# Patient Record
Sex: Male | Born: 1945
Health system: Southern US, Community
[De-identification: ages and names within clinical notes are randomized; demographics above are authoritative.]

## PROBLEM LIST (undated history)

## (undated) DIAGNOSIS — C4491 Basal cell carcinoma of skin, unspecified: Secondary | ICD-10-CM

## (undated) DIAGNOSIS — K5790 Diverticulosis of intestine, part unspecified, without perforation or abscess without bleeding: Secondary | ICD-10-CM

## (undated) DIAGNOSIS — R040 Epistaxis: Secondary | ICD-10-CM

## (undated) DIAGNOSIS — M1990 Unspecified osteoarthritis, unspecified site: Secondary | ICD-10-CM

## (undated) DIAGNOSIS — I10 Essential (primary) hypertension: Secondary | ICD-10-CM

## (undated) DIAGNOSIS — C61 Malignant neoplasm of prostate: Secondary | ICD-10-CM

## (undated) DIAGNOSIS — N200 Calculus of kidney: Secondary | ICD-10-CM

## (undated) DIAGNOSIS — K469 Unspecified abdominal hernia without obstruction or gangrene: Secondary | ICD-10-CM

## (undated) DIAGNOSIS — G459 Transient cerebral ischemic attack, unspecified: Secondary | ICD-10-CM

## (undated) DIAGNOSIS — E7800 Pure hypercholesterolemia, unspecified: Secondary | ICD-10-CM

## (undated) HISTORY — DX: Essential (primary) hypertension: I10

## (undated) HISTORY — DX: Calculus of kidney: N20.0

## (undated) HISTORY — DX: Diverticulosis of intestine, part unspecified, without perforation or abscess without bleeding: K57.90

## (undated) HISTORY — DX: Pure hypercholesterolemia, unspecified: E78.00

## (undated) HISTORY — DX: Malignant neoplasm of prostate: C61

---

## 1992-10-19 HISTORY — PX: CYSTOSCOPY W/ STONE MANIPULATION: SHX1427

## 1993-10-19 HISTORY — PX: KIDNEY STONE SURGERY: SHX686

## 2002-01-06 ENCOUNTER — Emergency Department (HOSPITAL_COMMUNITY): Admission: EM | Admit: 2002-01-06 | Discharge: 2002-01-06 | Payer: Self-pay

## 2003-10-20 DIAGNOSIS — G459 Transient cerebral ischemic attack, unspecified: Secondary | ICD-10-CM

## 2003-10-20 HISTORY — DX: Transient cerebral ischemic attack, unspecified: G45.9

## 2008-08-23 ENCOUNTER — Ambulatory Visit: Payer: Self-pay

## 2008-09-03 ENCOUNTER — Ambulatory Visit: Payer: Self-pay

## 2008-09-03 ENCOUNTER — Encounter: Payer: Self-pay | Admitting: Family Medicine

## 2008-09-03 ENCOUNTER — Ambulatory Visit: Payer: Self-pay | Admitting: Surgery

## 2008-10-19 DIAGNOSIS — C61 Malignant neoplasm of prostate: Secondary | ICD-10-CM

## 2008-10-19 HISTORY — DX: Malignant neoplasm of prostate: C61

## 2009-01-07 ENCOUNTER — Ambulatory Visit: Payer: Self-pay | Admitting: Gastroenterology

## 2009-02-04 ENCOUNTER — Ambulatory Visit: Payer: Self-pay | Admitting: Gastroenterology

## 2009-02-04 ENCOUNTER — Encounter: Payer: Self-pay | Admitting: Gastroenterology

## 2009-02-07 ENCOUNTER — Encounter: Payer: Self-pay | Admitting: Gastroenterology

## 2009-09-18 HISTORY — PX: BLADDER STONE REMOVAL: SHX568

## 2009-10-04 ENCOUNTER — Ambulatory Visit (HOSPITAL_COMMUNITY): Admission: RE | Admit: 2009-10-04 | Discharge: 2009-10-04 | Payer: Self-pay | Admitting: Urology

## 2009-10-19 HISTORY — PX: VIDEO ASSISTED THORACOSCOPY (VATS)/WEDGE RESECTION: SHX6174

## 2009-10-23 ENCOUNTER — Ambulatory Visit (HOSPITAL_COMMUNITY): Admission: RE | Admit: 2009-10-23 | Discharge: 2009-10-23 | Payer: Self-pay | Admitting: Urology

## 2009-10-24 ENCOUNTER — Ambulatory Visit: Payer: Self-pay | Admitting: Thoracic Surgery

## 2009-10-28 ENCOUNTER — Ambulatory Visit: Admission: RE | Admit: 2009-10-28 | Discharge: 2009-10-28 | Payer: Self-pay | Admitting: Thoracic Surgery

## 2009-11-01 ENCOUNTER — Encounter: Payer: Self-pay | Admitting: Thoracic Surgery

## 2009-11-01 ENCOUNTER — Inpatient Hospital Stay (HOSPITAL_COMMUNITY): Admission: RE | Admit: 2009-11-01 | Discharge: 2009-11-05 | Payer: Self-pay | Admitting: Thoracic Surgery

## 2009-11-01 ENCOUNTER — Ambulatory Visit: Payer: Self-pay | Admitting: Thoracic Surgery

## 2009-11-12 ENCOUNTER — Encounter: Admission: RE | Admit: 2009-11-12 | Discharge: 2009-11-12 | Payer: Self-pay | Admitting: Thoracic Surgery

## 2009-11-12 ENCOUNTER — Ambulatory Visit: Payer: Self-pay | Admitting: Thoracic Surgery

## 2009-12-03 ENCOUNTER — Encounter: Admission: RE | Admit: 2009-12-03 | Discharge: 2009-12-03 | Payer: Self-pay | Admitting: Thoracic Surgery

## 2009-12-03 ENCOUNTER — Ambulatory Visit: Payer: Self-pay | Admitting: Thoracic Surgery

## 2010-01-14 ENCOUNTER — Ambulatory Visit: Payer: Self-pay | Admitting: Thoracic Surgery

## 2010-01-14 ENCOUNTER — Encounter: Admission: RE | Admit: 2010-01-14 | Discharge: 2010-01-14 | Payer: Self-pay | Admitting: Thoracic Surgery

## 2010-01-17 HISTORY — PX: PROSTATECTOMY: SHX69

## 2010-01-22 ENCOUNTER — Inpatient Hospital Stay (HOSPITAL_COMMUNITY): Admission: RE | Admit: 2010-01-22 | Discharge: 2010-01-23 | Payer: Self-pay | Admitting: Urology

## 2010-01-22 ENCOUNTER — Encounter (INDEPENDENT_AMBULATORY_CARE_PROVIDER_SITE_OTHER): Payer: Self-pay | Admitting: Urology

## 2010-04-28 ENCOUNTER — Ambulatory Visit: Payer: Self-pay | Admitting: Thoracic Surgery

## 2010-04-28 ENCOUNTER — Encounter: Admission: RE | Admit: 2010-04-28 | Discharge: 2010-04-28 | Payer: Self-pay | Admitting: Thoracic Surgery

## 2010-11-09 ENCOUNTER — Encounter: Payer: Self-pay | Admitting: Thoracic Surgery

## 2011-01-04 LAB — BASIC METABOLIC PANEL
BUN: 15 mg/dL (ref 6–23)
GFR calc non Af Amer: >60 mL/min (ref >60)
Sodium: 132 mEq/L — ABNORMAL LOW (ref 135–145)

## 2011-01-04 LAB — TYPE AND SCREEN: ABO/RH(D): O POS

## 2011-01-04 LAB — BLOOD GAS, ARTERIAL
O2 Saturation: 92.4 %
pCO2 arterial: 35.8 mmHg (ref 35.0–45.0)

## 2011-01-04 LAB — PROTIME-INR
INR: 0.95 (ref 0.00–1.49)
Prothrombin Time: 12.6 seconds (ref 11.6–15.2)

## 2011-01-04 LAB — CBC
HCT: 39.8 % (ref 39.0–52.0)
MCHC: 34.3 g/dL (ref 30.0–36.0)
MCHC: 34.3 g/dL (ref 30.0–36.0)
MCV: 96.1 fL (ref 78.0–100.0)
RBC: 4.14 MIL/uL — ABNORMAL LOW (ref 4.22–5.81)
RDW: 13.6 % (ref 11.5–15.5)
RDW: 13.7 % (ref 11.5–15.5)
WBC: 7.2 10*3/uL (ref 4.0–10.5)

## 2011-01-04 LAB — COMPREHENSIVE METABOLIC PANEL
ALT: 22 U/L (ref 0–53)
AST: 21 U/L (ref 0–37)
Albumin: 4 g/dL (ref 3.5–5.2)
CO2: 27 mEq/L (ref 19–32)
Calcium: 10.2 mg/dL (ref 8.4–10.5)
Chloride: 102 mEq/L (ref 96–112)
GFR calc non Af Amer: >60 mL/min (ref >60)
Potassium: 4.5 mEq/L (ref 3.5–5.1)
Total Bilirubin: 0.3 mg/dL (ref 0.3–1.2)
Total Protein: 7.1 g/dL (ref 6.0–8.3)

## 2011-01-04 LAB — POCT I-STAT 3, ART BLOOD GAS (G3+)
Bicarbonate: 24.8 mEq/L — ABNORMAL HIGH (ref 20.0–24.0)
O2 Saturation: 93 %
pH, Arterial: 7.353 (ref 7.350–7.450)

## 2011-01-04 LAB — GLUCOSE, CAPILLARY: Glucose-Capillary: 100 mg/dL — ABNORMAL HIGH (ref 70–99)

## 2011-01-04 LAB — URINALYSIS, ROUTINE W REFLEX MICROSCOPIC
Hgb urine dipstick: NEGATIVE
Specific Gravity, Urine: 1.021 (ref 1.005–1.030)

## 2011-01-05 LAB — CBC
HCT: 33.6 % — ABNORMAL LOW (ref 39.0–52.0)
Platelets: 116 10*3/uL — ABNORMAL LOW (ref 150–400)
RBC: 3.54 MIL/uL — ABNORMAL LOW (ref 4.22–5.81)
RDW: 13.7 % (ref 11.5–15.5)
WBC: 10.1 10*3/uL (ref 4.0–10.5)

## 2011-01-05 LAB — COMPREHENSIVE METABOLIC PANEL
AST: 31 U/L (ref 0–37)
Albumin: 3 g/dL — ABNORMAL LOW (ref 3.5–5.2)
Alkaline Phosphatase: 56 U/L (ref 39–117)
Total Bilirubin: 0.5 mg/dL (ref 0.3–1.2)
Total Protein: 5.3 g/dL — ABNORMAL LOW (ref 6.0–8.3)

## 2011-01-05 LAB — BASIC METABOLIC PANEL
Calcium: 8.8 mg/dL (ref 8.4–10.5)
Creatinine, Ser: 1.09 mg/dL (ref 0.4–1.5)
GFR calc Af Amer: >60 mL/min (ref >60)
Sodium: 132 mEq/L — ABNORMAL LOW (ref 135–145)

## 2011-01-07 LAB — CBC
Hemoglobin: 13.9 g/dL (ref 13.0–17.0)
MCHC: 33.7 g/dL (ref 30.0–36.0)
MCV: 97.6 fL (ref 78.0–100.0)
RBC: 4.21 MIL/uL — ABNORMAL LOW (ref 4.22–5.81)
WBC: 9.1 10*3/uL (ref 4.0–10.5)

## 2011-01-07 LAB — BASIC METABOLIC PANEL
CO2: 28 mEq/L (ref 19–32)
Chloride: 108 mEq/L (ref 96–112)
GFR calc Af Amer: >60 mL/min (ref >60)
Potassium: 4 mEq/L (ref 3.5–5.1)
Sodium: 140 mEq/L (ref 135–145)

## 2011-01-07 LAB — HEMOGLOBIN AND HEMATOCRIT, BLOOD
HCT: 37.1 % — ABNORMAL LOW (ref 39.0–52.0)
HCT: 41 % (ref 39.0–52.0)
Hemoglobin: 13.8 g/dL (ref 13.0–17.0)

## 2011-01-07 LAB — TYPE AND SCREEN

## 2011-01-19 LAB — CBC
MCHC: 33.2 g/dL (ref 30.0–36.0)
Platelets: 231 10*3/uL (ref 150–400)
RBC: 4.88 MIL/uL (ref 4.22–5.81)
WBC: 8.9 10*3/uL (ref 4.0–10.5)

## 2011-01-19 LAB — PROTIME-INR
INR: 1.01 (ref 0.00–1.49)
Prothrombin Time: 13.2 seconds (ref 11.6–15.2)

## 2011-01-19 LAB — BASIC METABOLIC PANEL
BUN: 16 mg/dL (ref 6–23)
CO2: 27 mEq/L (ref 19–32)
Calcium: 9.8 mg/dL (ref 8.4–10.5)
Creatinine, Ser: 0.84 mg/dL (ref 0.4–1.5)
GFR calc Af Amer: >60 mL/min (ref >60)

## 2011-03-03 NOTE — Letter (Signed)
April 28, 2010     Re:  Timothy Duran, SIMMER                  DOB:  25-Oct-1945     The patient came for final followup today.  His blood pressure was  118/76, pulse 75, respirations 18, sats were 95%.  He is doing well  overall.  His chest x-Damacio showed normal postoperative changes.  We  removed his hamartoma.  I have released him back to his medical doctor.  We will see him again if he has any future problems.   Ines Bloomer, M.D.  Electronically Signed   DPB/MEDQ  D:  04/28/2010  T:  04/29/2010  Job:  [id number]

## 2011-03-03 NOTE — Assessment & Plan Note (Signed)
OFFICE VISIT   Timothy Duran, Timothy Duran  DOB:  May 17, 1946                                       09/03/2008  ZOXWR#:[id number]   REASON FOR VISIT:  TIA, leg pain.   HISTORY:  This is a 65 year old gentleman I am seeing for evaluation of  leg pain and a history of TIA.  The patient states that on October 25th  he had an episode where he came dizzy, both eyes went blurry and he lost  his vision.  He described his surroundings as spinning.  He developed  nausea and was diaphoretic.  This lasted for 1-2 minutes and then  resolved.  The patient states that he had a CT scan that showed that he  had a stroke, the timing of which could not be determined.   The patient also states that he has leg pain.  He describes his leg pain  as occurring at rest.  It is alleviated with walking.   The patient has a history of hypertension and hypercholesterolemia, both  which he is taking medications for.  He is also taking Plavix.   REVIEW OF SYSTEMS:  GENERAL:  Positive for weight gain.  CARDIAC:  Negative.  PULMONARY:  Negative.  GI:  Negative.  GU:  Positive for burning with urination and frequent urination.  VASCULAR:  History of mini-stroke.  NEURO:  Positive for dizziness.  ORTHO:  Positive for joint pain.  PSYCH:  Negative.  ENT:  Negative.  HEME:  Negative.   PAST MEDICAL HISTORY:  Hypertension, hypercholesterolemia, history of  kidney stone.   FAMILY HISTORY:  Negative for cardiovascular disease at a young age.   SOCIAL HISTORY:  He is married with 2 children, he is retired.  He  smokes approximately 1/2 pack a day.  He does not drink alcohol.   MEDICATIONS:  Plavix, Azor and aspirin.   ALLERGIES:  Penicillin.   PHYSICAL EXAMINATION:  Vital Signs:  Blood pressure is 134/81, heart  rate 84.  General:  He is well-appearing, no distress.  HEENT:  Normocephalic, atraumatic.  Pupils equal, round and reactive.  Neck:  Supple.  No JVD.  No carotid bruits.  Cardiovascular:   Regular rate and  rhythm.  No murmurs, rubs or gallops.  Pulmonary:  Lungs are clear  bilaterally.  Abdomen:  Soft, nontender.  No pulsatile mass.  Extremities:  Are warm and well-perfused.  He has palpable pedal pulses.  Neuro:  Cranial nerves II-XII are grossly intact.  Psych:  He is alert  and oriented x3.   DIAGNOSTIC STUDIES:  The patient had ankle brachial indices performed  today.  This reveals an ankle brachial index of 1.0 on the left and 1.0  on the right.  I have also reviewed his carotid Doppler study, which was  faxed to me from [place], which reveals 0-39% carotid disease  bilaterally.   ASSESSMENT/PLAN:  Status post transient ischemic attack and leg pain.   Plan:  I discussed with the patient that by Doppler studies he has no  evidence of arterial insufficiency in his legs and he has no evidence of  carotid disease.  The logy of his stroke is most likely hypertension.  We discussed treating this medically.  I also told him that his leg pain  was not related to peripheral artery disease as he has palpable pedal  pulses and  a normal ankle brachial indices.   Jorge Ny, MD  Electronically Signed   VWB/MEDQ  D:  09/03/2008  T:  09/04/2008  Job:  [id number]   cc:   Bennie Pierini, P.A.

## 2011-03-03 NOTE — Assessment & Plan Note (Signed)
OFFICE VISIT   Timothy Duran, Timothy Duran  DOB:  October 04, 1946                                        January 14, 2010  CHART #:  [id number]   The patient came for followup today.  In January, he had resection of a  right lung nodule that turned out to be a pulmonary hamartoma.  His  chest x-Amarri still shows reaction where his nodule was released, but it  appears to be improving.  He is going to have a robotic prostatectomy  tomorrow by Dr. Heloise Purpura.  His blood pressure was 132/71, pulse 68,  respirations 18, and sats were 95%.   Timothy Duran, M.D.  Electronically Signed   DPB/MEDQ  D:  01/14/2010  T:  01/15/2010  Job:  [id number]

## 2011-03-03 NOTE — Letter (Signed)
November 12, 2009   Courtney Paris, MD  [phone] [address]  [place], [place] [address]   Re:  FOCH, ROSENWALD                  DOB:  [birth year]   Dear Londell Moh:   I saw the patient back today after resecting his right upper lobe  lesion.  He had some hematoma in his chest wall, but otherwise he was  doing well.  His chest x-Matis was normal.  He had a hamartoma, which we  were able to resect without any problems.  We gave him a refill for  Percocet 5/325, #60.  His blood pressure was 100/50, pulse 88,  respirations 18, and sats were 97%.  I will see him back again in 2  weeks with another chest x-Jennifer.   Sincerely,   Ines Bloomer, M.D.  Electronically Signed   DPB/MEDQ  D:  11/12/2009  T:  11/13/2009  Job:  [id number]   cc:   Ernestina Penna, M.D.

## 2011-03-03 NOTE — Assessment & Plan Note (Signed)
OFFICE VISIT   Timothy Duran, Timothy Duran  DOB:  [birth year]                                        April 28, 2010  CHART #:  [id number]   The patient came for final followup today.  His blood pressure was  118/76, pulse 75, respirations 18, sats were 95%.  He is doing well  overall.  His chest x-Earon showed normal postoperative changes.  We  removed his hamartoma.  I have released him back to his medical doctor.  We will see him again if he has any future problems.   Ines Bloomer, M.D.  Electronically Signed   DPB/MEDQ  D:  04/28/2010  T:  05/02/2010  Job:  [id number]

## 2011-03-03 NOTE — Letter (Signed)
October 24, 2009   Courtney Paris, MD  [phone]  [place], [place] [address]   Re:  Timothy Duran, Timothy Duran              DOB:  12-03-1945   Dear Londell Moh:   I appreciate the opportunity of seeing the patient.  This 65 year old  smoker was found to have an elevated PSA and also had found to have a 2-  cm bladder stone.  He underwent workup and was found to have prostate  cancer, Gleason's 3+4 adenocarcinoma of the prostate.  He is a long-time  smoker.  He has had no hemoptysis, fever, chills, or excessive sputum.  On the CT scan, there was a 2.2-cm lesion in the right upper lobe that  was noncalcific.  PIPIDA scan was done which showed an uptake of 1.6  which is more in the benign side or low grade cancer that was thought to  be possibly a hamartoma.  A x-Daved taken in February 2010 shows this  lesion in the right upper lobe.  This was done at [facility].   ALLERGIES:  He is allergic to penicillin.   MEDICATIONS:  He takes Plavix 75 mg a day and aspirin 81 mg a day.  He  was on Azor.   PAST MEDICAL HISTORY:  He has hypertension.   FAMILY HISTORY:  Noncontributory.   SOCIAL HISTORY:  He is married.  He has 2 children, comes in today with  his wife and his children.  He has been married for 46 years.  He quit  smoking in December 2010.  Does not drink alcohol on a regular basis.   REVIEW OF SYSTEMS:  VITAL SIGNS:  He is 5 feet 8 inches, 172 pounds.  GENERAL:  His weight has been stable.  CARDIAC:  No angina or atrial fibrillation.  PULMONARY:  No hemoptysis, fever, chills, or excessive sputum.  GI:  No nausea, vomiting, constipation, or diarrhea.  GU:  See history of present illness.  VASCULAR:  He had a previous TIA and has put on Plavix.  No claudication  or DVT.  NEUROLOGICAL:  No dizziness, headaches, blackout, or seizures.  MUSCULOSKELETAL:  Arthritis.  PSYCHIATRIC:  No depression or nervous.  EYE/ENT:  No changes in eyesight or hearing.  HEMATOLOGICAL:  No problems with bleeding, clotting disorders, or  anemia.   PHYSICAL EXAMINATION:  Vital Signs:  His blood pressure is 130/86, pulse  84, respirations 18, and sats were 96%.  Head, Eyes, Ears, Nose, and  Throat:  Unremarkable.  Neck:  Supple without thyromegaly.  There is no  supraclavicular or axillary adenopathy.  Chest:  Clear to auscultation  and percussion.  Heart:  Regular sinus rhythm.  No murmurs.  Abdomen:  Soft.  There is no hepatosplenomegaly.  Extremities:  Pulses are 2+.  There is no clubbing or edema.  Neurologic:  He is oriented x3.  Sensory  and motor intact.  Cranial nerves intact.   I feel that this is probably a pulmonary hamartoma or it could be a low-  grade cancer such as a carcinoid.  Whatever the case has been, 2.2 size  needs to be removed as there is some evidence of degeneration of  hamartomas at this size.  I have scheduled him for November 01, 2009 for  a right VATS resection of this.  We will get full set of pulmonary  function tests prior to his surgery.  I appreciate the opportunity of  seeing the patient.   Sincerely,   Ines Bloomer, M.D.  Electronically Signed   DPB/MEDQ  D:  10/24/2009  T:  10/25/2009  Job:  [id number]   cc:   Ernestina Penna, M.D.

## 2011-03-03 NOTE — Assessment & Plan Note (Signed)
OFFICE VISIT   Timothy Duran, Timothy Duran  DOB:  [birth year]                                        December 03, 2009  CHART #:  [id number]   HISTORY:  The patient is a 65 year old gentleman who is status post  right video-assisted thoracoscopy with wedge resection of a right upper  lobe lung mass on November 01, 2009, which has subsequently returned a  pathological diagnosis of hamartoma.  He is seen on routine followup on  today's date.  His only significant symptom currently is some mild  numbness in the right anterior chest just below the pectoralis region.  He denies shortness of breath.  He denies fevers, chills, or other  constitutional symptoms.   Chest x-[name] was obtained on today's date.  It shows stable appearance  with no significant new findings.   PHYSICAL EXAMINATION:  Vital Signs:  Blood pressure 118/60, pulse 96,  respirations 16, and oxygen saturation is 95% on room air.  General:  A  well-developed adult male in no acute distress.  Pulmonary:  Clear  breath sounds throughout.  Cardiac:  Regular rate and rhythm.  Chest:  Incision is inspected.  There is some moderate local swelling in the  region of the thoracotomy incision, but no evidence of infection.  There  is no erythema.  No drainage.  It is minimally tender to palpation.   ASSESSMENT:  The patient is making an excellent ongoing recovery.  There  are no significant current issues other than the routine postoperative  changes.  We will need to continue to observe the incision and we will  see the patient again in 6 weeks with a repeat chest x-[name].  At that  time if there are no significant issues, he may be discharged from  further followup, but that is tentative.   [name], P.A.-C.   [name]  D:  12/03/2009  T:  12/04/2009  Job:  [id number]

## 2011-04-20 ENCOUNTER — Other Ambulatory Visit: Payer: Self-pay | Admitting: Diagnostic Neuroimaging

## 2011-04-20 DIAGNOSIS — R42 Dizziness and giddiness: Secondary | ICD-10-CM

## 2011-04-29 ENCOUNTER — Ambulatory Visit
Admission: RE | Admit: 2011-04-29 | Discharge: 2011-04-29 | Disposition: A | Discharge status: Home / Self Care | Payer: Medicare Other | Source: Ambulatory Visit | Attending: Diagnostic Neuroimaging | Admitting: Diagnostic Neuroimaging

## 2011-04-29 DIAGNOSIS — R42 Dizziness and giddiness: Secondary | ICD-10-CM

## 2011-04-29 MED ORDER — GADOBENATE DIMEGLUMINE 529 MG/ML IV SOLN
17.0000 mL | Freq: Once | INTRAVENOUS | Status: AC | PRN
  Administered 2011-04-29: 17 mL via INTRAVENOUS

## 2013-05-17 ENCOUNTER — Other Ambulatory Visit: Payer: Self-pay | Admitting: *Deleted

## 2013-05-17 MED ORDER — OLMESARTAN MEDOXOMIL-HCTZ 20-12.5 MG PO TABS: 1.0000 | ORAL_TABLET | Freq: Every day | ORAL | Status: DC

## 2013-06-12 ENCOUNTER — Other Ambulatory Visit: Payer: Self-pay

## 2013-06-12 ENCOUNTER — Ambulatory Visit (INDEPENDENT_AMBULATORY_CARE_PROVIDER_SITE_OTHER): Payer: Medicare Other

## 2013-06-12 ENCOUNTER — Ambulatory Visit (INDEPENDENT_AMBULATORY_CARE_PROVIDER_SITE_OTHER): Payer: Medicare Other | Admitting: Family Medicine

## 2013-06-12 VITALS — BP 123/78 | HR 93 | Temp 97.6°F | Ht 68.0 in | Wt 186.0 lb

## 2013-06-12 DIAGNOSIS — Z85118 Personal history of other malignant neoplasm of bronchus and lung: Secondary | ICD-10-CM

## 2013-06-12 DIAGNOSIS — D171 Benign lipomatous neoplasm of skin and subcutaneous tissue of trunk: Secondary | ICD-10-CM

## 2013-06-12 DIAGNOSIS — D1779 Benign lipomatous neoplasm of other sites: Secondary | ICD-10-CM

## 2013-06-12 NOTE — Patient Instructions (Signed)
Lipoma A lipoma is a noncancerous (benign) tumor composed of fat cells. They are usually found under the skin (subcutaneous). A lipoma may occur in any tissue of the body that contains fat. Common areas for lipomas to appear include the back, shoulders, buttocks, and thighs. Lipomas are a very common soft tissue growth. They are soft and grow slowly. Most problems caused by a lipoma depend on where it is growing. DIAGNOSIS  A lipoma can be diagnosed with a physical exam. These tumors rarely become cancerous, but radiographic studies can help determine this for certain. Studies used may include:  Computerized X-Gibran scans (CT or CAT scan).  Computerized magnetic scans (MRI). TREATMENT  Small lipomas that are not causing problems may be watched. If a lipoma continues to enlarge or causes problems, removal is often the best treatment. Lipomas can also be removed to improve appearance. Surgery is done to remove the fatty cells and the surrounding capsule. Most often, this is done with medicine that numbs the area (local anesthetic). The removed tissue is examined under a microscope to make sure it is not cancerous. Keep all follow-up appointments with your caregiver. SEEK MEDICAL CARE IF:   The lipoma becomes larger or hard.  The lipoma becomes painful, red, or increasingly swollen. These could be signs of infection or a more serious condition. Document Released: 09/25/2002 Document Revised: 12/28/2011 Document Reviewed: 03/07/2010 [employer] Patient Information 2014 [employer].  

## 2013-06-12 NOTE — Progress Notes (Signed)
  Subjective:    Patient ID: Timothy Duran, male    DOB: 03/24/1946, 67 y.o.   MRN: [id number]  HPI This 67 y.o. male presents for evaluation of mass on his right shoulder.  He has had this for  A few months or at least his wife has noticed this for over a few months now.  There is no pain. He has hx of lung mass that was surgically removed and was benign.    Review of Systems C/o mass right posterior shoulder. No chest pain, SOB, HA, dizziness, vision change, N/V, diarrhea, constipation, dysuria, urinary urgency or frequency, myalgias, arthralgias or rash.     Objective:   Physical Exam Vital signs noted  Well developed well nourished male.  HEENT - Head atraumatic Normocephalic                Eyes - PERRLA, Conjuctiva - clear Sclera- Clear EOMI                Ears - EAC's Wnl TM's Wnl Gross Hearing WNL                Nose - Nares patent                 Throat - oropharanx wnl Respiratory - Lungs CTA bilateral Cardiac - RRR S1 and S2 without murmur GI - Abdomen soft Nontender and bowel sounds active x 4 Extremities - No edema. Neuro - Grossly intact. Skin - 8cm diameter lipoma mass on posterior right shoulder.    CXR - No masses seen. Assessment & Plan:  Hx of cancer of lung - Plan: DG Chest 2 View  Lipoma of back - Plan: [place] Misc Soft Tissue

## 2013-06-13 ENCOUNTER — Other Ambulatory Visit: Payer: Medicare Other

## 2013-06-13 ENCOUNTER — Ambulatory Visit
Admission: RE | Admit: 2013-06-13 | Discharge: 2013-06-13 | Disposition: A | Discharge status: Home / Self Care | Payer: Medicare Other | Source: Ambulatory Visit | Attending: Family Medicine | Admitting: Family Medicine

## 2013-06-13 DIAGNOSIS — D171 Benign lipomatous neoplasm of skin and subcutaneous tissue of trunk: Secondary | ICD-10-CM

## 2013-06-16 ENCOUNTER — Telehealth: Payer: Self-pay | Admitting: Nurse Practitioner

## 2013-06-16 NOTE — Telephone Encounter (Signed)
Left message that report not ready yet

## 2013-06-16 NOTE — Telephone Encounter (Signed)
Where did have done- no report in computer

## 2013-08-24 ENCOUNTER — Other Ambulatory Visit: Payer: Self-pay

## 2013-08-29 ENCOUNTER — Ambulatory Visit (HOSPITAL_COMMUNITY)
Admission: RE | Admit: 2013-08-29 | Discharge: 2013-08-29 | Disposition: A | Discharge status: Home / Self Care | Payer: Medicare Other | Source: Ambulatory Visit | Attending: Nurse Practitioner | Admitting: Nurse Practitioner

## 2013-08-29 ENCOUNTER — Other Ambulatory Visit: Payer: Self-pay | Admitting: Nurse Practitioner

## 2013-08-29 ENCOUNTER — Other Ambulatory Visit: Payer: Self-pay

## 2013-08-29 ENCOUNTER — Encounter: Payer: Self-pay | Admitting: Nurse Practitioner

## 2013-08-29 ENCOUNTER — Ambulatory Visit (INDEPENDENT_AMBULATORY_CARE_PROVIDER_SITE_OTHER): Payer: Medicare Other | Admitting: Nurse Practitioner

## 2013-08-29 VITALS — BP 130/80 | HR 99 | Temp 98.2°F | Ht 68.0 in | Wt 189.0 lb

## 2013-08-29 DIAGNOSIS — M7989 Other specified soft tissue disorders: Secondary | ICD-10-CM

## 2013-08-29 DIAGNOSIS — M79609 Pain in unspecified limb: Secondary | ICD-10-CM | POA: Insufficient documentation

## 2013-08-29 DIAGNOSIS — R52 Pain, unspecified: Secondary | ICD-10-CM

## 2013-08-29 NOTE — Progress Notes (Signed)
  Subjective:    Patient ID: Timothy Duran, male    DOB: 05-08-1946, 67 y.o.   MRN: [id number]  HPI  Patient in with daughter and wife- c/o knot on left lower leg- noticed last night but he said his shin has been hurting for several days.    Review of Systems  All other systems reviewed and are negative.       Objective:   Physical Exam  Constitutional: He appears well-developed and well-nourished.  Cardiovascular: Normal rate and normal heart sounds.   Pulmonary/Chest: Effort normal and breath sounds normal.  Musculoskeletal:  3cm mildly tender nonimmobile lesion left lateral lower leg.    BP 130/80  Pulse 99  Temp(Src) 98.2 F (36.8 C) (Oral)  Ht 5\' 8"  (1.727 m)  Wt 189 lb (85.73 kg)  BMI 28.74 kg/m2       Assessment & Plan:   1. Nodule of soft tissue    Orders Placed This Encounter  Procedures  . US Venous Img Lower Unilateral Left    Standing Status: Future     Number of Occurrences:      Standing Expiration Date: 10/29/2014    Order Specific Question:  Reason for Exam (SYMPTOM  OR DIAGNOSIS REQUIRED)    Answer:  left lower leg    Order Specific Question:  Preferred imaging location?    Answer:  [facility]   Elevate when sitting Will talk after test results  Mary-Margaret Daphine Deutscher, FNP

## 2013-08-29 NOTE — Addendum Note (Signed)
Addended by: Bennie Pierini on: 08/29/2013 12:35 PM   Modules accepted: Orders

## 2013-08-31 ENCOUNTER — Ambulatory Visit (INDEPENDENT_AMBULATORY_CARE_PROVIDER_SITE_OTHER): Payer: Medicare Other | Admitting: Nurse Practitioner

## 2013-08-31 ENCOUNTER — Encounter: Payer: Self-pay | Admitting: Nurse Practitioner

## 2013-08-31 VITALS — BP 125/77 | HR 68 | Temp 98.0°F | Ht 68.0 in | Wt 188.0 lb

## 2013-08-31 DIAGNOSIS — I10 Essential (primary) hypertension: Secondary | ICD-10-CM | POA: Insufficient documentation

## 2013-08-31 DIAGNOSIS — G459 Transient cerebral ischemic attack, unspecified: Secondary | ICD-10-CM

## 2013-08-31 DIAGNOSIS — Z125 Encounter for screening for malignant neoplasm of prostate: Secondary | ICD-10-CM

## 2013-08-31 MED ORDER — OLMESARTAN MEDOXOMIL-HCTZ 20-12.5 MG PO TABS: 1.0000 | ORAL_TABLET | Freq: Every day | ORAL | Status: DC

## 2013-08-31 MED ORDER — CLOPIDOGREL BISULFATE 75 MG PO TABS: 75.0000 mg | ORAL_TABLET | Freq: Once | ORAL | Status: DC

## 2013-08-31 NOTE — Progress Notes (Signed)
Subjective:    Patient ID: Timothy Duran, male    DOB: 09/28/1946, 67 y.o.   MRN: [id number]  Hypertension This is a chronic problem. The current episode started more than 1 year ago. The problem is unchanged. The problem is controlled. Pertinent negatives include no anxiety, blurred vision, chest pain, headaches, malaise/fatigue, neck pain, orthopnea, palpitations, shortness of breath or sweats. There are no associated agents to hypertension. Risk factors for coronary artery disease include dyslipidemia, family history and male gender. Past treatments include angiotensin blockers and diuretics. The current treatment provides moderate improvement. Compliance problems include diet and exercise.  Hypertensive end-organ damage includes CVA.  Hyperlipidemia This is a chronic problem. The current episode started more than 1 year ago. The problem is uncontrolled. He has no history of diabetes, hypothyroidism or obesity. Pertinent negatives include no chest pain or shortness of breath. Treatments tried: patient was suppose to start on lipitor but never did. Compliance problems include adherence to diet and adherence to exercise.        Review of Systems  Constitutional: Negative for malaise/fatigue.  Eyes: Negative for blurred vision.  Respiratory: Negative for shortness of breath.   Cardiovascular: Negative for chest pain, palpitations and orthopnea.  Musculoskeletal: Negative for neck pain.  Neurological: Negative for headaches.       Objective:   Physical Exam  Constitutional: He is oriented to person, place, and time. He appears well-developed and well-nourished.  HENT:  Head: Normocephalic.  Right Ear: External ear normal.  Left Ear: External ear normal.  Nose: Nose normal.  Mouth/Throat: Oropharynx is clear and moist.  Eyes: EOM are normal. Pupils are equal, round, and reactive to light.  Neck: Normal range of motion. Neck supple. No JVD present. No thyromegaly present.  Cardiovascular:  Normal rate, regular rhythm, normal heart sounds and intact distal pulses.  Exam reveals no gallop and no friction rub.   No murmur heard. Pulmonary/Chest: Effort normal and breath sounds normal. No respiratory distress. He has no wheezes. He has no rales. He exhibits no tenderness.  Abdominal: Soft. Bowel sounds are normal. He exhibits no mass. There is no tenderness.  Genitourinary: Prostate normal and penis normal.  Musculoskeletal: Normal range of motion. He exhibits no edema.  Lymphadenopathy:    He has no cervical adenopathy.  Neurological: He is alert and oriented to person, place, and time. No cranial nerve deficit.  Skin: Skin is warm and dry.  Psychiatric: He has a normal mood and affect. His behavior is normal. Judgment and thought content normal.    BP 125/77  Pulse 68  Temp(Src) 98 F (36.7 C) (Oral)  Ht 5\' 8"  (1.727 m)  Wt 188 lb (85.276 kg)  BMI 28.59 kg/m2       Assessment & Plan:   1. Hypertension   2. TIA (transient ischemic attack)   3. Prostate cancer screening    Orders Placed This Encounter  Procedures  . CMP14+EGFR  . NMR, lipoprofile  . PSA, total and free   Meds ordered this encounter  Medications  . clopidogrel (PLAVIX) 75 MG tablet    Sig: Take 1 tablet (75 mg total) by mouth once.    Dispense:  90 tablet    Refill:  1    Order Specific Question:  Supervising Provider    Answer:  Ernestina Penna [[id number]]  . olmesartan-hydrochlorothiazide (BENICAR HCT) 20-12.5 MG per tablet    Sig: Take 1 tablet by mouth daily.    Dispense:  90 tablet  Refill:  1    Order Specific Question:  Supervising Provider    Answer:  Deborra Medina    Continue all meds Labs pending Diet and exercise encouraged Health maintenance reviewed Follow up in 3 months  Mary-Margaret Daphine Deutscher, FNP

## 2013-08-31 NOTE — Patient Instructions (Signed)
Health Maintenance, Males A healthy lifestyle and preventative care can promote health and wellness.  Maintain regular health, dental, and eye exams.  Eat a healthy diet. Foods like vegetables, fruits, whole grains, low-fat dairy products, and lean protein foods contain the nutrients you need without too many calories. Decrease your intake of foods high in solid fats, added sugars, and salt. Get information about a proper diet from your caregiver, if necessary.  Regular physical exercise is one of the most important things you can do for your health. Most adults should get at least 150 minutes of moderate-intensity exercise (any activity that increases your heart rate and causes you to sweat) each week. In addition, most adults need muscle-strengthening exercises on 2 or more days a week.   Maintain a healthy weight. The body mass index (BMI) is a screening tool to identify possible weight problems. It provides an estimate of body fat based on height and weight. Your caregiver can help determine your BMI, and can help you achieve or maintain a healthy weight. For adults 20 years and older:  A BMI below 18.5 is considered underweight.  A BMI of 18.5 to 24.9 is normal.  A BMI of 25 to 29.9 is considered overweight.  A BMI of 30 and above is considered obese.  Maintain normal blood lipids and cholesterol by exercising and minimizing your intake of saturated fat. Eat a balanced diet with plenty of fruits and vegetables. Blood tests for lipids and cholesterol should begin at age 20 and be repeated every 5 years. If your lipid or cholesterol levels are high, you are over 50, or you are a high risk for heart disease, you may need your cholesterol levels checked more frequently.Ongoing high lipid and cholesterol levels should be treated with medicines, if diet and exercise are not effective.  If you smoke, find out from your caregiver how to quit. If you do not use tobacco, do not start.  Lung  cancer screening is recommended for adults aged 55 80 years who are at high risk for developing lung cancer because of a history of smoking. Yearly low-dose computed tomography (CT) is recommended for people who have at least a 30-pack-year history of smoking and are a current smoker or have quit within the past 15 years. A pack year of smoking is smoking an average of 1 pack of cigarettes a day for 1 year (for example: 1 pack a day for 30 years or 2 packs a day for 15 years). Yearly screening should continue until the smoker has stopped smoking for at least 15 years. Yearly screening should also be stopped for people who develop a health problem that would prevent them from having lung cancer treatment.  If you choose to drink alcohol, do not exceed 2 drinks per day. One drink is considered to be 12 ounces (355 mL) of beer, 5 ounces (148 mL) of wine, or 1.5 ounces (44 mL) of liquor.  Avoid use of street drugs. Do not share needles with anyone. Ask for help if you need support or instructions about stopping the use of drugs.  High blood pressure causes heart disease and increases the risk of stroke. Blood pressure should be checked at least every 1 to 2 years. Ongoing high blood pressure should be treated with medicines if weight loss and exercise are not effective.  If you are 45 to 69 years old, ask your caregiver if you should take aspirin to prevent heart disease.  Diabetes screening involves taking a blood   sample to check your fasting blood sugar level. This should be done once every 3 years, after age 45, if you are within normal weight and without risk factors for diabetes. Testing should be considered at a younger age or be carried out more frequently if you are overweight and have at least 1 risk factor for diabetes.  Colorectal cancer can be detected and often prevented. Most routine colorectal cancer screening begins at the age of 50 and continues through age 75. However, your caregiver may  recommend screening at an earlier age if you have risk factors for colon cancer. On a yearly basis, your caregiver may provide home test kits to check for hidden blood in the stool. Use of a small camera at the end of a tube, to directly examine the colon (sigmoidoscopy or colonoscopy), can detect the earliest forms of colorectal cancer. Talk to your caregiver about this at age 50, when routine screening begins. Direct examination of the colon should be repeated every 5 to 10 years through age 75, unless early forms of pre-cancerous polyps or small growths are found.  Hepatitis C blood testing is recommended for all people born from 1945 through 1965 and any individual with known risks for hepatitis C.  Healthy men should no longer receive prostate-specific antigen (PSA) blood tests as part of routine cancer screening. Consult with your caregiver about prostate cancer screening.  Testicular cancer screening is not recommended for adolescents or adult males who have no symptoms. Screening includes self-exam, caregiver exam, and other screening tests. Consult with your caregiver about any symptoms you have or any concerns you have about testicular cancer.  Practice safe sex. Use condoms and avoid high-risk sexual practices to reduce the spread of sexually transmitted infections (STIs).  Use sunscreen. Apply sunscreen liberally and repeatedly throughout the day. You should seek shade when your shadow is shorter than you. Protect yourself by wearing long sleeves, pants, a wide-brimmed hat, and sunglasses year round, whenever you are outdoors.  Notify your caregiver of new moles or changes in moles, especially if there is a change in shape or color. Also notify your caregiver if a mole is larger than the size of a pencil eraser.  A one-time screening for abdominal aortic aneurysm (AAA) and surgical repair of large AAAs by sound wave imaging (ultrasonography) is recommended for ages 65 to 75 years who are  current or former smokers.  Stay current with your immunizations. Document Released: 04/02/2008 Document Revised: 01/30/2013 Document Reviewed: 03/02/2011 [employer] Patient Information 2014 [employer].  

## 2013-09-02 LAB — CMP14+EGFR
ALT: 16 IU/L (ref 0–44)
AST: 15 IU/L (ref 0–40)
Albumin: 4.3 g/dL (ref 3.6–4.8)
Alkaline Phosphatase: 77 IU/L (ref 39–117)
BUN/Creatinine Ratio: 21 (ref 10–22)
BUN: 20 mg/dL (ref 8–27)
CO2: 28 mmol/L (ref 18–29)
Chloride: 102 mmol/L (ref 97–108)
GFR calc Af Amer: 95 mL/min/{1.73_m2} (ref >59)
Potassium: 5 mmol/L (ref 3.5–5.2)
Total Bilirubin: 0.4 mg/dL (ref 0.0–1.2)

## 2013-09-02 LAB — NMR, LIPOPROFILE
Cholesterol: 219 mg/dL — ABNORMAL HIGH (ref <200)
LDL Particle Number: 2267 nmol/L — ABNORMAL HIGH (ref <1000)
LDL Size: 20.5 nm — ABNORMAL LOW (ref >20.5)
LDLC SERPL CALC-MCNC: 149 mg/dL — ABNORMAL HIGH (ref <100)
LP-IR Score: 52 — ABNORMAL HIGH (ref <=45)

## 2013-09-02 LAB — PSA, TOTAL AND FREE
PSA, Free Pct: >10 %
PSA, Free: 0.01 ng/mL
PSA: <0.1 ng/mL (ref 0.0–4.0)

## 2013-09-04 ENCOUNTER — Other Ambulatory Visit: Payer: Self-pay | Admitting: Nurse Practitioner

## 2013-09-04 MED ORDER — ATORVASTATIN CALCIUM 40 MG PO TABS: 40.0000 mg | ORAL_TABLET | Freq: Every day | ORAL | Status: DC

## 2013-09-18 ENCOUNTER — Other Ambulatory Visit: Payer: Self-pay | Admitting: Nurse Practitioner

## 2013-10-19 HISTORY — PX: BASAL CELL CARCINOMA EXCISION: SHX1214

## 2014-01-31 ENCOUNTER — Ambulatory Visit (INDEPENDENT_AMBULATORY_CARE_PROVIDER_SITE_OTHER): Payer: Medicare Other

## 2014-01-31 ENCOUNTER — Ambulatory Visit (INDEPENDENT_AMBULATORY_CARE_PROVIDER_SITE_OTHER): Payer: Medicare Other | Admitting: Family Medicine

## 2014-01-31 VITALS — BP 121/82 | HR 74 | Temp 96.6°F | Ht 68.0 in | Wt 191.4 lb

## 2014-01-31 DIAGNOSIS — R29898 Other symptoms and signs involving the musculoskeletal system: Secondary | ICD-10-CM

## 2014-01-31 DIAGNOSIS — M542 Cervicalgia: Secondary | ICD-10-CM

## 2014-01-31 MED ORDER — HYDROCODONE-ACETAMINOPHEN 5-325 MG PO TABS: 1.0000 | ORAL_TABLET | Freq: Four times a day (QID) | ORAL | Status: DC | PRN

## 2014-01-31 MED ORDER — METHYLPREDNISOLONE (PAK) 4 MG PO TABS: ORAL_TABLET | ORAL | Status: DC

## 2014-01-31 NOTE — Progress Notes (Signed)
   Subjective:    Patient ID: Timothy Duran, male    DOB: 1946/06/06, 68 y.o.   MRN: [id number]  HPI  This 68 y.o. male presents for evaluation of left neck pain for 2 days.  He has difficulty moving his head And feels like he has a strain in his neck.  He has been having some right shoulder discomfort which is resolved.  He has been having some bilateral leg weakness.    Review of Systems C/o bilateral leg weakness and neck pain.   No chest pain, SOB, HA, dizziness, vision change, N/V, diarrhea, constipation, dysuria, urinary urgency or frequency, myalgias, arthralgias or rash.  Objective:   Physical Exam  Vital signs noted  Well developed well nourished male.  HEENT - Head atraumatic Normocephalic                Eyes - PERRLA, Conjuctiva - clear Sclera- Clear EOMI                Ears - EAC's Wnl TM's Wnl Gross Hearing WNL                Throat - oropharanx wnl Respiratory - Lungs CTA bilateral Cardiac - RRR S1 and S2 without murmur GI - Abdomen soft Nontender and bowel sounds active x 4 MS - TTP cervical spine left cervical paraspinous muscles and decreased ROM  Lumbar spine xray - No fx Cervical spine xray - no FX Prelimnary reading by Timothy Saxon Ece [place],FNP    Assessment & Plan:  Neck pain - Plan: DG Cervical Spine Complete, DG Lumbar Spine 2-3 Views  Leg weakness, bilateral - Plan: DG Cervical Spine Complete, DG Lumbar Spine 2-3 Views  Timothy Penner FNP

## 2014-06-14 ENCOUNTER — Encounter: Payer: Self-pay | Admitting: Nurse Practitioner

## 2014-06-14 ENCOUNTER — Ambulatory Visit (INDEPENDENT_AMBULATORY_CARE_PROVIDER_SITE_OTHER): Payer: Medicare Other | Admitting: Nurse Practitioner

## 2014-06-14 VITALS — BP 130/84 | HR 77 | Temp 97.2°F | Ht 68.0 in | Wt 196.0 lb

## 2014-06-14 DIAGNOSIS — L723 Sebaceous cyst: Secondary | ICD-10-CM

## 2014-06-14 DIAGNOSIS — M2550 Pain in unspecified joint: Secondary | ICD-10-CM

## 2014-06-14 DIAGNOSIS — L089 Local infection of the skin and subcutaneous tissue, unspecified: Secondary | ICD-10-CM

## 2014-06-14 MED ORDER — SULFAMETHOXAZOLE-TMP DS 800-160 MG PO TABS
1.0000 | ORAL_TABLET | Freq: Two times a day (BID) | ORAL | Status: DC
Start: 2014-06-14 — End: 2014-06-22

## 2014-06-14 NOTE — Patient Instructions (Signed)

## 2014-06-14 NOTE — Progress Notes (Signed)
   Subjective:    Patient ID: Timothy Duran, male    DOB: 1946-03-24, 68 y.o.   MRN: [id number]  HPI Patient is having multiply joint pain in bilateral knees, ankles, elbows for several years that is getting worse.  No swelling or redness.  Only takes Tylenol for pain relief.  Has a large red nodule on post upper thoracic region x 1 week that has been draining.   Review of Systems  Constitutional: Negative.   Respiratory: Negative.   Cardiovascular: Negative.   Musculoskeletal: Positive for arthralgias.  Skin:       Large erthymatous area on upper thoracic with drainage   Psychiatric/Behavioral: Negative.        Objective:   Physical Exam  Constitutional: He is oriented to person, place, and time. He appears well-developed and well-nourished.  Cardiovascular: Normal rate, regular rhythm and normal heart sounds.   Pulmonary/Chest: Effort normal and breath sounds normal.  Abdominal: Soft. Bowel sounds are normal.  Musculoskeletal: Normal range of motion. He exhibits no edema and no tenderness.  Neurological: He is alert and oriented to person, place, and time.  Skin: Skin is warm and dry.  4cm x 4 cm erythematous hard nodule with no drainage present  Psychiatric: He has a normal mood and affect. His behavior is normal. Judgment and thought content normal.   BP 130/84  Pulse 77  Temp(Src) 97.2 F (36.2 C) (Oral)  Ht 5\' 8"  (1.727 m)  Wt 196 lb (88.905 kg)  BMI 29.81 kg/m2  Procedure:  lidocaine 1% with epi 2cc local  Cleaned with betadine  #11 blade- copious amounts of yellow excudate as well as cheesy looking material exsanguinated  Packing inserted  tegarderm dressing applied     Assessment & Plan:  1. Joint pain Motrin OTC until labs are back - Arthritis Panel - Lyme Ab/Western Blot Reflex - Rocky mtn spotted fvr abs pnl(IgG+IgM)  2. Infected sebaceous cyst Keep clean and dry Remove 1/4 inch of packing daily Culture pending RTO Monday for recheck -  sulfamethoxazole-trimethoprim (BACTRIM DS) 800-160 MG per tablet; Take 1 tablet by mouth 2 (two) times daily.  Dispense: 20 tablet; Refill: 0  Mary-Margaret Hassell Done, FNP

## 2014-06-15 LAB — ARTHRITIS PANEL
BASOS: 1 %
Basophils Absolute: 0.1 10*3/uL (ref 0.0–0.2)
Eos: 4 %
Eosinophils Absolute: 0.4 10*3/uL (ref 0.0–0.4)
HEMATOCRIT: 45.5 % (ref 37.5–51.0)
HEMOGLOBIN: 15.4 g/dL (ref 12.6–17.7)
Immature Grans (Abs): 0 10*3/uL (ref 0.0–0.1)
Immature Granulocytes: 0 %
LYMPHS: 26 %
Lymphocytes Absolute: 2.5 10*3/uL (ref 0.7–3.1)
MCH: 31.1 pg (ref 26.6–33.0)
MCHC: 33.8 g/dL (ref 31.5–35.7)
MCV: 92 fL (ref 79–97)
MONOS ABS: 0.8 10*3/uL (ref 0.1–0.9)
Monocytes: 8 %
Neutrophils Absolute: 5.8 10*3/uL (ref 1.4–7.0)
Neutrophils Relative %: 61 %
Platelets: 278 10*3/uL (ref 150–379)
RBC: 4.95 x10E6/uL (ref 4.14–5.80)
RDW: 15 % (ref 12.3–15.4)
Rhuematoid fact SerPl-aCnc: 9.8 IU/mL (ref 0.0–13.9)
SED RATE: 10 mm/h (ref 0–30)
Uric Acid: 7.7 mg/dL (ref 3.7–8.6)
WBC: 9.7 10*3/uL (ref 3.4–10.8)

## 2014-06-18 ENCOUNTER — Encounter: Payer: Self-pay | Admitting: Nurse Practitioner

## 2014-06-18 ENCOUNTER — Ambulatory Visit (INDEPENDENT_AMBULATORY_CARE_PROVIDER_SITE_OTHER): Payer: Medicare Other | Admitting: Nurse Practitioner

## 2014-06-18 VITALS — BP 107/61 | HR 96 | Temp 97.2°F | Ht 68.0 in | Wt 197.0 lb

## 2014-06-18 DIAGNOSIS — L089 Local infection of the skin and subcutaneous tissue, unspecified: Secondary | ICD-10-CM

## 2014-06-18 DIAGNOSIS — L723 Sebaceous cyst: Secondary | ICD-10-CM

## 2014-06-18 LAB — ROCKY MTN SPOTTED FVR ABS PNL(IGG+IGM)
RMSF IGM: 0.61 {index} (ref 0.00–0.89)
RMSF IgG: POSITIVE — AB

## 2014-06-18 LAB — ANAEROBIC AND AEROBIC CULTURE

## 2014-06-18 LAB — LYME AB/WESTERN BLOT REFLEX
LYME DISEASE AB, QUANT, IGM: <0.8 index (ref 0.00–0.79)
Lyme IgG/IgM Ab: <0.91 {ISR} (ref 0.00–0.90)

## 2014-06-18 LAB — RMSF, IGG, IFA: RMSF, IGG, IFA: 1:64 {titer} — ABNORMAL HIGH

## 2014-06-18 NOTE — Progress Notes (Signed)
   Subjective:    Patient ID: Timothy Duran, male    DOB: 1946/09/18, 68 y.o.   MRN: [id number]  HPI Patient had a large sebaceous cyst removed last week- Here today for packing change- Wife has been removing a quarter inch of backing daily. Still drainig some.    Review of Systems  All other systems reviewed and are negative.      Objective:   Physical Exam  Constitutional: He is oriented to person, place, and time. He appears well-developed and well-nourished.  Cardiovascular: Normal rate, regular rhythm and normal heart sounds.   Pulmonary/Chest: Effort normal and breath sounds normal.  Neurological: He is alert and oriented to person, place, and time.  Skin: Skin is warm.  Packing removed and changed- still some drainage- yellowish  Psychiatric: He has a normal mood and affect. His behavior is normal. Judgment and thought content normal.    BP 107/61  Pulse 96  Temp(Src) 97.2 F (36.2 C) (Oral)  Ht 5\' 8"  (1.727 m)  Wt 197 lb (89.359 kg)  BMI 29.96 kg/m2       Assessment & Plan:   1. Infected sebaceous cyst    Remove 1/4 inch of packing daily Finish antibiotic Recheck on Friday  Mary-Margaret Hassell Done, FNP

## 2014-06-19 ENCOUNTER — Other Ambulatory Visit: Payer: Self-pay | Admitting: Nurse Practitioner

## 2014-06-19 ENCOUNTER — Telehealth: Payer: Self-pay | Admitting: Nurse Practitioner

## 2014-06-19 MED ORDER — DOXYCYCLINE HYCLATE 100 MG PO TABS: 100.0000 mg | ORAL_TABLET | Freq: Two times a day (BID) | ORAL | Status: DC

## 2014-06-19 NOTE — Telephone Encounter (Signed)
Message copied by Cline Crock on Tue Jun 19, 2014 11:12 AM ------      Message from: Chevis Pretty      Created: Tue Jun 19, 2014  9:59 AM       Culture grew staph- wsa given bactrim- good coverage      (+) RMSF- needs doxycycline- rx sent to pharmacy ------

## 2014-06-20 NOTE — Telephone Encounter (Signed)
Patient aware.

## 2014-06-22 ENCOUNTER — Encounter: Payer: Self-pay | Admitting: Nurse Practitioner

## 2014-06-22 ENCOUNTER — Ambulatory Visit (INDEPENDENT_AMBULATORY_CARE_PROVIDER_SITE_OTHER): Payer: Medicare Other | Admitting: Nurse Practitioner

## 2014-06-22 VITALS — BP 118/72 | HR 102 | Temp 97.1°F | Ht 68.0 in | Wt 197.0 lb

## 2014-06-22 DIAGNOSIS — G459 Transient cerebral ischemic attack, unspecified: Secondary | ICD-10-CM

## 2014-06-22 DIAGNOSIS — Z6829 Body mass index (BMI) 29.0-29.9, adult: Secondary | ICD-10-CM

## 2014-06-22 DIAGNOSIS — Z713 Dietary counseling and surveillance: Secondary | ICD-10-CM

## 2014-06-22 DIAGNOSIS — I10 Essential (primary) hypertension: Secondary | ICD-10-CM

## 2014-06-22 MED ORDER — CLOPIDOGREL BISULFATE 75 MG PO TABS: 75.0000 mg | ORAL_TABLET | Freq: Once | ORAL | Status: DC

## 2014-06-22 NOTE — Patient Instructions (Signed)

## 2014-06-22 NOTE — Progress Notes (Signed)
Subjective:    Patient ID: Timothy Duran, male    DOB: 10-26-1945, 68 y.o.   MRN: [id number]  Patient here today for follow up of chronic medical problems. He is also here for recheck of sebaceous cyst that was drained about 12 days ago. Is currently packed with slight drainage.   Hypertension This is a chronic problem. The current episode started more than 1 year ago. The problem is unchanged. The problem is controlled. Pertinent negatives include no blurred vision, chest pain, headaches, orthopnea, palpitations, peripheral edema or shortness of breath. There are no associated agents to hypertension. Risk factors for coronary artery disease include family history, male gender and obesity. Past treatments include angiotensin blockers and diuretics. The current treatment provides significant improvement. There are no compliance problems.  Hypertensive end-organ damage includes CVA (no residual affects).  Hx TIA Greater then 5 years ago- currently on plavix without any bleeding.   Review of Systems  Eyes: Negative for blurred vision.  Respiratory: Negative for shortness of breath.   Cardiovascular: Negative for chest pain, palpitations and orthopnea.  Neurological: Negative for headaches.       Objective:   Physical Exam  Constitutional: He is oriented to person, place, and time. He appears well-developed and well-nourished.  HENT:  Head: Normocephalic.  Right Ear: External ear normal.  Left Ear: External ear normal.  Nose: Nose normal.  Mouth/Throat: Oropharynx is clear and moist.  Eyes: EOM are normal. Pupils are equal, round, and reactive to light.  Neck: Normal range of motion. Neck supple. No JVD present. No thyromegaly present.  Cardiovascular: Normal rate, regular rhythm, normal heart sounds and intact distal pulses.  Exam reveals no gallop and no friction rub.   No murmur heard. Pulmonary/Chest: Effort normal and breath sounds normal. No respiratory distress. He has no  wheezes. He has no rales. He exhibits no tenderness.  Abdominal: Soft. Bowel sounds are normal. He exhibits no mass. There is no tenderness.  Genitourinary:  Refused prostate rectal exam  Musculoskeletal: Normal range of motion. He exhibits no edema.  Lymphadenopathy:    He has no cervical adenopathy.  Neurological: He is alert and oriented to person, place, and time. No cranial nerve deficit.  Skin: Skin is warm and dry.  Packing change to sebaceous cyst on upper mid back- no sign of infection  Psychiatric: He has a normal mood and affect. His behavior is normal. Judgment and thought content normal.   BP 118/72  Pulse 102  Temp(Src) 97.1 F (36.2 C) (Oral)  Ht _0  (1.727 m)  Wt 197 lb (89.359 kg)  BMI 29.96 kg/m2        Assessment & Plan:   1. Essential hypertension   2. Transient cerebral ischemia, unspecified transient cerebral ischemia type    Orders Placed This Encounter  Procedures  . CMP14+EGFR  . NMR, lipoprofile  . PSA, total and free   Meds ordered this encounter  Medications  . clopidogrel (PLAVIX) 75 MG tablet    Sig: Take 1 tablet (75 mg total) by mouth once.    Dispense:  90 tablet    Refill:  1    Order Specific Question:  Supervising Provider    Answer:  Chipper Herb [[id number]]  hemoccult cards given to patient with directions Remove packing 1/4 inch daily as previously discussed- rto prn Discussed weight management for patient with BMI> 25- will recheck in 3 months Labs pending Health maintenance reviewed Diet and exercise encouraged Continue all meds Follow  up  In 3 months   [place], FNP

## 2014-06-23 LAB — CMP14+EGFR
A/G RATIO: 1.8 (ref 1.1–2.5)
ALT: 16 IU/L (ref 0–44)
AST: 13 IU/L (ref 0–40)
Albumin: 4.2 g/dL (ref 3.5–4.8)
Alkaline Phosphatase: 82 IU/L (ref 39–117)
BILIRUBIN TOTAL: 0.2 mg/dL (ref 0.0–1.2)
BUN / CREAT RATIO: 16 (ref 10–22)
BUN: 18 mg/dL (ref 8–27)
CALCIUM: 10.3 mg/dL — AB (ref 8.6–10.2)
CO2: 23 mmol/L (ref 18–29)
CREATININE: 1.11 mg/dL (ref 0.76–1.27)
Chloride: 100 mmol/L (ref 97–108)
GFR, EST AFRICAN AMERICAN: 77 mL/min/{1.73_m2} (ref >59)
GFR, EST NON AFRICAN AMERICAN: 67 mL/min/{1.73_m2} (ref >59)
GLOBULIN, TOTAL: 2.4 g/dL (ref 1.5–4.5)
Glucose: 95 mg/dL (ref 65–99)
POTASSIUM: 4.4 mmol/L (ref 3.5–5.2)
Sodium: 138 mmol/L (ref 134–144)
Total Protein: 6.6 g/dL (ref 6.0–8.5)

## 2014-06-23 LAB — NMR, LIPOPROFILE
Cholesterol: 219 mg/dL — ABNORMAL HIGH (ref 100–199)
HDL CHOLESTEROL BY NMR: 32 mg/dL — AB (ref >39)
HDL PARTICLE NUMBER: 26.5 umol/L — AB (ref >=30.5)
LDL Particle Number: 1850 nmol/L — ABNORMAL HIGH (ref <1000)
LDL SIZE: 20.2 nm (ref >20.5)
LDLC SERPL CALC-MCNC: 121 mg/dL — ABNORMAL HIGH (ref 0–99)
LP-IR Score: 92 — ABNORMAL HIGH (ref <=45)
Small LDL Particle Number: 1077 nmol/L — ABNORMAL HIGH (ref <=527)
Triglycerides by NMR: 332 mg/dL — ABNORMAL HIGH (ref 0–149)

## 2014-06-23 LAB — PSA, TOTAL AND FREE
PSA, Free Pct: >10 %
PSA, Free: 0.01 ng/mL
PSA: <0.1 ng/mL (ref 0.0–4.0)

## 2014-07-02 ENCOUNTER — Other Ambulatory Visit: Payer: Medicare Other

## 2014-07-02 DIAGNOSIS — Z1212 Encounter for screening for malignant neoplasm of rectum: Secondary | ICD-10-CM

## 2014-07-02 NOTE — Progress Notes (Signed)
Lab only 

## 2014-07-03 LAB — FECAL OCCULT BLOOD, IMMUNOCHEMICAL: Fecal Occult Bld: NEGATIVE

## 2014-07-16 ENCOUNTER — Telehealth: Payer: Self-pay | Admitting: Family Medicine

## 2014-07-16 NOTE — Telephone Encounter (Signed)
Appointment made

## 2014-07-16 NOTE — Telephone Encounter (Signed)
All the packing feel out of his cyst does he need to come back in he said you told him to call?

## 2014-07-16 NOTE — Telephone Encounter (Signed)
May want so=meone to just take a look at it.

## 2014-07-18 ENCOUNTER — Ambulatory Visit (INDEPENDENT_AMBULATORY_CARE_PROVIDER_SITE_OTHER): Payer: Medicare Other | Admitting: Family Medicine

## 2014-07-18 ENCOUNTER — Encounter: Payer: Self-pay | Admitting: Family Medicine

## 2014-07-18 VITALS — BP 121/67 | HR 90 | Temp 97.2°F | Ht 68.0 in | Wt 196.0 lb

## 2014-07-18 DIAGNOSIS — L989 Disorder of the skin and subcutaneous tissue, unspecified: Secondary | ICD-10-CM

## 2014-07-19 NOTE — Progress Notes (Signed)
   Subjective:    Patient ID: Timothy Duran, male    DOB: Jun 16, 1946, 68 y.o.   MRN: [id number]  HPI This 68 y.o. male presents for evaluation of follow up on sebaceous cyst on back that was I&D andhe is here for follow up.  He has a skin lesion on his right forearm.   Review of Systems C/o skin lesion   No chest pain, SOB, HA, dizziness, vision change, N/V, diarrhea, constipation, dysuria, urinary urgency or frequency, myalgias, arthralgias or rash.  Objective:   Physical Exam  Vital signs noted  Well developed well nourished male.  HEENT - Head atraumatic Normocephalic Respiratory - Lungs CTA bilateral Cardiac - RRR S1 and S2 without murmur Skin - cyst on left back is healed and right forearm with dry scaly elevated anular skin lesion 1/2 cm in diameter.      Assessment & Plan:  Skin lesion - Plan: Ambulatory referral to Dermatology Discussed with patient that this looks like squamous cell carcinoma so will refer to Dermatology  Sebaceous cyst - resolved.  Lysbeth Penner FNP

## 2014-07-23 ENCOUNTER — Encounter: Payer: Self-pay | Admitting: Gastroenterology

## 2014-08-09 ENCOUNTER — Other Ambulatory Visit: Payer: Self-pay | Admitting: Dermatology

## 2014-08-22 ENCOUNTER — Other Ambulatory Visit: Payer: Self-pay | Admitting: Dermatology

## 2014-11-12 ENCOUNTER — Other Ambulatory Visit: Payer: Self-pay | Admitting: Nurse Practitioner

## 2014-12-31 ENCOUNTER — Other Ambulatory Visit: Payer: Self-pay | Admitting: Nurse Practitioner

## 2015-01-01 NOTE — Telephone Encounter (Signed)
Last seen 07/18/14 B [place]  No upcoming appt.

## 2015-01-01 NOTE — Telephone Encounter (Signed)
Patient NTBS for follow up and lab work  

## 2015-01-02 NOTE — Telephone Encounter (Signed)
Left message for pt to call and schedule appt for med refill

## 2015-02-18 ENCOUNTER — Other Ambulatory Visit: Payer: Self-pay | Admitting: Nurse Practitioner

## 2015-04-07 ENCOUNTER — Other Ambulatory Visit: Payer: Self-pay | Admitting: Nurse Practitioner

## 2015-04-08 NOTE — Telephone Encounter (Signed)
Patient NTBS for follow up and lab work  

## 2015-04-08 NOTE — Telephone Encounter (Signed)
Last seen 07/18/14 B [place]

## 2015-04-15 ENCOUNTER — Other Ambulatory Visit: Payer: Self-pay

## 2015-04-24 ENCOUNTER — Telehealth: Payer: Self-pay | Admitting: Pharmacist

## 2015-04-24 NOTE — Telephone Encounter (Signed)
Patient is in need of appointment for labs.  Last visit was 06/2014.  Tried to call at home and cell number - LM at both numbers.

## 2015-05-08 ENCOUNTER — Ambulatory Visit (INDEPENDENT_AMBULATORY_CARE_PROVIDER_SITE_OTHER): Payer: Medicare Other | Admitting: Nurse Practitioner

## 2015-05-08 ENCOUNTER — Encounter: Payer: Self-pay | Admitting: Nurse Practitioner

## 2015-05-08 ENCOUNTER — Other Ambulatory Visit: Payer: Self-pay | Admitting: Nurse Practitioner

## 2015-05-08 ENCOUNTER — Ambulatory Visit (HOSPITAL_COMMUNITY)
Admission: RE | Admit: 2015-05-08 | Discharge: 2015-05-08 | Disposition: A | Discharge status: Home / Self Care | Payer: Medicare Other | Source: Ambulatory Visit | Attending: Nurse Practitioner | Admitting: Nurse Practitioner

## 2015-05-08 ENCOUNTER — Ambulatory Visit (INDEPENDENT_AMBULATORY_CARE_PROVIDER_SITE_OTHER): Payer: Medicare Other

## 2015-05-08 VITALS — BP 117/86 | HR 92 | Temp 97.2°F | Ht 68.0 in | Wt 198.0 lb

## 2015-05-08 DIAGNOSIS — R197 Diarrhea, unspecified: Secondary | ICD-10-CM

## 2015-05-08 DIAGNOSIS — R0602 Shortness of breath: Secondary | ICD-10-CM

## 2015-05-08 DIAGNOSIS — E785 Hyperlipidemia, unspecified: Secondary | ICD-10-CM

## 2015-05-08 DIAGNOSIS — N200 Calculus of kidney: Secondary | ICD-10-CM | POA: Insufficient documentation

## 2015-05-08 DIAGNOSIS — R1013 Epigastric pain: Secondary | ICD-10-CM | POA: Diagnosis not present

## 2015-05-08 DIAGNOSIS — I10 Essential (primary) hypertension: Secondary | ICD-10-CM

## 2015-05-08 DIAGNOSIS — R112 Nausea with vomiting, unspecified: Secondary | ICD-10-CM | POA: Diagnosis not present

## 2015-05-08 LAB — POCT CBC
Granulocyte percent: 80 %G (ref 37–80)
HEMATOCRIT: 46.7 % (ref 43.5–53.7)
Hemoglobin: 16.2 g/dL (ref 14.1–18.1)
LYMPH, POC: 2 (ref 0.6–3.4)
MCH: 32 pg — AB (ref 27–31.2)
MCHC: 34.6 g/dL (ref 31.8–35.4)
MCV: 92.3 fL (ref 80–97)
MPV: 8.1 fL (ref 0–99.8)
PLATELET COUNT, POC: 243 10*3/uL (ref 142–424)
POC Granulocyte: 9.3 — AB (ref 2–6.9)
POC LYMPH %: 16.9 % (ref 10–50)
RBC: 5.06 M/uL (ref 4.69–6.13)
RDW, POC: 13.7 %
WBC: 11.6 10*3/uL — AB (ref 4.6–10.2)

## 2015-05-08 LAB — POCT I-STAT CREATININE: Creatinine, Ser: 0.9 mg/dL (ref 0.61–1.24)

## 2015-05-08 MED ORDER — LANSOPRAZOLE 30 MG PO CPDR: 30.0000 mg | DELAYED_RELEASE_CAPSULE | Freq: Every day | ORAL | Status: DC

## 2015-05-08 MED ORDER — IOHEXOL 300 MG/ML  SOLN
100.0000 mL | Freq: Once | INTRAMUSCULAR | Status: AC | PRN
  Administered 2015-05-08: 100 mL via INTRAVENOUS

## 2015-05-08 NOTE — Addendum Note (Signed)
Addended by: Rolena Infante on: 05/08/2015 04:58 PM   Modules accepted: Orders

## 2015-05-08 NOTE — Progress Notes (Signed)
Subjective:    Patient ID: Timothy Duran, male    DOB: 14-Apr-1946, 69 y.o.   MRN: [id number]  HPI Pateint is brought in by daughter and wife- he has been c/o abdominal pain with vomiting and diarrhea- with burning sensation in stomach- eating ice cream makes pain better- His daughter says that he has had SOB and feels hot all the time but no fever. His daughter says that he has been "zoning out" for a few seconds. Acts like he just has no energy to do anything. He has also been c/o left groin pain for about 2 weeks- walking increases pain.    Review of Systems  Constitutional: Positive for activity change and fatigue. Negative for appetite change.  HENT: Negative.   Respiratory: Positive for shortness of breath.   Cardiovascular: Positive for leg swelling. Negative for chest pain and palpitations.  Gastrointestinal: Positive for nausea, vomiting, diarrhea and abdominal distention.  Genitourinary: Negative.   Musculoskeletal: Negative.   Neurological: Negative.   Psychiatric/Behavioral: Negative.   All other systems reviewed and are negative.      Objective:   Physical Exam  Constitutional: He is oriented to person, place, and time. He appears well-developed and well-nourished. No distress.  Cardiovascular: Normal rate, regular rhythm and normal heart sounds.   Pulmonary/Chest: Effort normal and breath sounds normal. No respiratory distress.  Abdominal: He exhibits distension and mass (large abdominal wall hernia). There is tenderness. There is guarding. There is no rebound.  Neurological: He is alert and oriented to person, place, and time.  Skin: Skin is warm.  Psychiatric: He has a normal mood and affect. His behavior is normal. Judgment and thought content normal.  BP 117/86 mmHg  Pulse 92  Temp(Src) 97.2 F (36.2 C) (Oral)  Ht '5\' 8"'  (1.727 m)  Wt 198 lb (89.812 kg)  BMI 30.11 kg/m2  SpO2 97%   Results for orders placed or performed in visit on 05/08/15  POCT CBC  Result  Value Ref Range   WBC 11.6 (A) 4.6 - 10.2 K/uL   Lymph, poc 2.0 0.6 - 3.4   POC LYMPH PERCENT 16.9 10 - 50 %L   MID (cbc)  0 - 0.9   POC MID %  0 - 12 %M   POC Granulocyte 9.3 (A) 2 - 6.9   Granulocyte percent 80.0 37 - 80 %G   RBC 5.06 4.69 - 6.13 M/uL   Hemoglobin 16.2 14.1 - 18.1 g/dL   HCT, POC 46.7 43.5 - 53.7 %   MCV 92.3 80 - 97 fL   MCH, POC 32.0 (A) 27 - 31.2 pg   MCHC 34.6 31.8 - 35.4 g/dL   RDW, POC 13.7 %   Platelet Count, POC 243 142 - 424 K/uL   MPV 8.1 0 - 99.8 fL     chest x [place]-Scarring of right mddle lobe with dispalced esophagus to the right-Preliminary reading by Ronnald Collum, FNP  [facility] KUB- abdominal distention with maoderate amount of stool burden on right.  EKG- old infarct- unchanged from previous    Assessment & Plan:   1. Hyperlipidemia with target LDL less than 100   2. Essential hypertension   3. Non-intractable vomiting with nausea, vomiting of unspecified type   4. Diarrhea   5. Epigastric pain   6. SOB (shortness of breath)    Orders Placed This Encounter  Procedures  . DG Abd 1 View    Standing Status: Future     Number of Occurrences: 1  Standing Expiration Date: 07/07/2016    Order Specific Question:  Reason for Exam (SYMPTOM  OR DIAGNOSIS REQUIRED)    Answer:  abd pain    Order Specific Question:  Preferred imaging location?    Answer:  Internal  . DG Chest 2 View    Standing Status: Future     Number of Occurrences: 1     Standing Expiration Date: 07/07/2016    Order Specific Question:  Reason for Exam (SYMPTOM  OR DIAGNOSIS REQUIRED)    Answer:  SOB    Order Specific Question:  Preferred imaging location?    Answer:  Internal  . CT Abdomen Pelvis W Contrast    Standing Status: Future     Number of Occurrences:      Standing Expiration Date: 08/07/2016    Order Specific Question:  Reason for Exam (SYMPTOM  OR DIAGNOSIS REQUIRED)    Answer:  abdominal pain    Order Specific Question:  Preferred imaging location?    Answer:   [facility]  . Amylase  . Lipase  . Lipid panel  . POCT CBC  . EKG 12-Lead   NPO until CT scan completed Call report  Mary-Margaret Hassell Done, FNP

## 2015-05-09 LAB — CMP14+EGFR
A/G RATIO: 1.7 (ref 1.1–2.5)
ALBUMIN: 4.2 g/dL (ref 3.5–4.8)
ALT: 17 IU/L (ref 0–44)
AST: 16 IU/L (ref 0–40)
Alkaline Phosphatase: 89 IU/L (ref 39–117)
BILIRUBIN TOTAL: 0.3 mg/dL (ref 0.0–1.2)
BUN/Creatinine Ratio: 20 (ref 10–22)
BUN: 18 mg/dL (ref 8–27)
CHLORIDE: 100 mmol/L (ref 97–108)
CO2: 19 mmol/L (ref 18–29)
CREATININE: 0.89 mg/dL (ref 0.76–1.27)
Calcium: 10.6 mg/dL — ABNORMAL HIGH (ref 8.6–10.2)
GFR calc Af Amer: 99 mL/min/{1.73_m2} (ref >59)
GFR calc non Af Amer: 86 mL/min/{1.73_m2} (ref >59)
Globulin, Total: 2.5 g/dL (ref 1.5–4.5)
Glucose: 102 mg/dL — ABNORMAL HIGH (ref 65–99)
Potassium: 4.6 mmol/L (ref 3.5–5.2)
SODIUM: 139 mmol/L (ref 134–144)
Total Protein: 6.7 g/dL (ref 6.0–8.5)

## 2015-05-09 LAB — LIPID PANEL
Chol/HDL Ratio: 4.7 ratio units (ref 0.0–5.0)
Cholesterol, Total: 206 mg/dL — ABNORMAL HIGH (ref 100–199)
HDL: 44 mg/dL (ref >39)
LDL Calculated: 135 mg/dL — ABNORMAL HIGH (ref 0–99)
TRIGLYCERIDES: 135 mg/dL (ref 0–149)
VLDL Cholesterol Cal: 27 mg/dL (ref 5–40)

## 2015-05-09 LAB — LIPASE: Lipase: 27 U/L (ref 0–59)

## 2015-05-09 LAB — AMYLASE: Amylase: 61 U/L (ref 31–124)

## 2015-05-13 ENCOUNTER — Encounter: Payer: Self-pay | Admitting: Nurse Practitioner

## 2015-05-13 ENCOUNTER — Ambulatory Visit (INDEPENDENT_AMBULATORY_CARE_PROVIDER_SITE_OTHER): Payer: Medicare Other | Admitting: Nurse Practitioner

## 2015-05-13 VITALS — BP 148/96 | HR 96 | Temp 98.6°F | Ht 68.0 in | Wt 198.0 lb

## 2015-05-13 DIAGNOSIS — K219 Gastro-esophageal reflux disease without esophagitis: Secondary | ICD-10-CM | POA: Insufficient documentation

## 2015-05-13 DIAGNOSIS — G459 Transient cerebral ischemic attack, unspecified: Secondary | ICD-10-CM | POA: Diagnosis not present

## 2015-05-13 DIAGNOSIS — I10 Essential (primary) hypertension: Secondary | ICD-10-CM | POA: Diagnosis not present

## 2015-05-13 DIAGNOSIS — R040 Epistaxis: Secondary | ICD-10-CM

## 2015-05-13 MED ORDER — OLMESARTAN MEDOXOMIL-HCTZ 20-12.5 MG PO TABS: 1.0000 | ORAL_TABLET | Freq: Every day | ORAL | Status: DC

## 2015-05-13 MED ORDER — LANSOPRAZOLE 30 MG PO CPDR: 30.0000 mg | DELAYED_RELEASE_CAPSULE | Freq: Every day | ORAL | Status: DC

## 2015-05-13 NOTE — Patient Instructions (Signed)

## 2015-05-13 NOTE — Progress Notes (Signed)
Subjective:    Patient ID: Timothy Duran, male    DOB: 18-Nov-1945, 69 y.o.   MRN: [id number]  Patient here today for follow up of chronic medical problems.   * woke up this morning with nose bleed- has been bleeding for several hours. * was seen last week with abd pain- ct of abdomen negative- was dx with gastric ulcer an dwas out on prevacid- says he is feeling better.  Hypertension This is a chronic problem. The current episode started more than 1 year ago. The problem is unchanged. The problem is controlled. Pertinent negatives include no chest pain, headaches, palpitations or shortness of breath. Risk factors for coronary artery disease include dyslipidemia, obesity, family history and male gender. Past treatments include ACE inhibitors and diuretics. The current treatment provides moderate improvement. Compliance problems include diet and exercise.  Hypertensive end-organ damage includes CAD/MI and CVA. There is no history of retinopathy.  Hx TIA Greater then 5 years ago- currently on plavix without any bleeding. GERD Prevacid daily with good control      Review of Systems  Constitutional: Negative.   HENT: Negative.   Respiratory: Negative.  Negative for shortness of breath.   Cardiovascular: Negative.  Negative for chest pain and palpitations.  Gastrointestinal: Negative.   Genitourinary: Negative.   Musculoskeletal: Negative.   Neurological: Negative.  Negative for headaches.  Psychiatric/Behavioral: Negative.   All other systems reviewed and are negative.      Objective:   Physical Exam  Constitutional: He is oriented to person, place, and time. He appears well-developed and well-nourished.  HENT:  Head: Normocephalic.  Right Ear: External ear normal.  Left Ear: External ear normal.  Nose: Nose normal.  Mouth/Throat: Oropharynx is clear and moist.  Nose bleed no visible bleeding site  Eyes: EOM are normal. Pupils are equal, round, and reactive to light.  Neck:  Normal range of motion. Neck supple. No JVD present. No thyromegaly present.  Cardiovascular: Normal rate, regular rhythm, normal heart sounds and intact distal pulses.  Exam reveals no gallop and no friction rub.   No murmur heard. Pulmonary/Chest: Effort normal and breath sounds normal. No respiratory distress. He has no wheezes. He has no rales. He exhibits no tenderness.  Abdominal: Soft. Bowel sounds are normal. He exhibits no mass. There is no tenderness.  Genitourinary:  Refused prostate rectal exam  Musculoskeletal: Normal range of motion. He exhibits no edema.  Lymphadenopathy:    He has no cervical adenopathy.  Neurological: He is alert and oriented to person, place, and time. No cranial nerve deficit.  Skin: Skin is warm and dry.  Psychiatric: He has a normal mood and affect. His behavior is normal. Judgment and thought content normal.    BP 148/96 mmHg  Pulse 96  Temp(Src) 98.6 F (37 C) (Oral)  Ht 5\' 8"  (1.727 m)  Wt 198 lb (89.812 kg)  BMI 30.11 kg/m2         Assessment & Plan:   1. Transient cerebral ischemia, unspecified transient cerebral ischemia type Continue plavix  2. Essential hypertension Do not add salt to diet - olmesartan-hydrochlorothiazide (BENICAR HCT) 20-12.5 MG per tablet; Take 1 tablet by mouth daily.  Dispense: 30 tablet; Refill: 5 - Lipid panel  3. Gastroesophageal reflux disease without esophagitis Avoid spicy foods Do not eat 2 hours prior to bedtime  - lansoprazole (PREVACID) 30 MG capsule; Take 1 capsule (30 mg total) by mouth daily at 12 noon.  Dispense: 30 capsule; Refill: 2  4.  Epistaxis avoid heat rest - Ambulatory referral to ENT    Labs pending Health maintenance reviewed Diet and exercise encouraged Continue all meds Follow up  In 3 months   [place], FNP

## 2015-05-14 ENCOUNTER — Telehealth: Payer: Self-pay | Admitting: *Deleted

## 2015-05-14 NOTE — Telephone Encounter (Signed)
Patient states that he saw ENT today and they said he should stay off of Plavix for a few days. Wanted to check with [place] first before he did. Advised to stop Plavix today and restart on Friday. Also continue to take a baby asa qd while off plavix. Patient verbalized understanding

## 2015-05-20 ENCOUNTER — Observation Stay (HOSPITAL_COMMUNITY)
Admission: AD | Admit: 2015-05-20 | Discharge: 2015-05-22 | Disposition: A | Discharge status: Home / Self Care | Payer: Medicare Other | Source: Ambulatory Visit | Attending: Otolaryngology | Admitting: Otolaryngology

## 2015-05-20 ENCOUNTER — Observation Stay (HOSPITAL_COMMUNITY): Payer: Medicare Other

## 2015-05-20 ENCOUNTER — Encounter (HOSPITAL_COMMUNITY): Payer: Self-pay | Admitting: General Practice

## 2015-05-20 DIAGNOSIS — Z8546 Personal history of malignant neoplasm of prostate: Secondary | ICD-10-CM | POA: Diagnosis not present

## 2015-05-20 DIAGNOSIS — Z87891 Personal history of nicotine dependence: Secondary | ICD-10-CM | POA: Diagnosis not present

## 2015-05-20 DIAGNOSIS — Z8673 Personal history of transient ischemic attack (TIA), and cerebral infarction without residual deficits: Secondary | ICD-10-CM | POA: Insufficient documentation

## 2015-05-20 DIAGNOSIS — R040 Epistaxis: Secondary | ICD-10-CM | POA: Diagnosis present

## 2015-05-20 DIAGNOSIS — Z880 Allergy status to penicillin: Secondary | ICD-10-CM | POA: Diagnosis not present

## 2015-05-20 DIAGNOSIS — I10 Essential (primary) hypertension: Secondary | ICD-10-CM | POA: Diagnosis not present

## 2015-05-20 DIAGNOSIS — R51 Headache: Secondary | ICD-10-CM | POA: Insufficient documentation

## 2015-05-20 DIAGNOSIS — R519 Headache, unspecified: Secondary | ICD-10-CM

## 2015-05-20 HISTORY — DX: Basal cell carcinoma of skin, unspecified: C44.91

## 2015-05-20 HISTORY — DX: Unspecified abdominal hernia without obstruction or gangrene: K46.9

## 2015-05-20 HISTORY — DX: Unspecified osteoarthritis, unspecified site: M19.90

## 2015-05-20 HISTORY — DX: Epistaxis: R04.0

## 2015-05-20 HISTORY — DX: Transient cerebral ischemic attack, unspecified: G45.9

## 2015-05-20 LAB — CREATININE, SERUM
CREATININE: 0.89 mg/dL (ref 0.61–1.24)
GFR calc Af Amer: >60 mL/min (ref >60)

## 2015-05-20 LAB — CK: CK TOTAL: 30 U/L — AB (ref 49–397)

## 2015-05-20 LAB — BUN: BUN: 24 mg/dL — AB (ref 6–20)

## 2015-05-20 MED ORDER — IOHEXOL 350 MG/ML SOLN
50.0000 mL | Freq: Once | INTRAVENOUS | Status: AC | PRN
  Administered 2015-05-20: 50 mL via INTRAVENOUS

## 2015-05-20 MED ORDER — CLINDAMYCIN PHOSPHATE 600 MG/50ML IV SOLN
600.0000 mg | Freq: Three times a day (TID) | INTRAVENOUS | Status: DC
  Administered 2015-05-20 – 2015-05-22 (×6): 600 mg via INTRAVENOUS
  Filled 2015-05-20 (×9): qty 50

## 2015-05-20 MED ORDER — MORPHINE SULFATE 2 MG/ML IJ SOLN
2.0000 mg | INTRAMUSCULAR | Status: DC | PRN
  Administered 2015-05-20 (×2): 2 mg via INTRAVENOUS
  Filled 2015-05-20 (×2): qty 1

## 2015-05-20 MED ORDER — HYDROCODONE-ACETAMINOPHEN 5-325 MG PO TABS
1.0000 | ORAL_TABLET | ORAL | Status: DC | PRN
  Administered 2015-05-20 – 2015-05-21 (×2): 2 via ORAL
  Filled 2015-05-20 (×2): qty 2

## 2015-05-20 MED ORDER — DEXTROSE-NACL 5-0.45 % IV SOLN
INTRAVENOUS | Status: DC
  Administered 2015-05-20 – 2015-05-21 (×2): via INTRAVENOUS

## 2015-05-20 NOTE — H&P (Signed)
Timothy Duran is an 69 y.o. male.   Chief Complaint: epitaxis HPI: hx of left sided nose bleed and cauterized by Simeon Craft on Thursday. He startede bleeding again on Sunday. He had severe left sided headache and facial pain since the procedure. He has stopped his Plavix. He cannot stand the pain any longer and wants the packing Flocel out and pain control.  Past Medical History  Diagnosis Date  . Blood transfusion without reported diagnosis   . Hypertension   . Cancer   . Stroke   . Prostate cancer     Past Surgical History  Procedure Laterality Date  . Lung nodule    . Prostate surgery    . Kidney stone surgery      No family history on file. Social History:  reports that he quit smoking about 6 years ago. He does not have any smokeless tobacco history on file. He reports that he does not drink alcohol or use illicit drugs.  Allergies:  Allergies  Allergen Reactions  . Penicillins     No prescriptions prior to admission    No results found for this or any previous visit (from the past 48 hour(s)). No results found.  Review of Systems  Constitutional: Negative.   HENT: Negative.   Eyes: Negative.   Respiratory: Negative.   Cardiovascular: Negative.   Skin: Negative.     There were no vitals taken for this visit. Physical Exam  Constitutional: He appears well-developed and well-nourished.  HENT:  Nose very irritated left nasal cavity with flocel in place and a lot of cautery burn. Pack placed in to the lefdt side  Eyes: Conjunctivae are normal. Pupils are equal, round, and reactive to light.  Neck: Normal range of motion. Neck supple.  Cardiovascular: Normal rate.   Respiratory: Effort normal.  GI: Soft.  Musculoskeletal: Normal range of motion.     Assessment/Plan Epitaxis- admitted for pain control and epistaxis. He also need CT scan for the left sided severe headache. Will plan on endoscopy tomorrow to clean out floCel and possibly pack or cauterize again.    Melissa Montane 05/20/2015, 1:53 PM

## 2015-05-21 ENCOUNTER — Encounter (HOSPITAL_COMMUNITY)
Admission: AD | Disposition: A | Discharge status: Home / Self Care | Payer: Self-pay | Source: Ambulatory Visit | Attending: Otolaryngology

## 2015-05-21 ENCOUNTER — Observation Stay (HOSPITAL_COMMUNITY): Payer: Medicare Other | Admitting: Certified Registered"

## 2015-05-21 ENCOUNTER — Encounter (HOSPITAL_COMMUNITY): Payer: Self-pay | Admitting: Certified Registered"

## 2015-05-21 DIAGNOSIS — Z8546 Personal history of malignant neoplasm of prostate: Secondary | ICD-10-CM | POA: Diagnosis not present

## 2015-05-21 DIAGNOSIS — I10 Essential (primary) hypertension: Secondary | ICD-10-CM | POA: Diagnosis not present

## 2015-05-21 DIAGNOSIS — R040 Epistaxis: Secondary | ICD-10-CM | POA: Diagnosis not present

## 2015-05-21 DIAGNOSIS — Z8673 Personal history of transient ischemic attack (TIA), and cerebral infarction without residual deficits: Secondary | ICD-10-CM | POA: Diagnosis not present

## 2015-05-21 HISTORY — PX: NASAL ENDOSCOPY WITH EPISTAXIS CONTROL: SHX5664

## 2015-05-21 SURGERY — CONTROL OF EPISTAXIS, ENDOSCOPIC
Anesthesia: General | Laterality: Left

## 2015-05-21 MED ORDER — ACETAMINOPHEN 325 MG PO TABS: 650.0000 mg | ORAL_TABLET | ORAL | Status: DC | PRN

## 2015-05-21 MED ORDER — LACTATED RINGERS IV SOLN: INTRAVENOUS | Status: DC

## 2015-05-21 MED ORDER — HYDROCHLOROTHIAZIDE 12.5 MG PO CAPS
12.5000 mg | ORAL_CAPSULE | Freq: Every day | ORAL | Status: DC
  Filled 2015-05-21: qty 1

## 2015-05-21 MED ORDER — OXYMETAZOLINE HCL 0.05 % NA SOLN
NASAL | Status: AC
  Filled 2015-05-21: qty 15

## 2015-05-21 MED ORDER — PANTOPRAZOLE SODIUM 20 MG PO TBEC
20.0000 mg | DELAYED_RELEASE_TABLET | Freq: Every day | ORAL | Status: DC
  Filled 2015-05-21 (×2): qty 1

## 2015-05-21 MED ORDER — LIDOCAINE HCL (CARDIAC) 20 MG/ML IV SOLN
INTRAVENOUS | Status: AC
  Filled 2015-05-21: qty 5

## 2015-05-21 MED ORDER — SUCCINYLCHOLINE CHLORIDE 20 MG/ML IJ SOLN
INTRAMUSCULAR | Status: DC | PRN
  Administered 2015-05-21: 100 mg via INTRAVENOUS

## 2015-05-21 MED ORDER — LIDOCAINE HCL (CARDIAC) 20 MG/ML IV SOLN
INTRAVENOUS | Status: DC | PRN
  Administered 2015-05-21: 30 mg via INTRAVENOUS

## 2015-05-21 MED ORDER — FENTANYL CITRATE (PF) 250 MCG/5ML IJ SOLN
INTRAMUSCULAR | Status: AC
  Filled 2015-05-21: qty 5

## 2015-05-21 MED ORDER — CLOPIDOGREL BISULFATE 75 MG PO TABS
75.0000 mg | ORAL_TABLET | Freq: Every day | ORAL | Status: DC
  Filled 2015-05-21: qty 1

## 2015-05-21 MED ORDER — PROPOFOL 10 MG/ML IV BOLUS
INTRAVENOUS | Status: DC | PRN
  Administered 2015-05-21: 120 mg via INTRAVENOUS

## 2015-05-21 MED ORDER — OLMESARTAN MEDOXOMIL-HCTZ 20-12.5 MG PO TABS: 1.0000 | ORAL_TABLET | Freq: Every day | ORAL | Status: DC

## 2015-05-21 MED ORDER — ONDANSETRON HCL 4 MG/2ML IJ SOLN
INTRAMUSCULAR | Status: AC
  Filled 2015-05-21: qty 2

## 2015-05-21 MED ORDER — IRBESARTAN 150 MG PO TABS
150.0000 mg | ORAL_TABLET | Freq: Every day | ORAL | Status: DC
  Filled 2015-05-21: qty 1

## 2015-05-21 MED ORDER — LACTATED RINGERS IV SOLN
INTRAVENOUS | Status: DC
  Administered 2015-05-21: 15:00:00 via INTRAVENOUS

## 2015-05-21 MED ORDER — IOHEXOL 350 MG/ML SOLN
80.0000 mL | Freq: Once | INTRAVENOUS | Status: AC | PRN
  Administered 2015-05-21: 80 mL via INTRAVENOUS

## 2015-05-21 MED ORDER — OXYMETAZOLINE HCL 0.05 % NA SOLN
NASAL | Status: DC | PRN
  Administered 2015-05-21: 1

## 2015-05-21 MED ORDER — ONDANSETRON HCL 4 MG/2ML IJ SOLN
INTRAMUSCULAR | Status: DC | PRN
  Administered 2015-05-21: 4 mg via INTRAVENOUS

## 2015-05-21 MED ORDER — PROPOFOL 10 MG/ML IV BOLUS
INTRAVENOUS | Status: AC
  Filled 2015-05-21: qty 20

## 2015-05-21 MED ORDER — FENTANYL CITRATE (PF) 100 MCG/2ML IJ SOLN
INTRAMUSCULAR | Status: DC | PRN
  Administered 2015-05-21: 50 ug via INTRAVENOUS

## 2015-05-21 MED ORDER — 0.9 % SODIUM CHLORIDE (POUR BTL) OPTIME
TOPICAL | Status: DC | PRN
  Administered 2015-05-21: 1000 mL

## 2015-05-21 MED ORDER — BACITRACIN ZINC 500 UNIT/GM EX OINT
TOPICAL_OINTMENT | CUTANEOUS | Status: DC | PRN
  Administered 2015-05-21: 1 via TOPICAL

## 2015-05-21 MED ORDER — PROMETHAZINE HCL 25 MG/ML IJ SOLN: 6.2500 mg | INTRAMUSCULAR | Status: DC | PRN

## 2015-05-21 MED ORDER — BACITRACIN ZINC 500 UNIT/GM EX OINT
TOPICAL_OINTMENT | CUTANEOUS | Status: AC
  Filled 2015-05-21: qty 28.35

## 2015-05-21 MED ORDER — HYDROMORPHONE HCL 1 MG/ML IJ SOLN: 0.2500 mg | INTRAMUSCULAR | Status: DC | PRN

## 2015-05-21 SURGICAL SUPPLY — 31 items
BLADE 10 SAFETY STRL DISP (BLADE) ×3 IMPLANT
BLADE SURG 15 STRL LF DISP TIS (BLADE) IMPLANT
BLADE SURG 15 STRL SS (BLADE)
CANISTER SUCTION 1200CC (MISCELLANEOUS) ×3 IMPLANT
COAGULATOR SUCT 6 FR SWTCH (ELECTROSURGICAL) ×1
COAGULATOR SUCT SWTCH 10FR 6 (ELECTROSURGICAL) ×2 IMPLANT
COVER MAYO STAND STRL (DRAPES) ×1 IMPLANT
DECANTER SPIKE VIAL GLASS SM (MISCELLANEOUS) IMPLANT
DRAPE PROXIMA HALF (DRAPES) ×1 IMPLANT
DRSG NASOPORE 8CM (GAUZE/BANDAGES/DRESSINGS) ×3 IMPLANT
ELECT REM PT RETURN 9FT ADLT (ELECTROSURGICAL) ×3
ELECT REM PT RETURN 9FT PED (ELECTROSURGICAL)
ELECTRODE REM PT RETRN 9FT PED (ELECTROSURGICAL) IMPLANT
ELECTRODE REM PT RTRN 9FT ADLT (ELECTROSURGICAL) ×1 IMPLANT
GAUZE SPONGE 2X2 8PLY STRL LF (GAUZE/BANDAGES/DRESSINGS) IMPLANT
GLOVE ECLIPSE 7.5 STRL STRAW (GLOVE) ×3 IMPLANT
GOWN STRL REUS W/ TWL XL LVL3 (GOWN DISPOSABLE) ×1 IMPLANT
GOWN STRL REUS W/TWL XL LVL3 (GOWN DISPOSABLE) ×3
KIT BASIN OR (CUSTOM PROCEDURE TRAY) ×3 IMPLANT
NEEDLE HYPO 25GX1X1/2 BEV (NEEDLE) IMPLANT
PACK GENERAL/GYN (CUSTOM PROCEDURE TRAY) ×2 IMPLANT
PATTIES SURGICAL 1X1 (DISPOSABLE) IMPLANT
SOLUTION ANTI FOG 6CC (MISCELLANEOUS) ×2 IMPLANT
SPONGE GAUZE 2X2 STER 10/PKG (GAUZE/BANDAGES/DRESSINGS) ×2
SPONGE NEURO XRAY DETECT 1X3 (DISPOSABLE) ×2 IMPLANT
TAPE SURG TRANSPORE 1 IN (GAUZE/BANDAGES/DRESSINGS) IMPLANT
TAPE SURGICAL TRANSPORE 1 IN (GAUZE/BANDAGES/DRESSINGS) ×2
TOWEL OR 17X24 6PK STRL BLUE (TOWEL DISPOSABLE) ×3 IMPLANT
TUBE CONNECTING 12'X1/4 (SUCTIONS) ×1
TUBE CONNECTING 12X1/4 (SUCTIONS) ×2 IMPLANT
WATER STERILE IRR 1000ML POUR (IV SOLUTION) IMPLANT

## 2015-05-21 NOTE — Progress Notes (Signed)
He is still having some bleeding from the left side. He still has pain in the left side. His CT scan was negative of the brain and had some thickening in the left greater than right maxillary sinus. No complete opacification but di not cut down to the bottom of the sinus. He is ready to proceed with the removal of the Flocel and control epistaxis. He understands that the procedure may not stop the pain immediately and he may have more packing.   Exam- packing in left side and no bleeding. No facial swelling. PERRLA. ocop- no lesions.

## 2015-05-21 NOTE — Anesthesia Preprocedure Evaluation (Addendum)
Anesthesia Evaluation  Patient identified by MRN, date of birth, ID band Patient awake    Reviewed: Allergy & Precautions, NPO status , Patient's Chart, lab work & pertinent test results  Airway Mallampati: II  TM Distance: >3 FB Neck ROM: Full    Dental  (+) Edentulous Lower, Teeth Intact, Dental Advisory Given   Pulmonary former smoker,  breath sounds clear to auscultation        Cardiovascular hypertension, Pt. on medications Rhythm:Regular Rate:Normal     Neuro/Psych TIA   GI/Hepatic Neg liver ROS, GERD-  ,  Endo/Other  negative endocrine ROS  Renal/GU negative Renal ROS     Musculoskeletal  (+) Arthritis -,   Abdominal   Peds  Hematology negative hematology ROS (+)   Anesthesia Other Findings   Reproductive/Obstetrics                           Anesthesia Physical Anesthesia Plan  ASA: III  Anesthesia Plan: General   Post-op Pain Management:    Induction: Intravenous  Airway Management Planned: Oral ETT  Additional Equipment:   Intra-op Plan:   Post-operative Plan: Extubation in OR  Informed Consent: I have reviewed the patients History and Physical, chart, labs and discussed the procedure including the risks, benefits and alternatives for the proposed anesthesia with the patient or authorized representative who has indicated his/her understanding and acceptance.   Dental advisory given  Plan Discussed with: CRNA  Anesthesia Plan Comments:        Anesthesia Quick Evaluation

## 2015-05-21 NOTE — Anesthesia Procedure Notes (Signed)
Procedure Name: Intubation Date/Time: 05/21/2015 3:58 PM Performed by: Barrington Ellison Pre-anesthesia Checklist: Patient identified, Emergency Drugs available, Suction available, Patient being monitored and Timeout performed Patient Re-evaluated:Patient Re-evaluated prior to inductionOxygen Delivery Method: Circle system utilized Preoxygenation: Pre-oxygenation with 100% oxygen Intubation Type: IV induction, Rapid sequence and Cricoid Pressure applied Laryngoscope Size: Mac and 3 Grade View: Grade I Tube type: Oral Rae Tube size: 7.5 mm Number of attempts: 1 Airway Equipment and Method: Stylet Placement Confirmation: ETT inserted through vocal cords under direct vision,  positive ETCO2 and breath sounds checked- equal and bilateral Secured at: 21 cm Tube secured with: Tape Dental Injury: Teeth and Oropharynx as per pre-operative assessment

## 2015-05-21 NOTE — Op Note (Signed)
Preop/postop diagnosis: Epistaxis Procedure: Nasal endoscopy with cautery and removal of packing Anesthesia: Gen. Estimated blood loss: Less than 10 mL Indications: 69 year old with a bleeding from the left side that was cauterized on Thursday. He started having bleeding again in severe pain. He was unable to continue the amount of pain he was having an was admitted for pain control and to perform endoscopy as the patient wanted the FloSeal at of his nose. He understood this may not fix his problem and that the pain may just be from the cautery and irritation. He was informed a risk and benefits of the procedure and options were discussed all his questions are answered and consent was obtained. Procedure: Patient was taken to the operating room placed in the supine position after general endotracheal tube anesthesia was draped in the usual sterile manner. The left Murocel pack was removed. The nasal endoscopy was performed and was significant amount of irritation of the nasal cavity on the septum inferior turbinate and floor of the nose. The middle turbinate look normal. The pieces of FloSeal were removed with the [facility] forceps. The septum had multiple oozing sites which were suction cauterized. There was also a couple sites on the inferior turbinate that were cauterized. Because he's on Plavix it was elected to place nasal pore which was soaked in Bactroban. There was no bleeding at the time. The posterior pharynx look clear by nasal endoscopy and was suctioned out of all blood clots. He was awake and brought to recovery room in stable condition counts correct

## 2015-05-21 NOTE — Transfer of Care (Signed)
Immediate Anesthesia Transfer of Care Note  Patient: Timothy Duran  Procedure(s) Performed: Procedure(s): NASAL ENDOSCOPY WITH EPISTAXIS CONTROL (Left)  Patient Location: PACU  Anesthesia Type:General  Level of Consciousness: awake  Airway & Oxygen Therapy: Patient Spontanous Breathing  Post-op Assessment: Report given to RN  Post vital signs: Reviewed and stable  Last Vitals:  Filed Vitals:   05/21/15 0616  BP: 130/76  Pulse: 80  Temp: 36.9 C  Resp: 16    Complications: No apparent anesthesia complications

## 2015-05-22 ENCOUNTER — Encounter: Payer: Self-pay | Admitting: *Deleted

## 2015-05-22 ENCOUNTER — Telehealth: Payer: Self-pay | Admitting: *Deleted

## 2015-05-22 DIAGNOSIS — R040 Epistaxis: Secondary | ICD-10-CM | POA: Diagnosis not present

## 2015-05-22 NOTE — Telephone Encounter (Signed)
Reviewed patient's electronic records and paper chart.  With either TIA or CVA patient should restart Plavix.  I recommended that he restart after 24 hours of no epistaxis.  Hold off on starting ASA for now.   Patient states that he is not sure that he has truly has a TIA or CVA in the past.  I mentioned that I reviewed a report of imaging of the brain from 08/15/2018 done at [facility] in [place].  Scan showed old right basal ganglia lacunar infarct / ischemic stroke.  He states that he never had any type of brain scan at a facility in [place].   I will discuss with his PCP about need to see cardio or neurologist to evaluate need for further therapy and/or clarification of diagnosis.

## 2015-05-22 NOTE — Telephone Encounter (Signed)
Patient stopped by the office after being discharged from the hospital today.  He was having frequent nosebleeds therefore Plavix was discontinued for a week while he was inpatient. On discharge he was to follow up with his PCP, which is Chevis Pretty, to see if he needs to resume use of Plavix. Plavix was initially started greater than 5 years ago because of a "stroke." Reviewing the patient's record I do not see a history of CVA but rather TIA.  What is your opinion about restarting Plavix?

## 2015-05-22 NOTE — Discharge Summary (Signed)
Physician Discharge Summary  Patient ID: Timothy Duran MRN: [id number] DOB/AGE: [birth year] 69 y.o.  Admit date: 05/20/2015 Discharge date: 05/22/2015  Admission Diagnoses: epistaxis  Discharge Diagnoses: same Active Problems:   Epistaxis   Bleeding nose   Discharged Condition: good  Hospital Course: doing well today. No further bleeding and his pain is completely gone. The packing is in the left side and intact. He has no complaints. He was admitted for endoscopic examination of nose and removal of the packing that seemed to be giving him significant pain and he was still bleeding. He is ready for discharge  Consults: None  Significant Diagnostic Studies: none  Treatments: surgery: nasal endoscopy  Discharge Exam: Blood pressure 119/74, pulse 74, temperature 98.4 F (36.9 C), temperature source Oral, resp. rate 16, SpO2 94 %. awake and alert. no bleeding and packing in place. neck withou swelling. cv-rrr lungs- clear abd- soft. ext- no tenderness or swelling  Disposition:   Discharge Instructions    Call MD for:  difficulty breathing, headache or visual disturbances    Complete by:  As directed      Call MD for:  extreme fatigue    Complete by:  As directed      Call MD for:  hives    Complete by:  As directed      Call MD for:  persistant dizziness or light-headedness    Complete by:  As directed      Call MD for:  persistant nausea and vomiting    Complete by:  As directed      Call MD for:  redness, tenderness, or signs of infection (pain, swelling, redness, odor or green/yellow discharge around incision site)    Complete by:  As directed      Call MD for:  severe uncontrolled pain    Complete by:  As directed      Call MD for:  temperature >100.4    Complete by:  As directed      Diet - low sodium heart healthy    Complete by:  As directed      Discharge instructions    Complete by:  As directed   Follow up in 2 weeks. Call if any questions or any further bleeding.  No strenous activity. Call medical doctor about the Plavix.     Increase activity slowly    Complete by:  As directed             Medication List    STOP taking these medications        meclizine 25 MG tablet  Commonly known as:  ANTIVERT      TAKE these medications        acetaminophen 500 MG tablet  Commonly known as:  TYLENOL  Take 1,500 mg by mouth every 3 (three) hours as needed for mild pain or moderate pain.     aspirin 81 MG tablet  Take 81 mg by mouth daily.     clopidogrel 75 MG tablet  Commonly known as:  PLAVIX  TAKE ONE TABLET BY MOUTH ONCE DAILY     lansoprazole 30 MG capsule  Commonly known as:  PREVACID  Take 1 capsule (30 mg total) by mouth daily at 12 noon.     olmesartan-hydrochlorothiazide 20-12.5 MG per tablet  Commonly known as:  BENICAR HCT  Take 1 tablet by mouth daily.         SignedMelissa Montane 05/22/2015, 8:00 AM

## 2015-05-22 NOTE — Progress Notes (Signed)
Discharge instructions given and explained to pt and pt's wife.  Both verbalize understanding of all orders and deny any questions at this time.  IV removed and site CDI.  Pt discharged to home with wife in no s/s of distress with all belongings via volunteer w/c services. Graceann Congress

## 2015-05-26 NOTE — Anesthesia Postprocedure Evaluation (Signed)
  Anesthesia Post-op Note  Patient: Timothy Duran  Procedure(s) Performed: Procedure(s): NASAL ENDOSCOPY WITH EPISTAXIS CONTROL (Left)  Patient Location: PACU  Anesthesia Type:General  Level of Consciousness: awake, alert  and oriented  Airway and Oxygen Therapy: Patient Spontanous Breathing  Post-op Pain: none  Post-op Assessment: Post-op Vital signs reviewed              Post-op Vital Signs: Reviewed  Last Vitals:  Filed Vitals:   05/22/15 0510  BP: 119/74  Pulse: 74  Temp: 36.9 C  Resp: 16    Complications: No apparent anesthesia complications

## 2015-06-18 ENCOUNTER — Emergency Department (HOSPITAL_COMMUNITY)
Admission: EM | Admit: 2015-06-18 | Discharge: 2015-06-18 | Disposition: A | Discharge status: Home / Self Care | Payer: Medicare Other | Source: Home / Self Care | Attending: Emergency Medicine | Admitting: Emergency Medicine

## 2015-06-18 ENCOUNTER — Emergency Department (EMERGENCY_DEPARTMENT_HOSPITAL)
Admit: 2015-06-18 | Discharge: 2015-06-18 | Disposition: A | Discharge status: Home / Self Care | Payer: Medicare Other | Attending: Emergency Medicine | Admitting: Emergency Medicine

## 2015-06-18 ENCOUNTER — Encounter (HOSPITAL_COMMUNITY): Payer: Self-pay

## 2015-06-18 ENCOUNTER — Emergency Department (HOSPITAL_COMMUNITY): Payer: Medicare Other

## 2015-06-18 DIAGNOSIS — Z8546 Personal history of malignant neoplasm of prostate: Secondary | ICD-10-CM

## 2015-06-18 DIAGNOSIS — Z880 Allergy status to penicillin: Secondary | ICD-10-CM

## 2015-06-18 DIAGNOSIS — Z87891 Personal history of nicotine dependence: Secondary | ICD-10-CM

## 2015-06-18 DIAGNOSIS — Z8673 Personal history of transient ischemic attack (TIA), and cerebral infarction without residual deficits: Secondary | ICD-10-CM

## 2015-06-18 DIAGNOSIS — I10 Essential (primary) hypertension: Secondary | ICD-10-CM

## 2015-06-18 DIAGNOSIS — Z8719 Personal history of other diseases of the digestive system: Secondary | ICD-10-CM

## 2015-06-18 DIAGNOSIS — Z85828 Personal history of other malignant neoplasm of skin: Secondary | ICD-10-CM

## 2015-06-18 DIAGNOSIS — Z809 Family history of malignant neoplasm, unspecified: Secondary | ICD-10-CM

## 2015-06-18 DIAGNOSIS — M79605 Pain in left leg: Secondary | ICD-10-CM

## 2015-06-18 DIAGNOSIS — M1990 Unspecified osteoarthritis, unspecified site: Secondary | ICD-10-CM

## 2015-06-18 DIAGNOSIS — Z79899 Other long term (current) drug therapy: Secondary | ICD-10-CM

## 2015-06-18 DIAGNOSIS — K5900 Constipation, unspecified: Secondary | ICD-10-CM | POA: Diagnosis not present

## 2015-06-18 DIAGNOSIS — M25562 Pain in left knee: Secondary | ICD-10-CM | POA: Diagnosis not present

## 2015-06-18 DIAGNOSIS — M25462 Effusion, left knee: Secondary | ICD-10-CM | POA: Insufficient documentation

## 2015-06-18 DIAGNOSIS — L02416 Cutaneous abscess of left lower limb: Secondary | ICD-10-CM | POA: Diagnosis present

## 2015-06-18 DIAGNOSIS — Z7902 Long term (current) use of antithrombotics/antiplatelets: Secondary | ICD-10-CM

## 2015-06-18 DIAGNOSIS — L03116 Cellulitis of left lower limb: Secondary | ICD-10-CM | POA: Diagnosis present

## 2015-06-18 DIAGNOSIS — M109 Gout, unspecified: Secondary | ICD-10-CM | POA: Diagnosis present

## 2015-06-18 DIAGNOSIS — M009 Pyogenic arthritis, unspecified: Principal | ICD-10-CM | POA: Diagnosis present

## 2015-06-18 LAB — BASIC METABOLIC PANEL
ANION GAP: 11 (ref 5–15)
BUN: 15 mg/dL (ref 6–20)
CALCIUM: 10.2 mg/dL (ref 8.9–10.3)
CO2: 28 mmol/L (ref 22–32)
CREATININE: 0.87 mg/dL (ref 0.61–1.24)
Chloride: 101 mmol/L (ref 101–111)
GFR calc non Af Amer: >60 mL/min (ref >60)
Glucose, Bld: 118 mg/dL — ABNORMAL HIGH (ref 65–99)
Potassium: 4.5 mmol/L (ref 3.5–5.1)
SODIUM: 140 mmol/L (ref 135–145)

## 2015-06-18 LAB — CBC WITH DIFFERENTIAL/PLATELET
BASOS ABS: 0.1 10*3/uL (ref 0.0–0.1)
Basophils Relative: 1 % (ref 0–1)
EOS ABS: 0.2 10*3/uL (ref 0.0–0.7)
Eosinophils Relative: 2 % (ref 0–5)
HCT: 43.9 % (ref 39.0–52.0)
Hemoglobin: 14.6 g/dL (ref 13.0–17.0)
Lymphocytes Relative: 14 % (ref 12–46)
Lymphs Abs: 1.6 10*3/uL (ref 0.7–4.0)
MCH: 31.1 pg (ref 26.0–34.0)
MCHC: 33.3 g/dL (ref 30.0–36.0)
MCV: 93.6 fL (ref 78.0–100.0)
MONO ABS: 1 10*3/uL (ref 0.1–1.0)
MONOS PCT: 10 % (ref 3–12)
Neutro Abs: 8.1 10*3/uL — ABNORMAL HIGH (ref 1.7–7.7)
Neutrophils Relative %: 73 % (ref 43–77)
Platelets: 257 10*3/uL (ref 150–400)
RBC: 4.69 MIL/uL (ref 4.22–5.81)
RDW: 13.5 % (ref 11.5–15.5)
WBC: 10.9 10*3/uL — ABNORMAL HIGH (ref 4.0–10.5)

## 2015-06-18 LAB — SYNOVIAL CELL COUNT + DIFF, W/ CRYSTALS
Crystals, Fluid: NONE SEEN
EOSINOPHILS-SYNOVIAL: 0 % (ref 0–1)
Lymphocytes-Synovial Fld: 5 % (ref 0–20)
Monocyte-Macrophage-Synovial Fluid: 8 % — ABNORMAL LOW (ref 50–90)
Neutrophil, Synovial: 87 % — ABNORMAL HIGH (ref 0–25)
WBC, Synovial: UNDETERMINED /mm3 (ref 0–200)

## 2015-06-18 LAB — GRAM STAIN

## 2015-06-18 LAB — SEDIMENTATION RATE: SED RATE: 25 mm/h — AB (ref 0–16)

## 2015-06-18 MED ORDER — HYDROCODONE-ACETAMINOPHEN 5-325 MG PO TABS
1.0000 | ORAL_TABLET | Freq: Once | ORAL | Status: AC
  Administered 2015-06-18: 1 via ORAL
  Filled 2015-06-18: qty 1

## 2015-06-18 MED ORDER — LIDOCAINE HCL (PF) 1 % IJ SOLN
5.0000 mL | Freq: Once | INTRAMUSCULAR | Status: AC
  Administered 2015-06-18: 5 mL via INTRADERMAL
  Filled 2015-06-18: qty 5

## 2015-06-18 MED ORDER — HYDROCODONE-ACETAMINOPHEN 5-325 MG PO TABS
2.0000 | ORAL_TABLET | Freq: Once | ORAL | Status: AC
  Administered 2015-06-18: 2 via ORAL
  Filled 2015-06-18: qty 2

## 2015-06-18 MED ORDER — HYDROCODONE-ACETAMINOPHEN 5-325 MG PO TABS: 2.0000 | ORAL_TABLET | ORAL | Status: DC | PRN

## 2015-06-18 NOTE — ED Notes (Signed)
Ultrasound at bedside

## 2015-06-18 NOTE — ED Notes (Signed)
Bed: WA03 Expected date:  Expected time:  Means of arrival:  Comments: EMS- 69yo F, Hip pain

## 2015-06-18 NOTE — ED Notes (Signed)
Ace wrap to lt knee.

## 2015-06-18 NOTE — ED Provider Notes (Signed)
CSN: [id number]     Arrival date & time 06/18/15  0916 History   First MD Initiated Contact with Patient 06/18/15 [phone]     Chief Complaint  Patient presents with  . Leg Pain     (Consider location/radiation/quality/duration/timing/severity/associated sxs/prior Treatment) HPI Comments: 69 year old male with history of high blood pressure, TIA, epistaxis, on Plavix presents with left posterior and lateral knee and calf tenderness worsened for the past 10 days however more significant today. Patient was admitted in the hospital recently and had cautery of a nosebleed. Patient was taken off Plavix and restarted. Patient denies any injuries. Worse with movement left knee and palpation. No cold leg or weakness or numbness. No back issues.  Patient is a 69 y.o. male presenting with leg pain. The history is provided by the patient.  Leg Pain Associated symptoms: no back pain, no fever and no neck pain     Past Medical History  Diagnosis Date  . Hypertension   . Abdominal hernia     "in there now" (05/20/2015)  . TIA (transient ischemic attack) 2004  . Arthritis     "terrible; all over" (05/20/2015)  . Prostate cancer   . Basal cell carcinoma     "left neck"  . Epistaxis 05/20/2015    hospitalized   Past Surgical History  Procedure Laterality Date  . Video assisted thoracoscopy (vats)/wedge resection Right 10/2009    "upper; benign"  . Prostatectomy  01/2010  . Cystoscopy w/ stone manipulation  1994    "got one stone; still has one lodge in there" (05/20/2015)  . Bladder stone removal  09/2009  . Basal cell carcinoma excision Left 2015    "neck"  . Nasal endoscopy with epistaxis control Left 05/21/2015    Procedure: NASAL ENDOSCOPY WITH EPISTAXIS CONTROL;  Surgeon: Melissa Montane, MD;  Location: [facility];  Service: ENT;  Laterality: Left;   History reviewed. No pertinent family history. Social History  Substance Use Topics  . Smoking status: Former Smoker -- 1.00 packs/day for 4 years    Types:  Cigarettes    Quit date: 08/29/2008  . Smokeless tobacco: Never Used  . Alcohol Use: No    Review of Systems  Constitutional: Negative for fever and chills.  HENT: Negative for congestion.   Eyes: Negative for visual disturbance.  Respiratory: Negative for shortness of breath.   Cardiovascular: Positive for leg swelling. Negative for chest pain.  Gastrointestinal: Negative for vomiting and abdominal pain.  Genitourinary: Negative for dysuria and flank pain.  Musculoskeletal: Positive for joint swelling and arthralgias. Negative for back pain, neck pain and neck stiffness.  Skin: Negative for rash.  Neurological: Negative for light-headedness and headaches.      Allergies  Other and Penicillins  Home Medications   Prior to Admission medications   Medication Sig Start Date End Date Taking? Authorizing Provider  acetaminophen (TYLENOL) 500 MG tablet Take 1,500 mg by mouth as needed for mild pain or moderate pain.    Yes Historical Provider, MD  clopidogrel (PLAVIX) 75 MG tablet TAKE ONE TABLET BY MOUTH ONCE DAILY 04/08/15  Yes Mary-Margaret Hassell Done, FNP  olmesartan-hydrochlorothiazide (BENICAR HCT) 20-12.5 MG per tablet Take 1 tablet by mouth daily. Patient taking differently: Take 0.5 tablets by mouth daily.  05/13/15  Yes Mary-Margaret Hassell Done, FNP  HYDROcodone-acetaminophen (NORCO) 5-325 MG per tablet Take 2 tablets by mouth every 4 (four) hours as needed. 06/18/15   Elnora Morrison, MD  lansoprazole (PREVACID) 30 MG capsule Take 1 capsule (30 mg total)  by mouth daily at 12 noon. Patient not taking: Reported on 06/18/2015 05/13/15   Mary-Margaret Hassell Done, FNP   BP 111/65 mmHg  Pulse 67  Temp(Src) 97.9 F (36.6 C) (Oral)  Resp 18  Ht 5\' 8"  (1.727 m)  Wt 189 lb (85.73 kg)  BMI 28.74 kg/m2  SpO2 97% Physical Exam  Constitutional: He is oriented to person, place, and time. He appears well-developed and well-nourished.  HENT:  Head: Normocephalic and atraumatic.  Eyes: Conjunctivae  are normal. Right eye exhibits no discharge. Left eye exhibits no discharge.  Neck: Normal range of motion. Neck supple. No tracheal deviation present.  Cardiovascular: Normal rate.   Pulmonary/Chest: Effort normal.  Abdominal: Soft.  Musculoskeletal: He exhibits edema and tenderness.  Patient has tenderness bilateral joint line with mild effusion left knee, significant pain with any flexion, decreased range of motion due to pain and swelling. Patient also has tenderness in the left popliteal region and upper calf. 2+ pulses distally sensation and strength intact 5+.  Neurological: He is alert and oriented to person, place, and time.  Skin: Skin is warm. No rash noted.  Psychiatric: He has a normal mood and affect.  Nursing note and vitals reviewed.   ED Course  ARTHOCENTESIS Date/Time: 06/18/2015 1:03 PM Performed by: Elnora Morrison Authorized by: Elnora Morrison Consent: Verbal consent obtained. Consent given by: patient Patient understanding: patient states understanding of the procedure being performed Patient identity confirmed: verbally with patient Time out: Immediately prior to procedure a "time out" was called to verify the correct patient, procedure, equipment, support staff and site/side marked as required. Indications: joint swelling,  pain and possible septic joint  Body area: knee Joint: left knee Local anesthesia used: yes Local anesthetic: lidocaine 1% without epinephrine Anesthetic total: 4 ml Patient sedated: no Preparation: Patient was prepped and draped in the usual sterile fashion. Needle gauge: 20 G Ultrasound guidance: yes Approach: lateral Aspirate: blood-tinged and yellow Patient tolerance: Patient tolerated the procedure well with no immediate complications   Labs Review Labs Reviewed  CBC WITH DIFFERENTIAL/PLATELET - Abnormal; Notable for the following:    WBC 10.9 (*)    Neutro Abs 8.1 (*)    All other components within normal limits  BASIC  METABOLIC PANEL - Abnormal; Notable for the following:    Glucose, Bld 118 (*)    All other components within normal limits  SEDIMENTATION RATE - Abnormal; Notable for the following:    Sed Rate 25 (*)    All other components within normal limits  SYNOVIAL CELL COUNT + DIFF, W/ CRYSTALS - Abnormal; Notable for the following:    Appearance-Synovial CLOUDY (*)    Neutrophil, Synovial 87 (*)    Monocyte-Macrophage-Synovial Fluid 8 (*)    All other components within normal limits  BODY FLUID CULTURE    Imaging Review Dg Knee Complete 4 Views Left  06/18/2015   CLINICAL DATA:  69 year old male with knee pain. No injury. Initial encounter.  EXAM: LEFT KNEE - COMPLETE 4+ VIEW  COMPARISON:  None.  FINDINGS: Mild tricompartment degenerative changes.  Chondrocalcinosis (CPPD) changes.  Moderate size suprapatellar joint effusion.  Vascular calcifications.  No fracture or dislocation.  IMPRESSION: Mild tricompartment degenerative changes.  Chondrocalcinosis (CPPD) changes.  Moderate size suprapatellar joint effusion.   Electronically Signed   By: Genia Del M.D.   On: 06/18/2015 10:23   I have personally reviewed and evaluated these images and lab results as part of my medical decision-making.   EKG Interpretation None  MDM   Final diagnoses:  Leg pain, left  Knee effusion, left   Concern most likely for left knee pain/effusion as cause of presentation however recent hospitalization and calf tenderness plan for  ultrasound to look for blood clot. Pain medicines ordered. X-Seann ordered.  Discussed with ultrasound tach no blood clot. Knee aspiration of joint after consent given. Sample sent to lab pending results. Delay with lab, discussed with lab tach who said due to a blood clot within the fluid their protocol would not allow them to run the cell count.  Discussed with Dr. Percell Miller on call for orthopedics will follow the patient closely outpatient. Strict reasons to follow-up earlier into  the ER given to patient. Will hold metabolic set this time. Results and differential diagnosis were discussed with the patient/parent/guardian. Xrays were independently reviewed by myself.  Close follow up outpatient was discussed, comfortable with the plan.   Medications  HYDROcodone-acetaminophen (NORCO/VICODIN) 5-325 MG per tablet 2 tablet (2 tablets Oral Given 06/18/15 1010)  lidocaine (PF) (XYLOCAINE) 1 % injection 5 mL (5 mLs Intradermal Given 06/18/15 1150)  HYDROcodone-acetaminophen (NORCO/VICODIN) 5-325 MG per tablet 1 tablet (1 tablet Oral Given 06/18/15 1350)    Filed Vitals:   06/18/15 0924 06/18/15 1350  BP: 118/97 111/65  Pulse: 98 67  Temp: 97.9 F (36.6 C)   TempSrc: Oral   Resp: 20 18  Height: 5\' 8"  (1.727 m)   Weight: 189 lb (85.73 kg)   SpO2: 98% 97%    Final diagnoses:  Leg pain, left  Knee effusion, left        Elnora Morrison, MD 06/18/15 [phone]

## 2015-06-18 NOTE — Discharge Instructions (Signed)
Call the orthopedic office for appointment Friday morning or sooner if your pain gets worse you develop worsening swelling or fevers. They will follow up the culture results for you for next treatment options.  For severe pain take norco or vicodin however realize they have the potential for addiction and it can make you sleepy and has tylenol in it.  No operating machinery while taking. If you were given medicines take as directed.  If you are on coumadin or contraceptives realize their levels and effectiveness is altered by many different medicines.  If you have any reaction (rash, tongues swelling, other) to the medicines stop taking and see a physician.    If your blood pressure was elevated in the ER make sure you follow up for management with a primary doctor or return for chest pain, shortness of breath or stroke symptoms.  Please follow up as directed and return to the ER or see a physician for new or worsening symptoms.  Thank you. Filed Vitals:   06/18/15 0924  BP: 118/97  Pulse: 98  Temp: 97.9 F (36.6 C)  TempSrc: Oral  Resp: 20  Height: 5\' 8"  (1.727 m)  Weight: 189 lb (85.73 kg)  SpO2: 98%

## 2015-06-18 NOTE — Progress Notes (Signed)
*  Preliminary Results* Left lower extremity venous duplex completed. Left lower extremity is negative for deep vein thrombosis. There is no evidence of left Baker's cyst. The left distal popliteal vein exhibits elevated velocities >200cm/sec; etiology unknown.  06/18/2015 10:43 AM  Maudry Mayhew, RVT, RDCS, RDMS

## 2015-06-18 NOTE — ED Notes (Signed)
Pt states hx of pain in left leg d/t injury years ago.  However, pain yesterday has become severe to left leg and knee.  Swollen and red.  Pt states no recent travel.  Painful to bed/ambulate

## 2015-06-19 ENCOUNTER — Encounter (HOSPITAL_COMMUNITY): Payer: Self-pay | Admitting: Emergency Medicine

## 2015-06-19 ENCOUNTER — Inpatient Hospital Stay (HOSPITAL_COMMUNITY)
Admission: EM | Admit: 2015-06-19 | Discharge: 2015-06-26 | DRG: 549 | Disposition: A | Discharge status: Home Health | Payer: Medicare Other | Attending: Specialist | Admitting: Specialist

## 2015-06-19 ENCOUNTER — Inpatient Hospital Stay: Admit: 2015-06-19 | Payer: Self-pay | Admitting: Orthopedic Surgery

## 2015-06-19 DIAGNOSIS — M009 Pyogenic arthritis, unspecified: Secondary | ICD-10-CM | POA: Diagnosis present

## 2015-06-19 LAB — CBC WITH DIFFERENTIAL/PLATELET
Basophils Absolute: 0.1 10*3/uL (ref 0.0–0.1)
Basophils Relative: 1 % (ref 0–1)
EOS ABS: 0.2 10*3/uL (ref 0.0–0.7)
EOS PCT: 1 % (ref 0–5)
HCT: 44.8 % (ref 39.0–52.0)
HEMOGLOBIN: 15.2 g/dL (ref 13.0–17.0)
LYMPHS ABS: 2.2 10*3/uL (ref 0.7–4.0)
Lymphocytes Relative: 15 % (ref 12–46)
MCH: 31.7 pg (ref 26.0–34.0)
MCHC: 33.9 g/dL (ref 30.0–36.0)
MCV: 93.3 fL (ref 78.0–100.0)
MONO ABS: 1.2 10*3/uL — AB (ref 0.1–1.0)
MONOS PCT: 8 % (ref 3–12)
Neutro Abs: 11.1 10*3/uL — ABNORMAL HIGH (ref 1.7–7.7)
Neutrophils Relative %: 75 % (ref 43–77)
PLATELETS: 264 10*3/uL (ref 150–400)
RBC: 4.8 MIL/uL (ref 4.22–5.81)
RDW: 13.3 % (ref 11.5–15.5)
WBC: 14.7 10*3/uL — ABNORMAL HIGH (ref 4.0–10.5)

## 2015-06-19 LAB — BASIC METABOLIC PANEL
Anion gap: 9 (ref 5–15)
BUN: 17 mg/dL (ref 6–20)
CHLORIDE: 104 mmol/L (ref 101–111)
CO2: 23 mmol/L (ref 22–32)
CREATININE: 0.78 mg/dL (ref 0.61–1.24)
Calcium: 10.3 mg/dL (ref 8.9–10.3)
GFR calc Af Amer: >60 mL/min (ref >60)
GFR calc non Af Amer: >60 mL/min (ref >60)
GLUCOSE: 116 mg/dL — AB (ref 65–99)
Potassium: 4.3 mmol/L (ref 3.5–5.1)
Sodium: 136 mmol/L (ref 135–145)

## 2015-06-19 LAB — SEDIMENTATION RATE: SED RATE: 34 mm/h — AB (ref 0–16)

## 2015-06-19 LAB — C-REACTIVE PROTEIN: CRP: 13.3 mg/dL — ABNORMAL HIGH (ref <1.0)

## 2015-06-19 LAB — SYNOVIAL CELL COUNT + DIFF, W/ CRYSTALS
CRYSTALS FLUID: NONE SEEN
Eosinophils-Synovial: 0 % (ref 0–1)
Lymphocytes-Synovial Fld: 1 % (ref 0–20)
MONOCYTE-MACROPHAGE-SYNOVIAL FLUID: 10 % — AB (ref 50–90)
Neutrophil, Synovial: 89 % — ABNORMAL HIGH (ref 0–25)
WBC, SYNOVIAL: UNDETERMINED /mm3 (ref 0–200)

## 2015-06-19 LAB — CBG MONITORING, ED: Glucose-Capillary: 98 mg/dL (ref 65–99)

## 2015-06-19 LAB — I-STAT CG4 LACTIC ACID, ED
Lactic Acid, Venous: 1.25 mmol/L (ref 0.5–2.0)
Lactic Acid, Venous: 0.79 mmol/L (ref 0.5–2.0)

## 2015-06-19 MED ORDER — SODIUM CHLORIDE 0.9 % IV BOLUS (SEPSIS)
1000.0000 mL | Freq: Once | INTRAVENOUS | Status: AC
  Administered 2015-06-19: 1000 mL via INTRAVENOUS

## 2015-06-19 MED ORDER — PROPOFOL 10 MG/ML IV BOLUS
INTRAVENOUS | Status: AC
  Filled 2015-06-19: qty 20

## 2015-06-19 MED ORDER — VANCOMYCIN HCL IN DEXTROSE 1-5 GM/200ML-% IV SOLN: 1000.0000 mg | Freq: Once | INTRAVENOUS | Status: DC

## 2015-06-19 MED ORDER — FENTANYL CITRATE (PF) 100 MCG/2ML IJ SOLN
INTRAMUSCULAR | Status: AC
  Filled 2015-06-19: qty 4

## 2015-06-19 MED ORDER — VANCOMYCIN HCL 10 G IV SOLR
1500.0000 mg | Freq: Two times a day (BID) | INTRAVENOUS | Status: DC
  Administered 2015-06-19: 1500 mg via INTRAVENOUS
  Filled 2015-06-19: qty 1500

## 2015-06-19 NOTE — ED Notes (Signed)
All personal belongings sent with patient's family members including lower dentures.

## 2015-06-19 NOTE — H&P (Addendum)
Knee pain is described as the following: The patient reports left knee symptoms including: pain, swelling, giving way and weakness which began 2 week(s) ago without any known injury. The patient describes the severity of the symptoms as 10 / 10. The patient describes their pain as throbbing.The patient feels that the symptoms are unchanging. Prior to being seen today the patient was previously evaluated in the emergency room. Previous work-up for this problem has included knee x-rays. Symptoms are reported to be located in the left medial knee and left posterior knee and include knee pain, medial knee pain, swelling, decreased range of motion, difficulty bearing weight and difficulty ambulating. The patient reports that symptoms radiate to the left lower leg. Onset of symptoms was gradual. Symptoms are exacerbated by walking. Current treatment includes elastic bandage and opioid analgesics. Symptoms present at the patient's previous evaluation included knee pain, swelling, decreased range of motion and difficulty bearing weight. Note for "Knee pain": He was seen in the ED and knee was aspirated. They sent fluid for culture.  Allergies  Penicillins  Family History  Cancer Brother, Father, Sister.  Social History  Children 2 Current work status retired Living situation live with spouse Marital status married Never consumed alcohol 06/19/2015: Never consumed alcohol No history of drug/alcohol rehab Not under pain contract Tobacco / smoke exposure 06/19/2015: no Tobacco use Former smoker. 06/19/2015: smoke(d) 1 pack(s) per day  Medication History Benicar HCT (20-12.5MG  Tablet, Oral) Active. Clopidogrel Bisulfate (75MG  Tablet, Oral) Active. Medications Reconciled  Past Surgical History Gallbladder Surgery open and laporoscopic Lung Surgery  right Other Orthopaedic Surgery Prostatectomy; Abdominal Sinus Surgery  Other Problems  Cerebrovascular Accident High blood  pressure Prostate Cancer  Review of Systems HEENT Present- Nose Bleed. Not Present- Hearing problems, Ringing in the Ears and Sensitivity to light. Cardiovascular Present- Leg Cramps. Not Present- Chest Pain, Palpitations, Shortness of Breath and Swelling of Extremities. Musculoskeletal Present- Muscle Pain. Not Present- Back Pain, Joint Pain, Joint Stiffness, Joint Swelling and Muscle Weakness.  Exam NO SOB/CP Abd soft/NT Compartments soft/NT EHL/TA/GA intact Sensation to light touch intact  2+ DP/PT bilaterally Left knee: swollen, painful and warm to touch.  Unable to flex due to pain No abrasion/laceration  Plan: Patient with swollen painful knee with fever and tachycardia.  Concern for septic process Will admit and take to OR for arthroscopic I&D Discussed with patient and family Risks: infection, bleeding, death, stroke, paralysis, need for additional surgery, arthritis, ongoing or worse pain. ID consult requested - will see in AM PICC line ordered for AM Dr Tonita Cong to take over care in AM

## 2015-06-19 NOTE — Anesthesia Preprocedure Evaluation (Addendum)
Anesthesia Evaluation  Patient identified by MRN, date of birth, ID band Patient awake    Reviewed: Allergy & Precautions, H&P , NPO status , Patient's Chart, lab work & pertinent test results  Airway Mallampati: II  TM Distance: >3 FB Neck ROM: full    Dental  (+) Dental Advisory Given, Caps, Edentulous Lower All upper front are capped:   Pulmonary neg pulmonary ROS, former smoker,  VATS with wedge resection breath sounds clear to auscultation  Pulmonary exam normal       Cardiovascular Exercise Tolerance: Good hypertension, Pt. on medications Normal cardiovascular examRhythm:regular Rate:Normal     Neuro/Psych TIAnegative neurological ROS  negative psych ROS   GI/Hepatic negative GI ROS, Neg liver ROS,   Endo/Other  negative endocrine ROS  Renal/GU negative Renal ROS  negative genitourinary   Musculoskeletal   Abdominal   Peds  Hematology negative hematology ROS (+)   Anesthesia Other Findings epistaxis  Reproductive/Obstetrics negative OB ROS                            Anesthesia Physical Anesthesia Plan  ASA: III  Anesthesia Plan: General   Post-op Pain Management:    Induction: Intravenous  Airway Management Planned: LMA  Additional Equipment:   Intra-op Plan:   Post-operative Plan:   Informed Consent: I have reviewed the patients History and Physical, chart, labs and discussed the procedure including the risks, benefits and alternatives for the proposed anesthesia with the patient or authorized representative who has indicated his/her understanding and acceptance.   Dental Advisory Given  Plan Discussed with: CRNA and Surgeon  Anesthesia Plan Comments:        Anesthesia Quick Evaluation

## 2015-06-19 NOTE — Progress Notes (Signed)
ANTIBIOTIC CONSULT NOTE  Pharmacy Consult for Vancomycin Indication: septic knee  Allergies  Allergen Reactions  . Other     Nasal packing- headaches and facial pain  . Penicillins     Blisters     Patient Measurements:    Vital Signs: Temp: 98.6 F (37 C) (08/31 1845) Temp Source: Oral (08/31 1845) BP: 125/77 mmHg (08/31 1845) Pulse Rate: 125 (08/31 1845) Intake/Output from previous day:   Intake/Output from this shift:    Labs:  Recent Labs  06/18/15 0957 06/19/15 1901  WBC 10.9* 14.7*  HGB 14.6 15.2  PLT 257 264  CREATININE 0.87 0.78   Estimated Creatinine Clearance: 90.2 mL/min (by C-G formula based on Cr of 0.78). No results for input(s): VANCOTROUGH, VANCOPEAK, VANCORANDOM, GENTTROUGH, GENTPEAK, GENTRANDOM, TOBRATROUGH, TOBRAPEAK, TOBRARND, AMIKACINPEAK, AMIKACINTROU, AMIKACIN in the last 72 hours.   Microbiology: Recent Results (from the past 720 hour(s))  Culture, body fluid-bottle     Status: None (Preliminary result)   Collection Time: 06/18/15 12:18 PM  Result Value Ref Range Status   Specimen Description FLUID SYNOVIAL LEFT KNEE  Final   Special Requests NONE  Final   Culture   Final    NO GROWTH < 24 HOURS Performed at [facility]    Report Status PENDING  Incomplete  Gram stain     Status: None   Collection Time: 06/18/15 12:19 PM  Result Value Ref Range Status   Specimen Description FLUID SYNOVIAL LEFT KNEE  Final   Special Requests NONE  Final   Gram Stain   Final    WBC PRESENT,BOTH PMN AND MONONUCLEAR NO ORGANISMS SEEN CYTOSPIN Performed at [facility]    Report Status 06/18/2015 FINAL  Final    Anti-infectives    Start     Dose/Rate Route Frequency Ordered Stop   06/19/15 2015  vancomycin (VANCOCIN) IVPB 1000 mg/200 mL premix     1,000 mg 200 mL/hr over 60 Minutes Intravenous  Once 06/19/15 2008        Assessment: 69 yo M who presented to ED 8/30 with L knee pain that has worsened over past 10 days.  He has  remote hx of knee injury.  Knee was aspirated and patient was discharged with plan for orthopedic follow-up.  Orthopedic physician sent patient back to ED after office visit.  Pharmacy was asked to initiate IV Vancomycin for possible septic arthritis.   He has low grade fever (Tm 100.7) and WBC elevated/ increased from yesterday.  LA is wnl.   Renal function is at patient's baseline.  Estimated CrCl > 63ml/min.   8/31>>Vancomycin  >>    8/30: Body fluid (knee asp): IP 8/31: Body fluid (knee asp): IP  Dose changes/levels:  Goal of Therapy:  Vancomycin trough level 15-20 mcg/ml  Plan:  Vancomycin 1500mg  IV q12h Check Vancomycin trough at steady state Monitor renal function and cx data   Biagio Borg 06/19/2015,8:08 PM

## 2015-06-19 NOTE — ED Provider Notes (Signed)
CSN: [id number]     Arrival date & time 06/19/15  1823 History   First MD Initiated Contact with Patient 06/19/15 1939     Chief Complaint  Patient presents with  . Knee Pain     (Consider location/radiation/quality/duration/timing/severity/associated sxs/prior Treatment) HPI Comments: Patient presents to the emergency department with a chief complaint left knee pain. Patient states that he was seen by his orthopedic doctor, Dr. Tonita Cong today and was sent to the emergency department for admission for left knee cellulitis versus septic arthritis. Patient states that he has unable to move the knee at all without pain. He denies any fevers or chills. He has not taken anything to alleviate the symptoms. Patient was seen yesterday and had his knee aspirated. Gram stain from yesterday shows no organisms, but is remarkable for white blood cells.  The history is provided by the patient. No language interpreter was used.    Past Medical History  Diagnosis Date  . Hypertension   . Abdominal hernia     "in there now" (05/20/2015)  . TIA (transient ischemic attack) 2004  . Arthritis     "terrible; all over" (05/20/2015)  . Prostate cancer   . Basal cell carcinoma     "left neck"  . Epistaxis 05/20/2015    hospitalized   Past Surgical History  Procedure Laterality Date  . Video assisted thoracoscopy (vats)/wedge resection Right 10/2009    "upper; benign"  . Prostatectomy  01/2010  . Cystoscopy w/ stone manipulation  1994    "got one stone; still has one lodge in there" (05/20/2015)  . Bladder stone removal  09/2009  . Basal cell carcinoma excision Left 2015    "neck"  . Nasal endoscopy with epistaxis control Left 05/21/2015    Procedure: NASAL ENDOSCOPY WITH EPISTAXIS CONTROL;  Surgeon: Melissa Montane, MD;  Location: [facility];  Service: ENT;  Laterality: Left;   No family history on file. Social History  Substance Use Topics  . Smoking status: Former Smoker -- 1.00 packs/day for 4 years    Types:  Cigarettes    Quit date: 08/29/2008  . Smokeless tobacco: Never Used  . Alcohol Use: No    Review of Systems  Constitutional: Negative for fever and chills.  Respiratory: Negative for shortness of breath.   Cardiovascular: Negative for chest pain.  Gastrointestinal: Negative for nausea, vomiting, diarrhea and constipation.  Genitourinary: Negative for dysuria.  Musculoskeletal: Positive for arthralgias.  Skin: Positive for color change.      Allergies  Other and Penicillins  Home Medications   Prior to Admission medications   Medication Sig Start Date End Date Taking? Authorizing Provider  acetaminophen (TYLENOL) 500 MG tablet Take 1,500 mg by mouth as needed for mild pain or moderate pain.    Yes Historical Provider, MD  clopidogrel (PLAVIX) 75 MG tablet TAKE ONE TABLET BY MOUTH ONCE DAILY 04/08/15  Yes Mary-Margaret Hassell Done, FNP  HYDROcodone-acetaminophen (NORCO) 5-325 MG per tablet Take 2 tablets by mouth every 4 (four) hours as needed. Patient taking differently: Take 2 tablets by mouth every 4 (four) hours as needed for moderate pain.  06/18/15  Yes Elnora Morrison, MD  olmesartan-hydrochlorothiazide (BENICAR HCT) 20-12.5 MG per tablet Take 1 tablet by mouth daily. Patient taking differently: Take 0.5 tablets by mouth daily.  05/13/15  Yes Mary-Margaret Hassell Done, FNP  lansoprazole (PREVACID) 30 MG capsule Take 1 capsule (30 mg total) by mouth daily at 12 noon. Patient not taking: Reported on 06/18/2015 05/13/15   Mary-Margaret Hassell Done, FNP  BP 125/77 mmHg  Pulse 125  Temp(Src) 98.6 F (37 C) (Oral)  Resp 20  SpO2 93% Physical Exam  Constitutional: He is oriented to person, place, and time. He appears well-developed and well-nourished.  HENT:  Head: Normocephalic and atraumatic.  Eyes: Conjunctivae and EOM are normal. Pupils are equal, round, and reactive to light. Right eye exhibits no discharge. Left eye exhibits no discharge. No scleral icterus.  Neck: Normal range of  motion. Neck supple. No JVD present.  Cardiovascular: Normal rate, regular rhythm and normal heart sounds.  Exam reveals no gallop and no friction rub.   No murmur heard. Pulmonary/Chest: Effort normal and breath sounds normal. No respiratory distress. He has no wheezes. He has no rales. He exhibits no tenderness.  Abdominal: Soft. He exhibits no distension and no mass. There is no tenderness. There is no rebound and no guarding.  Musculoskeletal: Normal range of motion. He exhibits no edema or tenderness.  Left knee tender to palpation, significant pain with movement  Neurological: He is alert and oriented to person, place, and time.  Skin: Skin is warm and dry.  Left knee mildly erythematous, warm to touch  Psychiatric: He has a normal mood and affect. His behavior is normal. Judgment and thought content normal.  Nursing note and vitals reviewed.   ED Course  Procedures (including critical care time) Results for orders placed or performed during the hospital encounter of 06/19/15  Body fluid culture  Result Value Ref Range   Specimen Description KNEE LEFT    Special Requests NONE    Gram Stain      WBC PRESENT, PREDOMINANTLY PMN NO ORGANISMS SEEN CYTOSPIN Gram Stain Report Called to,Read Back By and Verified With: Cathe Mons [phone] @ 2149 BY J SCOTTON    Culture PENDING    Report Status PENDING   Blood culture (routine x 2)  Result Value Ref Range   Specimen Description      BLOOD RIGHT FOREARM Performed at [facility]    Special Requests BOTTLES DRAWN AEROBIC AND ANAEROBIC 5ML    Culture PENDING    Report Status PENDING   Basic metabolic panel  Result Value Ref Range   Sodium 136 135 - 145 mmol/L   Potassium 4.3 3.5 - 5.1 mmol/L   Chloride 104 101 - 111 mmol/L   CO2 23 22 - 32 mmol/L   Glucose, Bld 116 (H) 65 - 99 mg/dL   BUN 17 6 - 20 mg/dL   Creatinine, Ser 0.78 0.61 - 1.24 mg/dL   Calcium 10.3 8.9 - 10.3 mg/dL   GFR calc non Af Amer >60 >60 mL/min    GFR calc Af Amer >60 >60 mL/min   Anion gap 9 5 - 15  CBC with Differential  Result Value Ref Range   WBC 14.7 (H) 4.0 - 10.5 K/uL   RBC 4.80 4.22 - 5.81 MIL/uL   Hemoglobin 15.2 13.0 - 17.0 g/dL   HCT 44.8 39.0 - 52.0 %   MCV 93.3 78.0 - 100.0 fL   MCH 31.7 26.0 - 34.0 pg   MCHC 33.9 30.0 - 36.0 g/dL   RDW 13.3 11.5 - 15.5 %   Platelets 264 150 - 400 K/uL   Neutrophils Relative % 75 43 - 77 %   Neutro Abs 11.1 (H) 1.7 - 7.7 K/uL   Lymphocytes Relative 15 12 - 46 %   Lymphs Abs 2.2 0.7 - 4.0 K/uL   Monocytes Relative 8 3 - 12 %  Monocytes Absolute 1.2 (H) 0.1 - 1.0 K/uL   Eosinophils Relative 1 0 - 5 %   Eosinophils Absolute 0.2 0.0 - 0.7 K/uL   Basophils Relative 1 0 - 1 %   Basophils Absolute 0.1 0.0 - 0.1 K/uL  Cell count + diff,  w/ cryst-synvl fld  Result Value Ref Range   Color, Synovial YELLOW YELLOW   Appearance-Synovial CLOUDY (A) CLEAR   Crystals, Fluid NO CRYSTALS SEEN    WBC, Synovial UNABLE TO PERFORM COUNT DUE TO CLOT IN SPECIMEN 0 - 200 /cu mm   Neutrophil, Synovial 89 (H) 0 - 25 %   Lymphocytes-Synovial Fld 1 0 - 20 %   Monocyte-Macrophage-Synovial Fluid 10 (L) 50 - 90 %   Eosinophils-Synovial 0 0 - 1 %  Sedimentation rate  Result Value Ref Range   Sed Rate 34 (H) 0 - 16 mm/hr  C-reactive protein  Result Value Ref Range   CRP 13.3 (H) <1.0 mg/dL  CBG monitoring, ED  Result Value Ref Range   Glucose-Capillary 98 65 - 99 mg/dL  I-Stat CG4 Lactic Acid, ED  Result Value Ref Range   Lactic Acid, Venous 1.25 0.5 - 2.0 mmol/L  I-Stat CG4 Lactic Acid, ED  Result Value Ref Range   Lactic Acid, Venous 0.79 0.5 - 2.0 mmol/L   Dg Knee Complete 4 Views Left  06/18/2015   CLINICAL DATA:  69 year old male with knee pain. No injury. Initial encounter.  EXAM: LEFT KNEE - COMPLETE 4+ VIEW  COMPARISON:  None.  FINDINGS: Mild tricompartment degenerative changes.  Chondrocalcinosis (CPPD) changes.  Moderate size suprapatellar joint effusion.  Vascular calcifications.  No  fracture or dislocation.  IMPRESSION: Mild tricompartment degenerative changes.  Chondrocalcinosis (CPPD) changes.  Moderate size suprapatellar joint effusion.   Electronically Signed   By: Genia Del M.D.   On: 06/18/2015 10:23     Imaging Review Dg Knee Complete 4 Views Left  06/18/2015   CLINICAL DATA:  69 year old male with knee pain. No injury. Initial encounter.  EXAM: LEFT KNEE - COMPLETE 4+ VIEW  COMPARISON:  None.  FINDINGS: Mild tricompartment degenerative changes.  Chondrocalcinosis (CPPD) changes.  Moderate size suprapatellar joint effusion.  Vascular calcifications.  No fracture or dislocation.  IMPRESSION: Mild tricompartment degenerative changes.  Chondrocalcinosis (CPPD) changes.  Moderate size suprapatellar joint effusion.   Electronically Signed   By: Genia Del M.D.   On: 06/18/2015 10:23   I have personally reviewed and evaluated these images and lab results as part of my medical decision-making.   EKG Interpretation None      MDM   Final diagnoses:  Septic arthritis    Patient sent to emergency department by Dr. Tonita Cong. Concern for septic arthritis versus cellulitis of left knee. Gram stain reviewed from yesterday, no organisms, but there is large amount of white blood cells present. Dr. Tonita Cong aspirated the knee again today. The patient comes with body fluid samples.  Patient discussed with Dr. Colin Rhein, who recommends consulting with [facility]  Patient may need to be washed out in the OR.  Febrile to 100.7, tachy to 125 on arrival. Vanc and fluids have been given.  ESR trending up along with leukocytosis.  Cloudy synovial fluid with WBCs on gram stain.  No organisms seen.  Pain is severe with movement.  10:29 PM Patient discussed with Dr. Rolena Infante from orthopedics who will admit the patient.  Plan for patient to go to the OR.  Will keep NPO.  Montine Circle, PA-C 06/19/15 2348  Debby Freiberg, MD 06/21/15 [phone]

## 2015-06-19 NOTE — ED Notes (Signed)
Per patient/Dr Faythe Ghee here for admission for left knee cellulitis-knee aspirated in office-sent here for blood work-

## 2015-06-20 ENCOUNTER — Emergency Department (HOSPITAL_COMMUNITY): Payer: Medicare Other | Admitting: Anesthesiology

## 2015-06-20 ENCOUNTER — Encounter (HOSPITAL_COMMUNITY)
Admission: EM | Disposition: A | Discharge status: Home Health | Payer: Self-pay | Source: Home / Self Care | Attending: Specialist

## 2015-06-20 DIAGNOSIS — Z8673 Personal history of transient ischemic attack (TIA), and cerebral infarction without residual deficits: Secondary | ICD-10-CM | POA: Diagnosis not present

## 2015-06-20 DIAGNOSIS — K5900 Constipation, unspecified: Secondary | ICD-10-CM | POA: Diagnosis not present

## 2015-06-20 DIAGNOSIS — Z8546 Personal history of malignant neoplasm of prostate: Secondary | ICD-10-CM | POA: Diagnosis not present

## 2015-06-20 DIAGNOSIS — I10 Essential (primary) hypertension: Secondary | ICD-10-CM | POA: Diagnosis present

## 2015-06-20 DIAGNOSIS — Z7902 Long term (current) use of antithrombotics/antiplatelets: Secondary | ICD-10-CM | POA: Diagnosis not present

## 2015-06-20 DIAGNOSIS — Z87891 Personal history of nicotine dependence: Secondary | ICD-10-CM | POA: Diagnosis not present

## 2015-06-20 DIAGNOSIS — L03116 Cellulitis of left lower limb: Secondary | ICD-10-CM | POA: Diagnosis present

## 2015-06-20 DIAGNOSIS — M25572 Pain in left ankle and joints of left foot: Secondary | ICD-10-CM | POA: Diagnosis not present

## 2015-06-20 DIAGNOSIS — Z809 Family history of malignant neoplasm, unspecified: Secondary | ICD-10-CM | POA: Diagnosis not present

## 2015-06-20 DIAGNOSIS — M109 Gout, unspecified: Secondary | ICD-10-CM | POA: Diagnosis present

## 2015-06-20 DIAGNOSIS — Z880 Allergy status to penicillin: Secondary | ICD-10-CM | POA: Diagnosis not present

## 2015-06-20 DIAGNOSIS — M009 Pyogenic arthritis, unspecified: Secondary | ICD-10-CM | POA: Diagnosis present

## 2015-06-20 DIAGNOSIS — Z79899 Other long term (current) drug therapy: Secondary | ICD-10-CM | POA: Diagnosis not present

## 2015-06-20 DIAGNOSIS — Z85828 Personal history of other malignant neoplasm of skin: Secondary | ICD-10-CM | POA: Diagnosis not present

## 2015-06-20 DIAGNOSIS — M25562 Pain in left knee: Secondary | ICD-10-CM | POA: Diagnosis present

## 2015-06-20 DIAGNOSIS — L02416 Cutaneous abscess of left lower limb: Secondary | ICD-10-CM | POA: Diagnosis present

## 2015-06-20 HISTORY — PX: INCISION AND DRAINAGE ABSCESS: SHX5864

## 2015-06-20 LAB — BASIC METABOLIC PANEL
Anion gap: 5 (ref 5–15)
BUN: 16 mg/dL (ref 6–20)
CALCIUM: 9.2 mg/dL (ref 8.9–10.3)
CHLORIDE: 105 mmol/L (ref 101–111)
CO2: 26 mmol/L (ref 22–32)
CREATININE: 0.67 mg/dL (ref 0.61–1.24)
GFR calc non Af Amer: >60 mL/min (ref >60)
GLUCOSE: 111 mg/dL — AB (ref 65–99)
Potassium: 4.3 mmol/L (ref 3.5–5.1)
Sodium: 136 mmol/L (ref 135–145)

## 2015-06-20 LAB — GLUCOSE, SYNOVIAL FLUID: GLUCOSE, SYNOVIAL FLUID: 66 mg/dL

## 2015-06-20 SURGERY — INCISION AND DRAINAGE, ABSCESS
Anesthesia: General | Site: Knee | Laterality: Left

## 2015-06-20 MED ORDER — OXYCODONE HCL 5 MG PO TABS
5.0000 mg | ORAL_TABLET | ORAL | Status: DC | PRN
  Administered 2015-06-20 – 2015-06-26 (×26): 10 mg via ORAL
  Filled 2015-06-20 (×26): qty 2

## 2015-06-20 MED ORDER — FENTANYL CITRATE (PF) 100 MCG/2ML IJ SOLN
INTRAMUSCULAR | Status: AC
  Administered 2015-06-20: 25 ug via INTRAVENOUS
  Filled 2015-06-20: qty 2

## 2015-06-20 MED ORDER — METOCLOPRAMIDE HCL 10 MG PO TABS: 5.0000 mg | ORAL_TABLET | Freq: Three times a day (TID) | ORAL | Status: DC | PRN

## 2015-06-20 MED ORDER — OLMESARTAN MEDOXOMIL-HCTZ 20-12.5 MG PO TABS: 0.5000 | ORAL_TABLET | Freq: Every day | ORAL | Status: DC

## 2015-06-20 MED ORDER — ONDANSETRON HCL 4 MG/2ML IJ SOLN
4.0000 mg | Freq: Four times a day (QID) | INTRAMUSCULAR | Status: DC | PRN
  Administered 2015-06-22: 4 mg via INTRAVENOUS
  Filled 2015-06-20: qty 2

## 2015-06-20 MED ORDER — HYDROCHLOROTHIAZIDE 10 MG/ML ORAL SUSPENSION
6.2500 mg | Freq: Every day | ORAL | Status: DC
  Administered 2015-06-21 – 2015-06-26 (×6): 6.25 mg via ORAL
  Filled 2015-06-20 (×7): qty 1.25

## 2015-06-20 MED ORDER — FENTANYL CITRATE (PF) 100 MCG/2ML IJ SOLN
INTRAMUSCULAR | Status: AC
  Filled 2015-06-20: qty 4

## 2015-06-20 MED ORDER — BUPIVACAINE HCL 0.25 % IJ SOLN
INTRAMUSCULAR | Status: AC
  Filled 2015-06-20: qty 1

## 2015-06-20 MED ORDER — LACTATED RINGERS IV SOLN
INTRAVENOUS | Status: DC | PRN
  Administered 2015-06-19: via INTRAVENOUS

## 2015-06-20 MED ORDER — FENTANYL CITRATE (PF) 100 MCG/2ML IJ SOLN
INTRAMUSCULAR | Status: DC | PRN
  Administered 2015-06-20: 100 ug via INTRAVENOUS
  Administered 2015-06-20: 25 ug via INTRAVENOUS

## 2015-06-20 MED ORDER — METOCLOPRAMIDE HCL 5 MG/ML IJ SOLN: 5.0000 mg | Freq: Three times a day (TID) | INTRAMUSCULAR | Status: DC | PRN

## 2015-06-20 MED ORDER — ACETAMINOPHEN 650 MG RE SUPP: 650.0000 mg | Freq: Four times a day (QID) | RECTAL | Status: DC | PRN

## 2015-06-20 MED ORDER — IRBESARTAN 75 MG PO TABS
75.0000 mg | ORAL_TABLET | Freq: Every day | ORAL | Status: DC
  Administered 2015-06-21 – 2015-06-26 (×6): 75 mg via ORAL
  Filled 2015-06-20 (×7): qty 1

## 2015-06-20 MED ORDER — BUPIVACAINE-EPINEPHRINE (PF) 0.25% -1:200000 IJ SOLN
INTRAMUSCULAR | Status: AC
  Filled 2015-06-20: qty 30

## 2015-06-20 MED ORDER — ONDANSETRON HCL 4 MG/2ML IJ SOLN
INTRAMUSCULAR | Status: DC | PRN
  Administered 2015-06-20: 4 mg via INTRAVENOUS

## 2015-06-20 MED ORDER — METHOCARBAMOL 500 MG PO TABS
500.0000 mg | ORAL_TABLET | Freq: Four times a day (QID) | ORAL | Status: DC | PRN
  Administered 2015-06-20 – 2015-06-25 (×11): 500 mg via ORAL
  Filled 2015-06-20 (×13): qty 1

## 2015-06-20 MED ORDER — SUCCINYLCHOLINE CHLORIDE 20 MG/ML IJ SOLN
INTRAMUSCULAR | Status: DC | PRN
  Administered 2015-06-20: 100 mg via INTRAVENOUS

## 2015-06-20 MED ORDER — ONDANSETRON HCL 4 MG/2ML IJ SOLN
INTRAMUSCULAR | Status: AC
  Filled 2015-06-20: qty 2

## 2015-06-20 MED ORDER — LACTATED RINGERS IV SOLN
INTRAVENOUS | Status: DC
  Administered 2015-06-20: 12:00:00 via INTRAVENOUS
  Administered 2015-06-20: 1000 mL via INTRAVENOUS
  Administered 2015-06-22 – 2015-06-24 (×2): via INTRAVENOUS

## 2015-06-20 MED ORDER — SODIUM CHLORIDE 0.9 % IJ SOLN
10.0000 mL | INTRAMUSCULAR | Status: DC | PRN
  Administered 2015-06-21 – 2015-06-26 (×4): 10 mL
  Filled 2015-06-20 (×4): qty 40

## 2015-06-20 MED ORDER — SODIUM CHLORIDE 0.9 % IR SOLN
Status: DC | PRN
  Administered 2015-06-20: 9000 mL

## 2015-06-20 MED ORDER — PROPOFOL 10 MG/ML IV BOLUS
INTRAVENOUS | Status: DC | PRN
  Administered 2015-06-20: 150 mg via INTRAVENOUS

## 2015-06-20 MED ORDER — DOCUSATE SODIUM 100 MG PO CAPS
100.0000 mg | ORAL_CAPSULE | Freq: Two times a day (BID) | ORAL | Status: DC
  Administered 2015-06-20 – 2015-06-26 (×12): 100 mg via ORAL
  Filled 2015-06-20 (×5): qty 1

## 2015-06-20 MED ORDER — VANCOMYCIN HCL IN DEXTROSE 1-5 GM/200ML-% IV SOLN
1000.0000 mg | Freq: Two times a day (BID) | INTRAVENOUS | Status: DC
  Administered 2015-06-20 – 2015-06-26 (×13): 1000 mg via INTRAVENOUS
  Filled 2015-06-20 (×13): qty 200

## 2015-06-20 MED ORDER — FENTANYL CITRATE (PF) 100 MCG/2ML IJ SOLN
25.0000 ug | INTRAMUSCULAR | Status: DC | PRN
  Administered 2015-06-20 (×4): 25 ug via INTRAVENOUS

## 2015-06-20 MED ORDER — ONDANSETRON HCL 4 MG PO TABS: 4.0000 mg | ORAL_TABLET | Freq: Four times a day (QID) | ORAL | Status: DC | PRN

## 2015-06-20 MED ORDER — ACETAMINOPHEN 325 MG PO TABS
650.0000 mg | ORAL_TABLET | Freq: Four times a day (QID) | ORAL | Status: DC | PRN
  Administered 2015-06-23 – 2015-06-25 (×3): 650 mg via ORAL
  Filled 2015-06-20 (×4): qty 2

## 2015-06-20 MED ORDER — DEXTROSE 5 % IV SOLN
500.0000 mg | Freq: Four times a day (QID) | INTRAVENOUS | Status: DC | PRN
  Administered 2015-06-20: 500 mg via INTRAVENOUS
  Filled 2015-06-20 (×3): qty 5

## 2015-06-20 MED ORDER — MORPHINE SULFATE (PF) 2 MG/ML IV SOLN
2.0000 mg | INTRAVENOUS | Status: DC | PRN
  Administered 2015-06-23 (×2): 1 mg via INTRAVENOUS
  Administered 2015-06-23 – 2015-06-24 (×2): 2 mg via INTRAVENOUS
  Filled 2015-06-20 (×3): qty 1

## 2015-06-20 MED ORDER — LACTATED RINGERS IV SOLN: INTRAVENOUS | Status: DC

## 2015-06-20 SURGICAL SUPPLY — 20 items
BANDAGE ELASTIC 6 VELCRO ST LF (GAUZE/BANDAGES/DRESSINGS) ×2 IMPLANT
BLADE SURG SZ11 CARB STEEL (BLADE) IMPLANT
BNDG GAUZE ELAST 4 BULKY (GAUZE/BANDAGES/DRESSINGS) ×2 IMPLANT
DRSG EMULSION OIL 3X3 NADH (GAUZE/BANDAGES/DRESSINGS) ×2 IMPLANT
DRSG PAD ABDOMINAL 8X10 ST (GAUZE/BANDAGES/DRESSINGS) IMPLANT
EVACUATOR 1/8 PVC DRAIN (DRAIN) ×2 IMPLANT
GLOVE BIO SURGEON STRL SZ7 (GLOVE) ×3 IMPLANT
GLOVE SURG ORTHO 8.5 STRL (GLOVE) ×3 IMPLANT
GOWN STRL REUS W/TWL XL LVL3 (GOWN DISPOSABLE) ×3 IMPLANT
IMMOBILIZER KNEE 20 (SOFTGOODS) ×3
IMMOBILIZER KNEE 20 THIGH 36 (SOFTGOODS) IMPLANT
KIT BASIN OR (CUSTOM PROCEDURE TRAY) IMPLANT
MANIFOLD NEPTUNE II (INSTRUMENTS) ×3 IMPLANT
PACK ARTHROSCOPY WL (CUSTOM PROCEDURE TRAY) ×2 IMPLANT
POSITIONER SURGICAL ARM (MISCELLANEOUS) ×3 IMPLANT
SET ARTHROSCOPY TUBING (MISCELLANEOUS) ×3
SET ARTHROSCOPY TUBING PVC (MISCELLANEOUS) IMPLANT
SUT BONE WAX W31G (SUTURE) ×3 IMPLANT
SUT NYLON 3 0 (SUTURE) ×2 IMPLANT
TOWEL OR 17X26 10 PK STRL BLUE (TOWEL DISPOSABLE) ×6 IMPLANT

## 2015-06-20 NOTE — Care Management Note (Signed)
Case Management Note  Patient Details  Name: Timothy Duran MRN: [id number] Date of Birth: May 11, 1946  Subjective/Objective:                  Septic left knee.  Action/Plan: Discharge planning  Expected Discharge Date:  06/22/15               Expected Discharge Plan:  [place]  In-House Referral:     Discharge planning Services  CM Consult  Post Acute Care Choice:  Home Health Choice offered to:  Patient  DME Arranged:  IV pump/equipment DME Agency:  [place]:  RN [facility] Agency:  [place]  Status of Service:  In process, will continue to follow  Medicare Important Message Given:    Date Medicare IM Given:    Medicare IM give by:    Date Additional Medicare IM Given:    Additional Medicare Important Message give by:     If discussed at [place] of Stay Meetings, dates discussed:    Additional Comments: CM met with pt in room and discussed the PICC line and probable home IV ABX and offered choice of home health agency.  Pt chooses [facility] to provide services IF MD orders home IV ABX.  Referral called to [facility] for [facility] for probable home IV ABX.  [facility] will follow.  CM will follow for needs.  Orders have been requested.   Dellie Catholic, RN 06/20/2015, 1:10 PM

## 2015-06-20 NOTE — Progress Notes (Signed)
Peripherally Inserted Central Catheter/Midline Placement  The IV Nurse has discussed with the patient and/or persons authorized to consent for the patient, the purpose of this procedure and the potential benefits and risks involved with this procedure.  The benefits include less needle sticks, lab draws from the catheter and patient may be discharged home with the catheter.  Risks include, but not limited to, infection, bleeding, blood clot (thrombus formation), and puncture of an artery; nerve damage and irregular heat beat.  Alternatives to this procedure were also discussed.  PICC/Midline Placement Documentation  PICC / Midline Single Lumen 84/16/60 PICC Right Basilic 40 cm 2 cm (Active)  Indication for Insertion or Continuance of Line Home intravenous therapies (PICC only) 06/20/2015  6:00 PM  Exposed Catheter (cm) 2 cm 06/20/2015  6:00 PM  Dressing Change Due 06/27/15 06/20/2015  6:00 PM       Jule Economy Horton 06/20/2015, 6:43 PM

## 2015-06-20 NOTE — Anesthesia Postprocedure Evaluation (Signed)
  Anesthesia Post-op Note  Patient: Timothy Duran  Procedure(s) Performed: Procedure(s) (LRB): INCISION AND DRAINAGE ABSCESS (Left)  Patient Location: PACU  Anesthesia Type: General  Level of Consciousness: awake and alert   Airway and Oxygen Therapy: Patient Spontanous Breathing  Post-op Pain: mild  Post-op Assessment: Post-op Vital signs reviewed, Patient's Cardiovascular Status Stable, Respiratory Function Stable, Patent Airway and No signs of Nausea or vomiting  Last Vitals:  Filed Vitals:   06/20/15 1130  BP: 106/66  Pulse: 72  Temp: 37 C  Resp: 15    Post-op Vital Signs: stable   Complications: No apparent anesthesia complications

## 2015-06-20 NOTE — Anesthesia Procedure Notes (Signed)
Procedure Name: Intubation Date/Time: 06/20/2015 12:19 AM Performed by: Dione Booze Pre-anesthesia Checklist: Patient identified, Emergency Drugs available, Suction available and Patient being monitored Patient Re-evaluated:Patient Re-evaluated prior to inductionOxygen Delivery Method: Circle system utilized Preoxygenation: Pre-oxygenation with 100% oxygen Intubation Type: IV induction, Cricoid Pressure applied and Rapid sequence Laryngoscope Size: Mac and 4 Grade View: Grade I Tube type: Oral Tube size: 7.5 mm Number of attempts: 1 Airway Equipment and Method: Stylet Placement Confirmation: ETT inserted through vocal cords under direct vision and positive ETCO2 Secured at: 21 cm Tube secured with: Tape Dental Injury: Teeth and Oropharynx as per pre-operative assessment

## 2015-06-20 NOTE — Consult Note (Signed)
[facility] for Infectious Disease    Date of Admission:  06/19/2015           Day 1 vancomycin        Reason for Consult:Septic arthritis of left knee    Referring Physician: Dr. Melina Schools    Principal Problem:   Septic arthritis of knee, left   . hydrochlorothiazide  6.25 mg Oral Daily  . irbesartan  75 mg Oral Daily  . vancomycin  1,000 mg Intravenous Q12H    Recommendations: 1. Continue vancomycin pending operative culture results   Assessment: It appears that he has septic arthritis of his left knee. I agree with vancomycin pending culture results. If cultures remain negative I will consider adding a fluoroquinolone for broader gram-negative rod coverage. I will follow up in the morning.    HPI: Timothy Duran is a 69 y.o. male developed sudden onset of left knee pain and swelling about 2 weeks ago. He recently had several episodes of epistaxis requiring cautery and packing of his left nares. He was briefly hospitalized for repacking. He believes he was on some antibiotics around that time. He is not aware of having had any fevers recently. Because of his increasing knee pain he was seen in the emergency department. He underwent arthrocentesis. Synovial fluid white blood cell count could not be obtained because the specimen clotted. There was a predominance of neutrophils and no crystals. Gram stain did not show any organisms and the culture from 06/18/2015 is negative. He underwent arthroscopic washout this morning. Operative Gram stain is pending.  Review of Systems: Constitutional: negative Eyes: negative Ears, nose, mouth, throat, and face: as noted in history of present illness Respiratory: negative Cardiovascular: negative Gastrointestinal: negative Genitourinary:negative  Past Medical History  Diagnosis Date  . Hypertension   . Abdominal hernia     "in there now" (05/20/2015)  . TIA (transient ischemic attack) 2004  . Arthritis     "terrible;  all over" (05/20/2015)  . Prostate cancer   . Basal cell carcinoma     "left neck"  . Epistaxis 05/20/2015    hospitalized    Social History  Substance Use Topics  . Smoking status: Former Smoker -- 1.00 packs/day for 4 years    Types: Cigarettes    Quit date: 08/29/2008  . Smokeless tobacco: Never Used  . Alcohol Use: No    No family history on file. Allergies  Allergen Reactions  . Other     Nasal packing- headaches and facial pain  . Penicillins     Blisters     OBJECTIVE: Blood pressure 120/78, pulse 80, temperature 99.3 F (37.4 C), temperature source Rectal, resp. rate 16, height 5\' 8"  (1.727 m), weight 187 lb (84.823 kg), SpO2 97 %. General: He is resting comfortably in bed.  Skin: Is flushed  Lungs: Clear  Cor: Distant but regular S1 and S2 with no murmur heard  Abdomen: OBs, soft and nontender. He has a midline hernia left leg in an Ace wrap   Lab Results Lab Results  Component Value Date   WBC 14.7* 06/19/2015   HGB 15.2 06/19/2015   HCT 44.8 06/19/2015   MCV 93.3 06/19/2015   PLT 264 06/19/2015    Lab Results  Component Value Date   CREATININE 0.67 06/20/2015   BUN 16 06/20/2015   NA 136 06/20/2015   K 4.3 06/20/2015   CL 105 06/20/2015   CO2 26 06/20/2015  Lab Results  Component Value Date   ALT 17 05/08/2015   AST 16 05/08/2015   ALKPHOS 89 05/08/2015   BILITOT 0.3 05/08/2015     Microbiology: Recent Results (from the past 240 hour(s))  Culture, body fluid-bottle     Status: None (Preliminary result)   Collection Time: 06/18/15 12:18 PM  Result Value Ref Range Status   Specimen Description FLUID SYNOVIAL LEFT KNEE  Final   Special Requests NONE  Final   Culture   Final    NO GROWTH 2 DAYS Performed at [facility]    Report Status PENDING  Incomplete  Gram stain     Status: None   Collection Time: 06/18/15 12:19 PM  Result Value Ref Range Status   Specimen Description FLUID SYNOVIAL LEFT KNEE  Final   Special Requests  NONE  Final   Gram Stain   Final    WBC PRESENT,BOTH PMN AND MONONUCLEAR NO ORGANISMS SEEN CYTOSPIN Performed at [facility]    Report Status 06/18/2015 FINAL  Final  Body fluid culture     Status: None (Preliminary result)   Collection Time: 06/19/15  7:48 PM  Result Value Ref Range Status   Specimen Description KNEE LEFT  Final   Special Requests NONE  Final   Gram Stain   Final    WBC PRESENT, PREDOMINANTLY PMN NO ORGANISMS SEEN CYTOSPIN Gram Stain Report Called to,Read Back By and Verified With: Cathe Mons [id number] @ 2149 BY J SCOTTON    Culture PENDING  Incomplete   Report Status PENDING  Incomplete  Blood culture (routine x 2)     Status: None (Preliminary result)   Collection Time: 06/19/15  8:06 PM  Result Value Ref Range Status   Specimen Description BLOOD RIGHT FOREARM  Final   Special Requests BOTTLES DRAWN AEROBIC AND ANAEROBIC 5ML  Final   Culture   Final    NO GROWTH < 24 HOURS Performed at [facility]    Report Status PENDING  Incomplete  Blood culture (routine x 2)     Status: None (Preliminary result)   Collection Time: 06/19/15  8:15 PM  Result Value Ref Range Status   Specimen Description BLOOD LEFT ANTECUBITAL  Final   Special Requests BOTTLES DRAWN AEROBIC AND ANAEROBIC 5CC  Final   Culture   Final    NO GROWTH < 24 HOURS Performed at [facility]    Report Status PENDING  Incomplete    Michel Bickers, MD [place] [facility] [phone] pager   [phone] cell 06/20/2015, 5:00 PM

## 2015-06-20 NOTE — Op Note (Signed)
NAMEHARMON, BOMMARITO                  ACCOUNT NO.:  [id number]  MEDICAL RECORD NO.:  [id number]  LOCATION:  7282                         FACILITY:  [facility]  PHYSICIAN:  Unknown Flannigan D. Rolena Infante, M.D. DATE OF BIRTH:  Mar 01, 1946  DATE OF PROCEDURE:  06/20/2015 DATE OF DISCHARGE:                              OPERATIVE REPORT   PREOPERATIVE DIAGNOSIS:  Septic left knee.  POSTOPERATIVE DIAGNOSIS:  Septic left knee.  OPERATIVE PROCEDURE:  Arthroscopic irrigation and debridement of left knee.  COMPLICATIONS:  None.  CONDITION:  Stable.  HISTORY:  This is a very pleasant 69 year old woman, who has been having progressive debilitating left knee pain, significant swelling.  The patient was seen in the office stable with a  partner and told to go to the emergency room.  The patient was tachycardic with fever.  As a result, we elected to admit him to the hospital for arthroscopic I and D.  All appropriate risks, benefits, and alternatives were discussed with the patient.  Consent was obtained.  OPERATIVE NOTE:  The patient was brought to the operating room, placed supine  on the operating table.  After successful induction of general anesthesia and endotracheal intubation, the left knee was prepped and draped in a standard fashion.  Time-out was taken to confirm patient, procedure, and all other pertinent important data.  Once this was completed, left lateral  superior portal was made.  An 11 blade was used to incise the skin.  I inserted the trocar.  There was a frank gross pus which was cultured.  I then made a lateral inferior portal for outflow. Once this was done, I connected it to the arthroscopy and irrigated a total of 9 L of fluid.  At the end of the procedure, the fluid was coming out clear and there was no further purulent material coming out from the washout.  A drain was placed through the lateral superior portal and sutured in place.  A bulky dry dressing and an Ace wrap were applied  as was a knee immobilizer.  The patient was ultimately extubated and transferred to PACU without incident.  At the end the case, all needle and sponge counts were correct.  There were no adverse intraoperative events.     Michel Hendon D. Rolena Infante, M.D.     DDB/MEDQ  D:  06/20/2015  T:  06/20/2015  Job:  [id number]

## 2015-06-20 NOTE — Transfer of Care (Signed)
Immediate Anesthesia Transfer of Care Note  Patient: Timothy Duran  Procedure(s) Performed: Procedure(s): INCISION AND DRAINAGE ABSCESS (Left)  Patient Location: PACU  Anesthesia Type:General  Level of Consciousness: awake, alert , oriented and patient cooperative  Airway & Oxygen Therapy: Patient Spontanous Breathing and Patient connected to face mask oxygen  Post-op Assessment: Report given to RN and Post -op Vital signs reviewed and stable  Post vital signs: Reviewed and stable  Last Vitals:  Filed Vitals:   06/19/15 2255  BP: 120/70  Pulse: 88  Temp:   Resp: 17    Complications: No apparent anesthesia complications

## 2015-06-20 NOTE — Brief Op Note (Signed)
06/19/2015 - 06/20/2015  12:52 AM  PATIENT:  Timothy Duran  69 y.o. male  PRE-OPERATIVE DIAGNOSIS:  Left Knee Infection  POST-OPERATIVE DIAGNOSIS:  left knee infection   PROCEDURE:  Procedure(s): INCISION AND DRAINAGE ABSCESS (Left)  SURGEON:  Surgeon(s) and Role:    * Melina Schools, MD - Primary  PHYSICIAN ASSISTANT:   ASSISTANTS: none   ANESTHESIA:   general  EBL:  Total I/O In: 800 [I.V.:800] Out: -   BLOOD ADMINISTERED:none  DRAINS: left knee drain - hemovac  LOCAL MEDICATIONS USED:  MARCAINE     SPECIMEN:  Aspirate  DISPOSITION OF SPECIMEN:  micro  COUNTS:  YES  TOURNIQUET:  * No tourniquets in log *  DICTATION: .Other Dictation: Dictation Number [id number]  PLAN OF CARE: Admit to inpatient   PATIENT DISPOSITION:  PACU - hemodynamically stable.

## 2015-06-21 ENCOUNTER — Encounter (HOSPITAL_COMMUNITY): Payer: Self-pay | Admitting: Orthopedic Surgery

## 2015-06-21 DIAGNOSIS — M25572 Pain in left ankle and joints of left foot: Secondary | ICD-10-CM

## 2015-06-21 LAB — BASIC METABOLIC PANEL
Anion gap: 5 (ref 5–15)
BUN: 16 mg/dL (ref 6–20)
CALCIUM: 9.4 mg/dL (ref 8.9–10.3)
CHLORIDE: 99 mmol/L — AB (ref 101–111)
CO2: 30 mmol/L (ref 22–32)
CREATININE: 0.72 mg/dL (ref 0.61–1.24)
Glucose, Bld: 111 mg/dL — ABNORMAL HIGH (ref 65–99)
Potassium: 4.8 mmol/L (ref 3.5–5.1)
SODIUM: 134 mmol/L — AB (ref 135–145)

## 2015-06-21 MED ORDER — LEVOFLOXACIN 500 MG PO TABS
500.0000 mg | ORAL_TABLET | Freq: Every day | ORAL | Status: DC
  Administered 2015-06-21 – 2015-06-26 (×6): 500 mg via ORAL
  Filled 2015-06-21 (×6): qty 1

## 2015-06-21 MED ORDER — ENOXAPARIN SODIUM 40 MG/0.4ML ~~LOC~~ SOLN
40.0000 mg | SUBCUTANEOUS | Status: DC
  Administered 2015-06-21 – 2015-06-25 (×5): 40 mg via SUBCUTANEOUS
  Filled 2015-06-21 (×6): qty 0.4

## 2015-06-21 MED ORDER — DOCUSATE SODIUM 100 MG PO CAPS: 100.0000 mg | ORAL_CAPSULE | Freq: Two times a day (BID) | ORAL | Status: DC

## 2015-06-21 MED ORDER — ENOXAPARIN SODIUM 40 MG/0.4ML ~~LOC~~ SOLN
40.0000 mg | Freq: Once | SUBCUTANEOUS | Status: DC
  Filled 2015-06-21: qty 0.4

## 2015-06-21 MED ORDER — OXYCODONE HCL 5 MG PO TABS: 5.0000 mg | ORAL_TABLET | ORAL | Status: DC | PRN

## 2015-06-21 NOTE — Care Management Note (Signed)
Case Management Note  Patient Details  Name: Timothy Duran MRN: [id number] Date of Birth: 25-Jul-1946  Subjective/Objective:Per Nsg awaiting cx.For HHRN-Long term iv abx. Await script.[facility] iv therapy liason Carolynn Sayers already aware & following-HHRN order already placed.                    Action/Plan:d/c plan home w/HHC.   Expected Discharge Date:                Expected Discharge Plan:  [place]  In-House Referral:     Discharge planning Services  CM Consult  Post Acute Care Choice:  Home Health Choice offered to:  Patient  DME Arranged:  IV pump/equipment DME Agency:  [place]:  RN [facility] Agency:  [place]  Status of Service:  In process, will continue to follow  Medicare Important Message Given:    Date Medicare IM Given:    Medicare IM give by:    Date Additional Medicare IM Given:    Additional Medicare Important Message give by:     If discussed at [place] of Stay Meetings, dates discussed:    Additional Comments:  Dessa Phi, RN 06/21/2015, 9:41 AM

## 2015-06-21 NOTE — Progress Notes (Signed)
[facility]  Patient Status: New pt this admission for [facility]  [facility] is providing the following services: HHRN, [place] services for home IV Vancomycin/IV ABX.  Teaching performed with wife this a.m. In room.  Wife did excellent with mock set up and administration and will be independent at home with [facility] continued teaching.  Provided Vancomycin pt education sheet and reviewed home Vancomycin POC.    [facility] will be prepared for weekend DC but will need script for final IV ABX orders.  If patient discharges after hours, please call [phone].   Larry Sierras 06/21/2015, 10:02 AM

## 2015-06-21 NOTE — Progress Notes (Signed)
Patient ID: Timothy Duran, male   DOB: 11/30/1945, 69 y.o.   MRN: [id number]         [facility]    Date of Admission:  06/19/2015           Day 2 vancomycin  Principal Problem:   Septic arthritis of knee, left   . docusate sodium  100 mg Oral BID  . hydrochlorothiazide  6.25 mg Oral Daily  . irbesartan  75 mg Oral Daily  . vancomycin  1,000 mg Intravenous Q12H    SUBJECTIVE: He developed severe left ankle pain overnight. He has not been out of bed. He has no history of gout. His left knee is sore.  Review of Systems: Pertinent items are noted in HPI.  Past Medical History  Diagnosis Date  . Hypertension   . Abdominal hernia     "in there now" (05/20/2015)  . TIA (transient ischemic attack) 2004  . Arthritis     "terrible; all over" (05/20/2015)  . Prostate cancer   . Basal cell carcinoma     "left neck"  . Epistaxis 05/20/2015    hospitalized    Social History  Substance Use Topics  . Smoking status: Former Smoker -- 1.00 packs/day for 4 years    Types: Cigarettes    Quit date: 08/29/2008  . Smokeless tobacco: Never Used  . Alcohol Use: No    No family history on file. Allergies  Allergen Reactions  . Other     Nasal packing- headaches and facial pain  . Penicillins     Blisters     OBJECTIVE: Filed Vitals:   06/20/15 1130 06/20/15 1400 06/20/15 2007 06/21/15 0420  BP: 106/66 120/78 142/61 141/86  Pulse: 72 80 89 90  Temp: 98.6 F (37 C) 99.3 F (37.4 C) 99.5 F (37.5 C) 98.4 F (36.9 C)  TempSrc:   Oral Oral  Resp: 15 16 16 18   Height:      Weight:      SpO2: 97% 97% 97% 98%   Body mass index is 28.44 kg/(m^2).  General: He is a little bit drowsy Skin: New right arm PICC Lungs: Clear Cor: Regular S1 and S2 with no murmurs Abdomen: Obese, soft and nontender Ace wrap on left knee. Significant pain with range of motion of left ankle. There is no redness or swelling apparent in the ankle but he does have 1 red area over  the dorsum of his foot that is nontender  Lab Results Lab Results  Component Value Date   WBC 14.7* 06/19/2015   HGB 15.2 06/19/2015   HCT 44.8 06/19/2015   MCV 93.3 06/19/2015   PLT 264 06/19/2015    Lab Results  Component Value Date   CREATININE 0.72 06/21/2015   BUN 16 06/21/2015   NA 134* 06/21/2015   K 4.8 06/21/2015   CL 99* 06/21/2015   CO2 30 06/21/2015    Lab Results  Component Value Date   ALT 17 05/08/2015   AST 16 05/08/2015   ALKPHOS 89 05/08/2015   BILITOT 0.3 05/08/2015     Microbiology: Recent Results (from the past 240 hour(s))  Culture, body fluid-bottle     Status: None (Preliminary result)   Collection Time: 06/18/15 12:18 PM  Result Value Ref Range Status   Specimen Description FLUID SYNOVIAL LEFT KNEE  Final   Special Requests NONE  Final   Culture   Final    NO GROWTH 2 DAYS Performed at [facility]    Report Status PENDING  Incomplete  Gram stain     Status: None   Collection Time: 06/18/15 12:19 PM  Result Value Ref Range Status   Specimen Description FLUID SYNOVIAL LEFT KNEE  Final   Special Requests NONE  Final   Gram Stain   Final    WBC PRESENT,BOTH PMN AND MONONUCLEAR NO ORGANISMS SEEN CYTOSPIN Performed at [facility]    Report Status 06/18/2015 FINAL  Final  Body fluid culture     Status: None (Preliminary result)   Collection Time: 06/19/15  7:48 PM  Result Value Ref Range Status   Specimen Description KNEE LEFT  Final   Special Requests NONE  Final   Gram Stain   Final    WBC PRESENT, PREDOMINANTLY PMN NO ORGANISMS SEEN CYTOSPIN Gram Stain Report Called to,Read Back By and Verified With: Cathe Mons [phone] @ 2149 BY J SCOTTON    Culture PENDING  Incomplete   Report Status PENDING  Incomplete  Blood culture (routine x 2)     Status: None (Preliminary result)   Collection Time: 06/19/15  8:06 PM  Result Value Ref Range Status   Specimen Description BLOOD RIGHT FOREARM  Final   Special Requests  BOTTLES DRAWN AEROBIC AND ANAEROBIC 5ML  Final   Culture   Final    NO GROWTH < 24 HOURS Performed at [facility]    Report Status PENDING  Incomplete  Blood culture (routine x 2)     Status: None (Preliminary result)   Collection Time: 06/19/15  8:15 PM  Result Value Ref Range Status   Specimen Description BLOOD LEFT ANTECUBITAL  Final   Special Requests BOTTLES DRAWN AEROBIC AND ANAEROBIC 5CC  Final   Culture   Final    NO GROWTH < 24 HOURS Performed at [facility]    Report Status PENDING  Incomplete  Anaerobic culture     Status: None (Preliminary result)   Collection Time: 06/20/15 12:33 AM  Result Value Ref Range Status   Specimen Description SYNOVIAL LEFT KNEE  Final   Special Requests PATIENT ON FOLLOWING VANCOMYCIN  Final   Gram Stain   Final    ABUNDANT WBC PRESENT,BOTH PMN AND MONONUCLEAR NO ORGANISMS SEEN    Culture   Final    NO ANAEROBES ISOLATED; CULTURE IN PROGRESS FOR 5 DAYS Performed at [facility]    Report Status PENDING  Incomplete  Body fluid culture     Status: None (Preliminary result)   Collection Time: 06/20/15 12:33 AM  Result Value Ref Range Status   Specimen Description SYNOVIAL LEFT KNEE  Final   Special Requests PATIENT ON FOLLOWING VANCOMYCIN  Final   Gram Stain   Final    ABUNDANT WBC PRESENT,BOTH PMN AND MONONUCLEAR NO ORGANISMS SEEN Performed at [facility]    Culture PENDING  Incomplete   Report Status PENDING  Incomplete     ASSESSMENT: All synovial fluid cultures are negative but I would still continue treatment for presumed bacterial septic arthritis. I will plan on at least 3 weeks of IV vancomycin and add oral levofloxacin for gram-negative rod coverage. I will arrange to see him back in my clinic within 3 weeks. I'm not sure what is causing his left ankle pain. It is possible he has an acute gout attack.  PLAN: 1. Continue vancomycin and add oral levofloxacin and treat for 3 weeks through  07/11/2015 2. He will follow-up with me in clinic  on 07/11/2015 3. Please call if we can be of further assistance while he is here  Michel Bickers, MD [facility] for [place] [phone] pager   [phone] cell 06/21/2015, 11:35 AM

## 2015-06-21 NOTE — Progress Notes (Signed)
Subjective: 1 Day Post-Op Procedure(s) (LRB): INCISION AND DRAINAGE ABSCESS (Left) Patient reports pain as 5 on 0-10 scale.    Objective: Vital signs in last 24 hours: Temp:  [98.4 F (36.9 C)-99.5 F (37.5 C)] 98.4 F (36.9 C) (09/02 0420) Pulse Rate:  [80-90] 90 (09/02 0420) Resp:  [16-18] 18 (09/02 0420) BP: (120-142)/(61-86) 141/86 mmHg (09/02 0420) SpO2:  [97 %-98 %] 98 % (09/02 0420)  Intake/Output from previous day: 09/01 0701 - 09/02 0700 In: 610 [P.O.:600; I.V.:10] Out: 1575 [Urine:1575] Intake/Output this shift: Total I/O In: 240 [P.O.:240] Out: 0    Recent Labs  06/19/15 1901  HGB 15.2    Recent Labs  06/19/15 1901  WBC 14.7*  RBC 4.80  HCT 44.8  PLT 264    Recent Labs  06/20/15 0520 06/21/15 0620  NA 136 134*  K 4.3 4.8  CL 105 99*  CO2 26 30  BUN 16 16  CREATININE 0.67 0.72  GLUCOSE 111* 111*  CALCIUM 9.2 9.4   No results for input(s): LABPT, INR in the last 72 hours.  Neurologically intact Intact pulses distally No cellulitis present Compartment soft HV d/c'd. Tip intact. Assessment/Plan: 1 Day Post-Op Procedure(s) (LRB): INCISION AND DRAINAGE ABSCESS (Left) Advance diet Up with therapy  lovenox  Kadeisha Betsch C 06/21/2015, 1:35 PM

## 2015-06-21 NOTE — Progress Notes (Addendum)
Subjective: 1 Day Post-Op Procedure(s) (LRB): INCISION AND DRAINAGE ABSCESS (Left) Patient reports pain as moderate.  Reports pain at knee improving, still painful with ROM. C/o medial ankle pain this AM. Denies calf pain. No other c/o. Drain still in place.  Objective: Vital signs in last 24 hours: Temp:  [98.4 F (36.9 C)-99.5 F (37.5 C)] 98.4 F (36.9 C) (09/02 0420) Pulse Rate:  [72-90] 90 (09/02 0420) Resp:  [14-18] 18 (09/02 0420) BP: (106-142)/(58-86) 141/86 mmHg (09/02 0420) SpO2:  [97 %-98 %] 98 % (09/02 0420)  Intake/Output from previous day: 09/01 0701 - 09/02 0700 In: 610 [P.O.:600; I.V.:10] Out: 1575 [Urine:1575] Intake/Output this shift:     Recent Labs  06/18/15 0957 06/19/15 1901  HGB 14.6 15.2    Recent Labs  06/18/15 0957 06/19/15 1901  WBC 10.9* 14.7*  RBC 4.69 4.80  HCT 43.9 44.8  PLT 257 264    Recent Labs  06/20/15 0520 06/21/15 0620  NA 136 134*  K 4.3 4.8  CL 105 99*  CO2 26 30  BUN 16 16  CREATININE 0.67 0.72  GLUCOSE 111* 111*  CALCIUM 9.2 9.4   No results for input(s): LABPT, INR in the last 72 hours.  Neurologically intact ABD soft Neurovascular intact Sensation intact distally Intact pulses distally Dorsiflexion/Plantar flexion intact Incision: dressing C/D/I and no drainage No cellulitis present Compartment soft no sign of DVT  ACE tight at foot/ankle  Assessment/Plan: 1 Day Post-Op Procedure(s) (LRB): INCISION AND DRAINAGE ABSCESS (Left) Advance diet Up with therapy  Plan to remove drain later today Removed ACE from foot and ankle, ok to rewrap loosely later today to control swelling If ankle pain not improving will obtain xrays Cultures still pending- continue vanco per ID. Appreciate input. PICC has been placed for home IV abx  Will discuss with Dr. Tonita Cong Add Lovenox for DVT ppx while in hospital and plan for Plavix at home for DVT ppx  Timothy Duran M. 06/21/2015, 8:25 AM

## 2015-06-21 NOTE — Evaluation (Signed)
Physical Therapy Evaluation Patient Details Name: Timothy Duran MRN: [id number] DOB: 10/18/1946 Today's Date: 06/21/2015   History of Present Illness  L knee septic arthritis s/p I&D 06/20/15  Clinical Impression   Pt admitted as above and presenting with decreased L LE strength/ROM, post op pain, unwillingness/inability to tolerate any WB on L LE, and balance deficits with ambulation limiting functional mobility.  Pt hopes to dc home with assist of spouse and would benefit from HHPT follow up.  This date pt requiring moderate physical assist of 2 for safe completion of limited mobility tasks.  Pt may have to consider follow up rehab at SNF level dependent on acute stay progress.   Follow Up Recommendations Home health PT    Equipment Recommendations  None recommended by PT    Recommendations for Other Services OT consult     Precautions / Restrictions Precautions Precautions: Fall Required Braces or Orthoses: Knee Immobilizer - Left Knee Immobilizer - Left: On when out of bed or walking Restrictions Weight Bearing Restrictions: No Other Position/Activity Restrictions: WBAT      Mobility  Bed Mobility Overal bed mobility: Needs Assistance;+2 for physical assistance;+ 2 for safety/equipment Bed Mobility: Supine to Sit;Sit to Supine     Supine to sit: Mod assist;+2 for physical assistance;+2 for safety/equipment Sit to supine: Mod assist;+2 for physical assistance;+2 for safety/equipment   General bed mobility comments: Increased time, cues for sequence and to self assist with R LE, physical assist to manage L LE, to bring trunk to upright and to move to EOB sitting  Transfers Overall transfer level: Needs assistance Equipment used: Rolling walker (2 wheeled) Transfers: Sit to/from Stand Sit to Stand: Mod assist;+2 physical assistance;+2 safety/equipment         General transfer comment: cues for LE management and use of UEs to self assist  Physical assist to bring wt up and  over LEs and to balance in standing  Ambulation/Gait Ambulation/Gait assistance: Mod assist;+2 physical assistance;+2 safety/equipment Ambulation Distance (Feet): 3 Feet ( - to/from [facility]) Assistive device: Rolling walker (2 wheeled) Gait Pattern/deviations: Step-to pattern;Decreased step length - right;Decreased step length - left;Shuffle;Trunk flexed Gait velocity: decr   General Gait Details: cues for sequence, posture and positon from RW.  Pt tolerating NO wt on L LE.  Physical assist for balance, support and RW management  Stairs            Wheelchair Mobility    Modified Rankin (Stroke Patients Only)       Balance Overall balance assessment: Needs assistance Sitting-balance support: No upper extremity supported Sitting balance-Leahy Scale: Fair       Standing balance-Leahy Scale: Poor                               Pertinent Vitals/Pain Pain Assessment: 0-10 Pain Score: 6  Pain Location: L knee Pain Descriptors / Indicators: Aching;Burning;Sore;Pounding;Throbbing Pain Intervention(s): Limited activity within patient's tolerance;Monitored during session;Premedicated before session;Patient requesting pain meds-RN notified;Ice applied    Home Living Family/patient expects to be discharged to:: Private residence Living Arrangements: Spouse/significant other Available Help at Discharge: Family Type of Home: House Home Access: Stairs to enter   Technical brewer of Steps: 1 Home Layout: One level Home Equipment: Walker - standard Additional Comments: Spouse states can borrow SW    Prior Function Level of Independence: Independent               Hand Dominance  Extremity/Trunk Assessment   Upper Extremity Assessment: Overall WFL for tasks assessed           Lower Extremity Assessment: LLE deficits/detail         Communication   Communication: No difficulties  Cognition Arousal/Alertness: Awake/alert Behavior During  Therapy: WFL for tasks assessed/performed Overall Cognitive Status: Within Functional Limits for tasks assessed                      General Comments      Exercises General Exercises - Lower Extremity Ankle Circles/Pumps: AROM;10 reps;Supine;Both      Assessment/Plan    PT Assessment Patient needs continued PT services  PT Diagnosis Difficulty walking   PT Problem List Decreased strength;Decreased range of motion;Decreased activity tolerance;Decreased balance;Decreased mobility;Decreased knowledge of use of DME;Decreased safety awareness;Obesity;Pain  PT Treatment Interventions DME instruction;Gait training;Stair training;Functional mobility training;Therapeutic activities;Therapeutic exercise;Patient/family education   PT Goals (Current goals can be found in the Care Plan section) Acute Rehab PT Goals Patient Stated Goal: HOME PT Goal Formulation: With patient Time For Goal Achievement: 06/28/15 Potential to Achieve Goals: Fair    Frequency 7X/week   Barriers to discharge Decreased caregiver support Pt is home with spouse only and currently requires significant assist of 2 to safely perform even limited mobility tasks.    Co-evaluation               End of Session Equipment Utilized During Treatment: Gait belt;Left knee immobilizer Activity Tolerance: Patient limited by pain Patient left: in bed;with call bell/phone within reach;with family/visitor present Nurse Communication: Mobility status         Time: 3220-2542 PT Time Calculation (min) (ACUTE ONLY): 43 min   Charges:   PT Evaluation $Initial PT Evaluation Tier I: 1 Procedure PT Treatments $Gait Training: 8-22 mins $Therapeutic Activity: 8-22 mins   PT G Codes:        Debera Sterba 07-17-15, 5:27 PM

## 2015-06-22 LAB — BASIC METABOLIC PANEL
ANION GAP: 6 (ref 5–15)
BUN: 17 mg/dL (ref 6–20)
CHLORIDE: 95 mmol/L — AB (ref 101–111)
CO2: 27 mmol/L (ref 22–32)
Calcium: 9.2 mg/dL (ref 8.9–10.3)
Creatinine, Ser: 0.94 mg/dL (ref 0.61–1.24)
GFR calc Af Amer: >60 mL/min (ref >60)
Glucose, Bld: 123 mg/dL — ABNORMAL HIGH (ref 65–99)
POTASSIUM: 4.1 mmol/L (ref 3.5–5.1)
SODIUM: 128 mmol/L — AB (ref 135–145)

## 2015-06-22 LAB — CBC
HEMATOCRIT: 38.5 % — AB (ref 39.0–52.0)
HEMOGLOBIN: 12.8 g/dL — AB (ref 13.0–17.0)
MCH: 31.1 pg (ref 26.0–34.0)
MCHC: 33.2 g/dL (ref 30.0–36.0)
MCV: 93.7 fL (ref 78.0–100.0)
Platelets: 246 10*3/uL (ref 150–400)
RBC: 4.11 MIL/uL — AB (ref 4.22–5.81)
RDW: 13.1 % (ref 11.5–15.5)
WBC: 13.7 10*3/uL — AB (ref 4.0–10.5)

## 2015-06-22 NOTE — Evaluation (Signed)
Occupational Therapy Evaluation Patient Details Name: BYRNE CAPEK MRN: [id number] DOB: 23-Jan-1946 Today's Date: 06/22/2015    History of Present Illness L knee septic arthritis s/p I&D 06/20/15   Clinical Impression   Pt is s/p knee I and D resulting in the deficits listed below (see OT Problem List).  Pt will benefit from skilled OT to increase their safety and independence with ADL and functional mobility for ADL to facilitate discharge to venue listed below.      Follow Up Recommendations  Home health OT;SNF;Supervision/Assistance - 24 hour    Equipment Recommendations  3 in 1 bedside comode    Recommendations for Other Services       Precautions / Restrictions Precautions Precautions: Fall Required Braces or Orthoses: Knee Immobilizer - Left Knee Immobilizer - Left: On when out of bed or walking Restrictions Weight Bearing Restrictions: No Other Position/Activity Restrictions: WBAT      Mobility Bed Mobility Overal bed mobility: Needs Assistance;+2 for physical assistance;+ 2 for safety/equipment Bed Mobility: Supine to Sit;Sit to Supine     Supine to sit: Mod assist;+2 for physical assistance;+2 for safety/equipment     General bed mobility comments: Increased time, cues for sequence and to self assist with R LE, physical assist to manage L LE, to bring trunk to upright and to move to EOB sitting  Transfers Overall transfer level: Needs assistance Equipment used: Rolling walker (2 wheeled) Transfers: Sit to/from [facility] Sit to Stand: Mod assist;+2 physical assistance;+2 safety/equipment         General transfer comment: cues for LE management and use of UEs to self assist  Physical assist to bring wt up and over LEs and to balance in standing         ADL Overall ADL's : Needs assistance/impaired                         Toilet Transfer: RW;+2 for safety/equipment;+2 for physical assistance;Cueing for sequencing;Cueing for  safety;Stand-pivot;Maximal assistance Toilet Transfer Details (indicate cue type and reason): sit to stand and bed to chair in prep for toileting Toileting- Clothing Manipulation and Hygiene: Sit to/from stand;Cueing for sequencing;Maximal assistance;Cueing for safety;+2 for physical assistance;+2 for safety/equipment         General ADL Comments: spoke with pt and wife about rehab versus home               Pertinent Vitals/Pain Pain Score: 6  Pain Location: r ankle and L knee Pain Descriptors / Indicators: Sore Pain Intervention(s): Monitored during session;Limited activity within patient's tolerance;Premedicated before session;Repositioned;Ice applied     Hand Dominance     Extremity/Trunk Assessment Upper Extremity Assessment Upper Extremity Assessment: Overall WFL for tasks assessed           Communication Communication Communication: No difficulties   Cognition Arousal/Alertness: Awake/alert Behavior During Therapy: WFL for tasks assessed/performed Overall Cognitive Status: Within Functional Limits for tasks assessed                                Home Living Family/patient expects to be discharged to:: Private residence Living Arrangements: Spouse/significant other Available Help at Discharge: Family Type of Home: House Home Access: Stairs to enter Technical brewer of Steps: 1   Home Layout: One level               Home Equipment: Walker - standard   Additional Comments: Spouse  states can borrow SW      Prior Functioning/Environment Level of Independence: Independent             OT Diagnosis: Generalized weakness   OT Problem List: Decreased strength;Decreased activity tolerance   OT Treatment/Interventions: Self-care/ADL training;DME and/or AE instruction;Patient/family education    OT Goals(Current goals can be found in the care plan section) Acute Rehab OT Goals Patient Stated Goal: HOME OT Goal Formulation: With  patient Time For Goal Achievement: 07/06/15  OT Frequency: Min 2X/week   Barriers to D/C:               End of Session Nurse Communication: Mobility status  Activity Tolerance: Patient tolerated treatment well Patient left:  in bed with call bell   Time: 0802-2336 OT Time Calculation (min): 28 min Charges:  OT General Charges $OT Visit: 1 Procedure OT Evaluation $Initial OT Evaluation Tier I: 1 Procedure G-Codes:    Payton Mccallum D 07/16/2015, 2:56 PM

## 2015-06-22 NOTE — Progress Notes (Signed)
Subjective: 2 Days Post-Op Procedure(s) (LRB): INCISION AND DRAINAGE ABSCESS (Left) Patient reports pain as mild to left knee and is moderate to left ankle.  Tolerating Po's. Progressing with PT. Denies F/C. No SOB or CP. Wife at bedside and is concerned about D/c when pt has not been up OOB.  Objective: Vital signs in last 24 hours: Temp:  [98.7 F (37.1 C)-99.5 F (37.5 C)] 99.3 F (37.4 C) (09/03 0526) Pulse Rate:  [78-96] 78 (09/03 0526) Resp:  [16-18] 16 (09/03 0526) BP: (116-132)/(63-69) 132/69 mmHg (09/03 0526) SpO2:  [91 %-95 %] 95 % (09/03 0526)  Intake/Output from previous day: 09/02 0701 - 09/03 0700 In: 660 [P.O.:660] Out: 0  Intake/Output this shift:     Recent Labs  06/19/15 1901 06/22/15 0615  HGB 15.2 12.8*    Recent Labs  06/19/15 1901 06/22/15 0615  WBC 14.7* 13.7*  RBC 4.80 4.11*  HCT 44.8 38.5*  PLT 264 246    Recent Labs  06/21/15 0620 06/22/15 0615  NA 134* 128*  K 4.8 4.1  CL 99* 95*  CO2 30 27  BUN 16 17  CREATININE 0.72 0.94  GLUCOSE 111* 123*  CALCIUM 9.4 9.2   No results for input(s): LABPT, INR in the last 72 hours.  Alert and oriented x3. RRR, Lungs clear, BS x4. Left Calf soft and non tender. L knee dressing C/D/I. No DVT signs. No signs of infection or compartment syndrome. LLE grossly neurovascularly intact. Left ankle pain with ROm and palpation. Edema to left ankle. No abrasions. LLE grossly N/V intact.    Assessment/Plan: 2 Days Post-Op Procedure(s) (LRB): INCISION AND DRAINAGE ABSCESS (Left) Up with PT May D/c when ready. Family is not ready today ID has made recommendations and pt will f/u with Dr. Megan Salon in the office Cultures pending  Acute left ankle pain: Atraumatic and no gout hx. Does take HCTZ diuretic. Possibly gout attack Continue to monitor, if worsening possible aspiration.  Cesia Orf L 06/22/2015, 8:24 AM

## 2015-06-22 NOTE — Progress Notes (Signed)
Physical Therapy Treatment Patient Details Name: Timothy Duran MRN: [id number] DOB: 1946/02/28 Today's Date: 06/22/2015    History of Present Illness L knee septic arthritis s/p I&D 06/20/15    PT Comments    Pt progressing very slowly 2* L knee and R ankle pain.  Discussed with pt possible dc to rehab vs home.  Pt fairly adamant to go home.  Will review same with him at future session.  Follow Up Recommendations  Home health PT     Equipment Recommendations  None recommended by PT    Recommendations for Other Services OT consult     Precautions / Restrictions Precautions Precautions: Fall Required Braces or Orthoses: Knee Immobilizer - Left Knee Immobilizer - Left: On when out of bed or walking Restrictions Weight Bearing Restrictions: No Other Position/Activity Restrictions: WBAT    Mobility  Bed Mobility Overal bed mobility: Needs Assistance;+2 for physical assistance;+ 2 for safety/equipment Bed Mobility: Sit to Supine     Supine to sit: Mod assist;+2 for physical assistance;+2 for safety/equipment Sit to supine: +2 for physical assistance;+2 for safety/equipment;Min assist;Mod assist   General bed mobility comments: Increased time, cues for sequence and to self assist with R LE, physical assist to manage L LE, and trunk  Transfers Overall transfer level: Needs assistance Equipment used: Rolling walker (2 wheeled) Transfers: Sit to/from Stand Sit to Stand: Mod assist;+2 physical assistance;+2 safety/equipment         General transfer comment: cues for LE management and use of UEs to self assist  Physical assist to bring wt up and over LEs and to balance in standing  Ambulation/Gait Ambulation/Gait assistance: Mod assist;+2 physical assistance;+2 safety/equipment Ambulation Distance (Feet): 5 Feet Assistive device: Rolling walker (2 wheeled) Gait Pattern/deviations: Step-to pattern;Decreased step length - right;Decreased step length - left;Shuffle;Trunk  flexed Gait velocity: decr   General Gait Details: cues for sequence, posture and positon from RW.  Physical assist for balance, support and RW management,  Pt tolerating increased wt on L LE but less on R   Stairs            Wheelchair Mobility    Modified Rankin (Stroke Patients Only)       Balance     Sitting balance-Leahy Scale: Fair       Standing balance-Leahy Scale: Poor                      Cognition Arousal/Alertness: Awake/alert Behavior During Therapy: WFL for tasks assessed/performed Overall Cognitive Status: Within Functional Limits for tasks assessed                      Exercises General Exercises - Lower Extremity Ankle Circles/Pumps: AROM;10 reps;Supine;Both (pt reluctant to do and moving minimally 2* pain)    General Comments        Pertinent Vitals/Pain Pain Assessment: 0-10 Pain Score: 6  Pain Location: R ankle and L knee Pain Descriptors / Indicators: Aching;Sore Pain Intervention(s): Limited activity within patient's tolerance;Monitored during session;Premedicated before session;Ice applied    Home Living Family/patient expects to be discharged to:: Private residence Living Arrangements: Spouse/significant other Available Help at Discharge: Family Type of Home: House Home Access: Stairs to enter   Home Layout: One level Home Equipment: Environmental consultant - standard Additional Comments: Spouse states can borrow SW    Prior Function Level of Independence: Independent          PT Goals (current goals can now be found in the care  plan section) Acute Rehab PT Goals Patient Stated Goal: HOME PT Goal Formulation: With patient Time For Goal Achievement: 06/28/15 Potential to Achieve Goals: Fair Progress towards PT goals: Progressing toward goals (Slowly)    Frequency  7X/week    PT Plan Current plan remains appropriate    Co-evaluation PT/OT/SLP Co-Evaluation/Treatment: Yes Reason for Co-Treatment: For  patient/therapist safety PT goals addressed during session: Mobility/safety with mobility OT goals addressed during session: ADL's and self-care     End of Session Equipment Utilized During Treatment: Gait belt;Left knee immobilizer Activity Tolerance: Patient limited by fatigue;Patient limited by pain Patient left: in bed;with call bell/phone within reach;with family/visitor present     Time: 3546-5681 PT Time Calculation (min) (ACUTE ONLY): 35 min  Charges:  $Gait Training: 8-22 mins                    G Codes:      Timothy Duran 27-Jun-2015, 3:20 PM

## 2015-06-22 NOTE — Progress Notes (Signed)
Physical Therapy Treatment Patient Details Name: Timothy Duran MRN: [id number] DOB: 02/22/1946 Today's Date: 06/22/2015    History of Present Illness L knee septic arthritis s/p I&D 06/20/15    PT Comments    Pt continues to struggle with mobility 2* L knee and R ankle pain but progressing slowly and able to tolerate partial wt on L LE this am.  Follow Up Recommendations  Home health PT     Equipment Recommendations  None recommended by PT    Recommendations for Other Services OT consult     Precautions / Restrictions Precautions Precautions: Fall Required Braces or Orthoses: Knee Immobilizer - Left Knee Immobilizer - Left: On when out of bed or walking Restrictions Weight Bearing Restrictions: No Other Position/Activity Restrictions: WBAT    Mobility  Bed Mobility Overal bed mobility: Needs Assistance;+2 for physical assistance;+ 2 for safety/equipment Bed Mobility: Supine to Sit;Sit to Supine     Supine to sit: Mod assist;+2 for physical assistance;+2 for safety/equipment     General bed mobility comments: Increased time, cues for sequence and to self assist with R LE, physical assist to manage L LE, to bring trunk to upright and to move to EOB sitting  Transfers Overall transfer level: Needs assistance Equipment used: Rolling walker (2 wheeled) Transfers: Sit to/from Stand Sit to Stand: Mod assist;+2 physical assistance;+2 safety/equipment         General transfer comment: cues for LE management and use of UEs to self assist  Physical assist to bring wt up and over LEs and to balance in standing  Ambulation/Gait Ambulation/Gait assistance: Mod assist Ambulation Distance (Feet): 6 Feet Assistive device: Rolling walker (2 wheeled) Gait Pattern/deviations: Step-to pattern;Decreased step length - right;Decreased step length - left;Shuffle;Trunk flexed Gait velocity: decr   General Gait Details: cues for sequence, posture and positon from RW.  Physical assist for  balance, support and RW management,  Pt tolerating increased wt on L LE but less on R   Stairs            Wheelchair Mobility    Modified Rankin (Stroke Patients Only)       Balance     Sitting balance-Leahy Scale: Fair       Standing balance-Leahy Scale: Poor                      Cognition Arousal/Alertness: Awake/alert Behavior During Therapy: WFL for tasks assessed/performed Overall Cognitive Status: Within Functional Limits for tasks assessed                      Exercises General Exercises - Lower Extremity Ankle Circles/Pumps: AROM;10 reps;Supine;Both (but limited movement bil 2* c/o discomfort)    General Comments        Pertinent Vitals/Pain Pain Assessment: 0-10 Pain Score: 8  Pain Location: R ankle and with min pain reported at L knee Pain Descriptors / Indicators: Sore Pain Intervention(s): Limited activity within patient's tolerance;Monitored during session;Premedicated before session;Ice applied    Home Living                      Prior Function            PT Goals (current goals can now be found in the care plan section) Acute Rehab PT Goals Patient Stated Goal: HOME PT Goal Formulation: With patient Time For Goal Achievement: 06/28/15 Potential to Achieve Goals: Fair Progress towards PT goals: Progressing toward goals    Frequency  7X/week    PT Plan Current plan remains appropriate    Co-evaluation             End of Session Equipment Utilized During Treatment: Gait belt;Left knee immobilizer Activity Tolerance: Patient limited by fatigue;Patient limited by pain Patient left: in chair;with call bell/phone within reach;with family/visitor present     Time: 2297-9892 PT Time Calculation (min) (ACUTE ONLY): 27 min  Charges:  $Gait Training: 8-22 mins $Therapeutic Activity: 8-22 mins                    G Codes:      Timothy Duran June 28, 2015, 12:27 PM

## 2015-06-23 LAB — CBC
HEMATOCRIT: 36.8 % — AB (ref 39.0–52.0)
Hemoglobin: 12.2 g/dL — ABNORMAL LOW (ref 13.0–17.0)
MCH: 31.2 pg (ref 26.0–34.0)
MCHC: 33.2 g/dL (ref 30.0–36.0)
MCV: 94.1 fL (ref 78.0–100.0)
PLATELETS: 258 10*3/uL (ref 150–400)
RBC: 3.91 MIL/uL — ABNORMAL LOW (ref 4.22–5.81)
RDW: 13.3 % (ref 11.5–15.5)
WBC: 13.1 10*3/uL — AB (ref 4.0–10.5)

## 2015-06-23 LAB — BODY FLUID CULTURE
CULTURE: NO GROWTH
Culture: NO GROWTH

## 2015-06-23 LAB — CULTURE, BODY FLUID-BOTTLE: CULTURE: NO GROWTH

## 2015-06-23 LAB — CULTURE, BODY FLUID W GRAM STAIN -BOTTLE

## 2015-06-23 MED ORDER — COLCHICINE 0.6 MG PO TABS
1.2000 mg | ORAL_TABLET | Freq: Once | ORAL | Status: AC
  Administered 2015-06-23: 1.2 mg via ORAL
  Filled 2015-06-23: qty 2

## 2015-06-23 MED ORDER — COLCHICINE 0.6 MG PO TABS
0.6000 mg | ORAL_TABLET | Freq: Two times a day (BID) | ORAL | Status: DC
  Administered 2015-06-23 – 2015-06-26 (×6): 0.6 mg via ORAL
  Filled 2015-06-23 (×8): qty 1

## 2015-06-23 NOTE — Progress Notes (Signed)
Subjective: 3 Days Post-Op Procedure(s) (LRB): INCISION AND DRAINAGE ABSCESS (Left)  Patient reports pain as mild to moderate.  Reports that his bilateral ankle pain as improved since starting the colchicine this am.  However, he continues to c/o swelling and some joint discomfort.  Denies fever, chills, N/V.  Objective:   VITALS:  Temp:  [98.8 F (37.1 C)-99.6 F (37.6 C)] 98.8 F (37.1 C) (09/04 0410) Pulse Rate:  [97-105] 97 (09/04 0410) Resp:  [20] 20 (09/04 0410) BP: (108-124)/(63-64) 108/63 mmHg (09/04 0410) SpO2:  [93 %-94 %] 94 % (09/04 0410)  General: WDWN patient in NAD. Psych:  Appropriate mood and affect. Neuro:  A&O x 3, Moving all extremities, sensation intact to light touch HEENT:  EOMs intact Chest:  Even non-labored respirations Skin:  Incision C/D/I, no rashes or lesions Extremities: warm/dry, mild edema bilateral ankles, no erythmea or echymosis.  No lymphadenopathy. Pulses: Dorsalis pedis and post tibialis 2+ MSK:  ROM: Ankle DF = 15 degrees, PF = 30 degrees, INV = 15 degrees, EV = 0-15 degrees, MMT:  Patient can perform quad set, (-) Homan's    LABS  Recent Labs  06/22/15 0615 06/23/15 0423  HGB 12.8* 12.2*  WBC 13.7* 13.1*  PLT 246 258    Recent Labs  06/21/15 0620 06/22/15 0615  NA 134* 128*  K 4.8 4.1  CL 99* 95*  CO2 30 27  BUN 16 17  CREATININE 0.72 0.94  GLUCOSE 111* 123*   No results for input(s): LABPT, INR in the last 72 hours.   Assessment/Plan: 3 Days Post-Op Procedure(s) (LRB): INCISION AND DRAINAGE ABSCESS (Left)  Knee joint cultures pending Aspirate R ankle joint and send for cultures D/C NPO status Continue colchicine  Procedure:  After gaining consent the R anteriomedial joint was sterile prepped with betadine and alcohol.  An 18 gauge needle was then injected into the R anteriormedial ankle joint and 2 cc mixture of primarily thick red fluid along with minimal straw colored fluid was aspirated.  A band-aid was then  placed over the injection site.  The patient tolerated the procedure well without complication.  Mechele Claude, PA-C, [employer] [employer] Office:  [phone]

## 2015-06-23 NOTE — Progress Notes (Addendum)
Subjective: 3 Days Post-Op Procedure(s) (LRB): INCISION AND DRAINAGE ABSCESS (Left)  Patient reports pain as moderate to severe in L and now R ankle.  Nurse states that he had a bad night with increased pain to bilateral ankles, and needed increased pain control.  States that he did not tolerate PT/OT very well yesterday, and that his R knee is now sore from putting all of his weight on it during therapy.  Patient continues to tolerate POs well.  Denies fever, chills, N/V, and previous history of gout.  States that he is on HCTZ, but has been on it for long period of time without previous complication.  Objective:   VITALS:  Temp:  [98.6 F (37 C)-99.6 F (37.6 C)] 98.8 F (37.1 C) (09/04 0410) Pulse Rate:  [97-105] 97 (09/04 0410) Resp:  [16-20] 20 (09/04 0410) BP: (108-128)/(63-69) 108/63 mmHg (09/04 0410) SpO2:  [93 %-94 %] 94 % (09/04 0410)  General: WDWN patient in NAD. Psych:  Appropriate mood and affect. Neuro:  A&O x 3, Moving all extremities, sensation intact to light touch HEENT:  EOMs intact Chest:  Even non-labored respirations Skin: Dressing C/D/I Extremities: warm/dry, mild edema in bilateral ankle, no erythmea, or echymosis.  No lymphadenopathy. Pulses: Dorsalis pedis and post tibialis 2+ MSK:  Extremely point tender to palpation of bilateral ankle joints.  ROM:  EHL/FHL intact., ankle ROM 0-15 degrees in all directions bilaterally with pain as limiting factor.   MMT: can perform quad set, but notes pain, (-) Homan's    LABS  Recent Labs  06/22/15 0615 06/23/15 0423  HGB 12.8* 12.2*  WBC 13.7* 13.1*  PLT 246 258    Recent Labs  06/21/15 0620 06/22/15 0615  NA 134* 128*  K 4.8 4.1  CL 99* 95*  CO2 30 27  BUN 16 17  CREATININE 0.72 0.94  GLUCOSE 111* 123*   No results for input(s): LABPT, INR in the last 72 hours.   Assessment/Plan: 3 Days Post-Op Procedure(s) (LRB): INCISION AND DRAINAGE ABSCESS (Left) Bilateral ankle pain  Cultures  pending To f/u with Dr. Megan Salon in office per ID IV vanc and D/C meds to be dictated per ID  Ankle pain: acute gout flare up vs septic joints.  NPO, started on colchicine.  Will monitor for symptom improvement.  D/C PT/OT  Mechele Claude, PA-C, [facility] Office:  [phone]

## 2015-06-24 LAB — CULTURE, BLOOD (ROUTINE X 2)
CULTURE: NO GROWTH
Culture: NO GROWTH

## 2015-06-24 LAB — GLUCOSE, SYNOVIAL FLUID: Glucose, Synovial Fluid: 98 mg/dL

## 2015-06-24 MED ORDER — POLYETHYLENE GLYCOL 3350 17 G PO PACK
17.0000 g | PACK | Freq: Every day | ORAL | Status: DC
Start: 2015-06-24 — End: 2015-06-26
  Administered 2015-06-24 – 2015-06-26 (×2): 17 g via ORAL
  Filled 2015-06-24 (×2): qty 1

## 2015-06-24 MED ORDER — BISACODYL 10 MG RE SUPP
10.0000 mg | Freq: Every day | RECTAL | Status: DC | PRN
  Administered 2015-06-25: 10 mg via RECTAL
  Filled 2015-06-24: qty 1

## 2015-06-24 NOTE — Progress Notes (Signed)
     Subjective: 4 Days Post-Op Procedure(s) (LRB): INCISION AND DRAINAGE ABSCESS (Left)   Seen in rounds with Dr. Gladstone Lighter.  Patient reports pain as mild, pain controlled.  States that he still has some redness and swelling of the ankles. The right ankle was aspirated yesterday, no results as of yet.  C/o constipation.  Objective:   VITALS:   Filed Vitals:   06/24/15 0514  BP: 120/71  Pulse: 94  Temp: 98.8 F (37.1 C)  Resp: 17    Dorsiflexion/Plantar flexion intact Incision: dressing C/D/I  LABS  Recent Labs  06/22/15 0615 06/23/15 0423  HGB 12.8* 12.2*  HCT 38.5* 36.8*  WBC 13.7* 13.1*  PLT 246 258     Recent Labs  06/22/15 0615  NA 128*  K 4.1  BUN 17  CREATININE 0.94  GLUCOSE 123*     Assessment/Plan: 4 Days Post-Op Procedure(s) (LRB): INCISION AND DRAINAGE ABSCESS (Left) Dressing changed Changed the constipation medications. Up with therapy Discharge home with home health eventually, when ready     [place]. Yadier Bramhall   PAC  06/24/2015, 9:04 AM

## 2015-06-25 LAB — CREATININE, SERUM
Creatinine, Ser: 0.78 mg/dL (ref 0.61–1.24)
GFR calc Af Amer: >60 mL/min (ref >60)
GFR calc non Af Amer: >60 mL/min (ref >60)

## 2015-06-25 LAB — VANCOMYCIN, TROUGH: VANCOMYCIN TR: 15 ug/mL (ref 10.0–20.0)

## 2015-06-25 MED ORDER — ALUM & MAG HYDROXIDE-SIMETH 200-200-20 MG/5ML PO SUSP
30.0000 mL | Freq: Four times a day (QID) | ORAL | Status: DC | PRN
  Administered 2015-06-25: 30 mL via ORAL
  Filled 2015-06-25: qty 30

## 2015-06-25 NOTE — Progress Notes (Signed)
Pt seen by Dr. Tonita Cong this afternoon. Reports L knee pain well controlled, improved. R ankle pain continues to improve. No other c/o. Has been getting up with PT.   On exam R ankle still painful with ROM but improved.   Assessment S/p L knee I&D x 5 days B/L ankle pain possible gout, improved with colchicine  Plan Dr. Tonita Cong discussed with ID earlier today, they are to re-eval the pt and determine any changes that need to be made to tx plan regarding the ankles Final cx pending for ankle aspirate, rare WBC and gm + and gm - organisms noted Continue abx, now being dosed per pharmacy Continue Lovenox for DVT ppx while in house, plan to resume Plavix at home  Continue PT for ambulation, strengthening Anticipate D/C tomorrow with close follow-up Discussed with Dr. Tonita Cong

## 2015-06-25 NOTE — Progress Notes (Signed)
Occupational Therapy Re eval Patient Details Name: Timothy Duran MRN: [id number] DOB: 06/16/1946 Today's Date: 06/25/2015    History of present illness L knee septic arthritis s/p I&D 06/20/15, gout in bilateral ankles.   OT comments  Pt improved this OT visit  Follow Up Recommendations  Home health OT;SNF;Supervision/Assistance - 24 hour    Equipment Recommendations  3 in 1 bedside comode       Precautions / Restrictions Precautions Precautions: Fall Required Braces or Orthoses: Knee Immobilizer - Left Knee Immobilizer - Left: On when out of bed or walking Restrictions Weight Bearing Restrictions: No Other Position/Activity Restrictions: WBAT       Mobility Bed Mobility   Bed Mobility: Supine to Sit     Supine to sit: Mod assist     General bed mobility comments: Increased time, cues for sequence and to self assist with R LE, physical assist to manage L LE, and trunk  Transfers Overall transfer level: Needs assistance Equipment used: Rolling walker (2 wheeled)   Sit to Stand: Mod assist;+2 physical assistance;+2 safety/equipment                  ADL                           Toilet Transfer: RW;+2 for safety/equipment;+2 for physical assistance;Cueing for sequencing;Cueing for safety;Stand-pivot;Moderate assistance Toilet Transfer Details (indicate cue type and reason): for urinal Toileting- Clothing Manipulation and Hygiene: Sit to/from stand;Cueing for sequencing;Cueing for safety;+2 for physical assistance;+2 for safety/equipment;Moderate assistance Toileting - Clothing Manipulation Details (indicate cue type and reason): for unrinal       General ADL Comments: bed raised for sit to stand.  pt doing better but pain still limiting                Cognition   Behavior During Therapy: WFL for tasks assessed/performed Overall Cognitive Status: Within Functional Limits for tasks assessed                       Extremity/Trunk  Assessment                          Pertinent Vitals/ Pain       Pain Score: 7  Pain Location: R ankle Pain Descriptors / Indicators: Sore Pain Intervention(s): Monitored during session     Prior Functioning/Environment              Frequency       Progress Toward Goals  OT Goals(current goals can now be found in the care plan section)  Progress towards OT goals: Progressing toward goals         Co-evaluation      Reason for Co-Treatment: For patient/therapist safety   OT goals addressed during session: ADL's and self-care      End of Session Equipment Utilized During Treatment: Rolling walker   Activity Tolerance Patient limited by pain   Patient Left in chair;with call bell/phone within reach   Nurse Communication Mobility status        Time: 2947-6546 OT Time Calculation (min): 15 min  Charges: OT General Charges $OT Visit: 1 Procedure OT Evaluation $OT Re-eval: 1 Procedure  [place], Thereasa Parkin 06/25/2015, 9:14 AM

## 2015-06-25 NOTE — Progress Notes (Signed)
Physical Therapy Treatment Patient Details Name: GEORDAN XU MRN: [id number] DOB: 1946-03-04 Today's Date: 06/25/2015    History of Present Illness L knee septic arthritis s/p I&D 06/20/15, gout in bilateral ankles.    PT Comments    Patient reports that  Pain persists but able to increase distance today.   Follow Up Recommendations  Home health PT;Supervision/Assistance - 24 hour     Equipment Recommendations  None recommended by PT    Recommendations for Other Services       Precautions / Restrictions Precautions Precautions: Fall Required Braces or Orthoses: Knee Immobilizer - Left Knee Immobilizer - Left: On when out of bed or walking    Mobility  Bed Mobility                  Transfers Overall transfer level: Needs assistance Equipment used: Rolling walker (2 wheeled) Transfers: Sit to/from Stand Sit to Stand: Mod assist;+2 physical assistance;+2 safety/equipment         General transfer comment: cues for LE management and use of UEs to self assist  Physical assist to bring wt up and over LEs and to balance in standing  Ambulation/Gait Ambulation/Gait assistance: Mod assist;+2 physical assistance;+2 safety/equipment Ambulation Distance (Feet): 8 Feet Assistive device: Rolling walker (2 wheeled) Gait Pattern/deviations: Step-to pattern;Decreased step length - right Gait velocity: decr   General Gait Details: cues for sequence, posture and positon from RW.  Physical assist for balance, support and RW management,  Pt tolerating increased wt on L LE but less on R   Stairs            Wheelchair Mobility    Modified Rankin (Stroke Patients Only)       Balance                                    Cognition Arousal/Alertness: Awake/alert                          Exercises      General Comments        Pertinent Vitals/Pain Pain Score: 7  Pain Location: R ankle Pain Intervention(s): Monitored during session     Home Living                      Prior Function            PT Goals (current goals can now be found in the care plan section)      Frequency  7X/week    PT Plan Current plan remains appropriate    Co-evaluation             End of Session Equipment Utilized During Treatment: Gait belt;Left knee immobilizer Activity Tolerance: Patient limited by pain;Patient limited by fatigue Patient left: in chair;with call bell/phone within reach     Time: 0847-0857 PT Time Calculation (min) (ACUTE ONLY): 10 min  Charges:  $Gait Training: 8-22 mins                    G Codes:      Claretha Cooper 06/25/2015, 1:14 PM

## 2015-06-25 NOTE — Progress Notes (Signed)
Patient ID: Timothy Duran, male   DOB: 08-22-1946, 69 y.o.   MRN: [id number]         [facility] for Infectious Disease    Date of Admission:  06/19/2015           Day 6 vancomycin        Day 4 levofloxacin  Principal Problem:   Septic arthritis of knee, left   . colchicine  0.6 mg Oral BID  . docusate sodium  100 mg Oral BID  . enoxaparin (LOVENOX) injection  40 mg Subcutaneous Q24H  . hydrochlorothiazide  6.25 mg Oral Daily  . irbesartan  75 mg Oral Daily  . levofloxacin  500 mg Oral Daily  . polyethylene glycol  17 g Oral Daily  . vancomycin  1,000 mg Intravenous Q12H    SUBJECTIVE: He is feeling better today. He walked in the hall with physical therapy. He is seated to go home.  Review of Systems: Pertinent items are noted in HPI.  Past Medical History  Diagnosis Date  . Hypertension   . Abdominal hernia     "in there now" (05/20/2015)  . TIA (transient ischemic attack) 2004  . Arthritis     "terrible; all over" (05/20/2015)  . Prostate cancer   . Basal cell carcinoma     "left neck"  . Epistaxis 05/20/2015    hospitalized    Social History  Substance Use Topics  . Smoking status: Former Smoker -- 1.00 packs/day for 4 years    Types: Cigarettes    Quit date: 08/29/2008  . Smokeless tobacco: Never Used  . Alcohol Use: No    No family history on file. Allergies  Allergen Reactions  . Other     Nasal packing- headaches and facial pain  . Penicillins     Blisters     OBJECTIVE: Filed Vitals:   06/24/15 2106 06/25/15 0057 06/25/15 0920 06/25/15 1443  BP: 141/74 148/77 131/75 132/73  Pulse: 93 103 92 88  Temp: 98.2 F (36.8 C) 98.7 F (37.1 C)  98.1 F (36.7 C)  TempSrc: Oral Oral  Oral  Resp: 19 18  18   Height:      Weight:      SpO2: 94% 92%  94%   Body mass index is 28.44 kg/(m^2).  General: He is sleeping quietly but arouses easily. He is in no distress Skin: No rash Lungs: Clear Cor: Regular S1 and S2 with no murmur Abdomen:  Nontender Decrease pain and left ankle and knee  Lab Results Lab Results  Component Value Date   WBC 13.1* 06/23/2015   HGB 12.2* 06/23/2015   HCT 36.8* 06/23/2015   MCV 94.1 06/23/2015   PLT 258 06/23/2015    Lab Results  Component Value Date   CREATININE 0.78 06/25/2015   BUN 17 06/22/2015   NA 128* 06/22/2015   K 4.1 06/22/2015   CL 95* 06/22/2015   CO2 27 06/22/2015    Lab Results  Component Value Date   ALT 17 05/08/2015   AST 16 05/08/2015   ALKPHOS 89 05/08/2015   BILITOT 0.3 05/08/2015     Microbiology: Recent Results (from the past 240 hour(s))  Culture, body fluid-bottle     Status: None   Collection Time: 06/18/15 12:18 PM  Result Value Ref Range Status   Specimen Description FLUID SYNOVIAL LEFT KNEE  Final   Special Requests NONE  Final   Culture   Final    NO GROWTH 5 DAYS Performed  at [facility]    Report Status 06/23/2015 FINAL  Final  Gram stain     Status: None   Collection Time: 06/18/15 12:19 PM  Result Value Ref Range Status   Specimen Description FLUID SYNOVIAL LEFT KNEE  Final   Special Requests NONE  Final   Gram Stain   Final    WBC PRESENT,BOTH PMN AND MONONUCLEAR NO ORGANISMS SEEN CYTOSPIN Performed at [facility]    Report Status 06/18/2015 FINAL  Final  Body fluid culture     Status: None   Collection Time: 06/19/15  7:48 PM  Result Value Ref Range Status   Specimen Description KNEE LEFT  Final   Special Requests NONE  Final   Gram Stain   Final    WBC PRESENT, PREDOMINANTLY PMN NO ORGANISMS SEEN CYTOSPIN Gram Stain Report Called to,Read Back By and Verified With: Cathe Mons [phone] @ 2149 BY J SCOTTON    Culture   Final    NO GROWTH 3 DAYS Performed at [facility]    Report Status 06/23/2015 FINAL  Final  Blood culture (routine x 2)     Status: None   Collection Time: 06/19/15  8:06 PM  Result Value Ref Range Status   Specimen Description BLOOD RIGHT FOREARM  Final   Special Requests  BOTTLES DRAWN AEROBIC AND ANAEROBIC 5ML  Final   Culture   Final    NO GROWTH 5 DAYS Performed at [facility]    Report Status 06/24/2015 FINAL  Final  Blood culture (routine x 2)     Status: None   Collection Time: 06/19/15  8:15 PM  Result Value Ref Range Status   Specimen Description BLOOD LEFT ANTECUBITAL  Final   Special Requests BOTTLES DRAWN AEROBIC AND ANAEROBIC 5CC  Final   Culture   Final    NO GROWTH 5 DAYS Performed at [facility]    Report Status 06/24/2015 FINAL  Final  Anaerobic culture     Status: None (Preliminary result)   Collection Time: 06/20/15 12:33 AM  Result Value Ref Range Status   Specimen Description SYNOVIAL LEFT KNEE  Final   Special Requests PATIENT ON FOLLOWING VANCOMYCIN  Final   Gram Stain   Final    ABUNDANT WBC PRESENT,BOTH PMN AND MONONUCLEAR NO ORGANISMS SEEN    Culture   Final    NO ANAEROBES ISOLATED; CULTURE IN PROGRESS FOR 5 DAYS Performed at [facility]    Report Status PENDING  Incomplete  Body fluid culture     Status: None   Collection Time: 06/20/15 12:33 AM  Result Value Ref Range Status   Specimen Description SYNOVIAL LEFT KNEE  Final   Special Requests PATIENT ON FOLLOWING VANCOMYCIN  Final   Gram Stain   Final    ABUNDANT WBC PRESENT,BOTH PMN AND MONONUCLEAR NO ORGANISMS SEEN    Culture   Final    NO GROWTH 3 DAYS Performed at [facility]    Report Status 06/23/2015 FINAL  Final  Anaerobic culture     Status: None (Preliminary result)   Collection Time: 06/23/15  4:08 PM  Result Value Ref Range Status   Specimen Description SYNOVIAL RIGHT ANKLE  Final   Special Requests NONE  Final   Gram Stain   Final    RARE WBC PRESENT, PREDOMINANTLY PMN RARE GRAM NEGATIVE RODS RARE GRAM POSITIVE COCCI GRAM STAIN REVIEWED-AGREE WITH RESULT M VESTAL    Culture   Final  NO ANAEROBES ISOLATED; CULTURE IN PROGRESS FOR 5 DAYS Performed at [facility]    Report Status PENDING   Incomplete  Body fluid culture     Status: None (Preliminary result)   Collection Time: 06/23/15  4:08 PM  Result Value Ref Range Status   Specimen Description FLUID SYNOVIAL ANKLE  Final   Special Requests NONE  Final   Gram Stain   Final    RARE WBC PRESENT, PREDOMINANTLY PMN RARE GRAM NEGATIVE RODS RARE GRAM POSITIVE COCCI GRAM STAIN REVIEWED-AGREE WITH RESULT M VESTAL    Culture   Final    NO GROWTH 2 DAYS Performed at [facility]    Report Status PENDING  Incomplete     ASSESSMENT: All cultures from his left knee have been negative. Confirmed a Gram stain from left ankle synovial fluid showed gram-positive cocci and gram-negative rods. Left ankle cultures are also negative. I will plan on continuing IV vancomycin and oral levofloxacin for a minimum of 3 weeks.  PLAN: 1. Continue current antibiotics until his follow-up visit with me on 07/11/2015  Michel Bickers, [place] [facility] [phone] pager   [phone] cell 06/25/2015, 3:58 PM

## 2015-06-25 NOTE — Progress Notes (Signed)
ANTIBIOTIC CONSULT NOTE - INITIAL  Pharmacy Consult for vancomycin Indication: presumed bacterial septic arthritis  Allergies  Allergen Reactions  . Other     Nasal packing- headaches and facial pain  . Penicillins     Blisters     Patient Measurements: Height: 5\' 8"  (172.7 cm) Weight: 187 lb (84.823 kg) IBW/kg (Calculated) : 68.4  Vital Signs: Temp: 98.7 F (37.1 C) (09/06 0057) Temp Source: Oral (09/06 0057) BP: 131/75 mmHg (09/06 0920) Pulse Rate: 92 (09/06 0920) Intake/Output from previous day: 09/05 0701 - 09/06 0700 In: 880 [P.O.:480; I.V.:400] Out: 500 [Urine:500] Intake/Output from this shift:    Labs:  Recent Labs  06/23/15 0423  WBC 13.1*  HGB 12.2*  PLT 258   Estimated Creatinine Clearance: 76.5 mL/min (by C-G formula based on Cr of 0.94). No results for input(s): VANCOTROUGH, VANCOPEAK, VANCORANDOM, GENTTROUGH, GENTPEAK, GENTRANDOM, TOBRATROUGH, TOBRAPEAK, TOBRARND, AMIKACINPEAK, AMIKACINTROU, AMIKACIN in the last 72 hours.   Microbiology: Recent Results (from the past 720 hour(s))  Culture, body fluid-bottle     Status: None   Collection Time: 06/18/15 12:18 PM  Result Value Ref Range Status   Specimen Description FLUID SYNOVIAL LEFT KNEE  Final   Special Requests NONE  Final   Culture   Final    NO GROWTH 5 DAYS Performed at [facility]    Report Status 06/23/2015 FINAL  Final  Gram stain     Status: None   Collection Time: 06/18/15 12:19 PM  Result Value Ref Range Status   Specimen Description FLUID SYNOVIAL LEFT KNEE  Final   Special Requests NONE  Final   Gram Stain   Final    WBC PRESENT,BOTH PMN AND MONONUCLEAR NO ORGANISMS SEEN CYTOSPIN Performed at [facility]    Report Status 06/18/2015 FINAL  Final  Body fluid culture     Status: None   Collection Time: 06/19/15  7:48 PM  Result Value Ref Range Status   Specimen Description KNEE LEFT  Final   Special Requests NONE  Final   Gram Stain   Final    WBC  PRESENT, PREDOMINANTLY PMN NO ORGANISMS SEEN CYTOSPIN Gram Stain Report Called to,Read Back By and Verified With: Cathe Mons [phone] @ 2149 BY J SCOTTON    Culture   Final    NO GROWTH 3 DAYS Performed at [facility]    Report Status 06/23/2015 FINAL  Final  Blood culture (routine x 2)     Status: None   Collection Time: 06/19/15  8:06 PM  Result Value Ref Range Status   Specimen Description BLOOD RIGHT FOREARM  Final   Special Requests BOTTLES DRAWN AEROBIC AND ANAEROBIC 5ML  Final   Culture   Final    NO GROWTH 5 DAYS Performed at [facility]    Report Status 06/24/2015 FINAL  Final  Blood culture (routine x 2)     Status: None   Collection Time: 06/19/15  8:15 PM  Result Value Ref Range Status   Specimen Description BLOOD LEFT ANTECUBITAL  Final   Special Requests BOTTLES DRAWN AEROBIC AND ANAEROBIC 5CC  Final   Culture   Final    NO GROWTH 5 DAYS Performed at [facility]    Report Status 06/24/2015 FINAL  Final  Anaerobic culture     Status: None (Preliminary result)   Collection Time: 06/20/15 12:33 AM  Result Value Ref Range Status   Specimen Description SYNOVIAL LEFT KNEE  Final   Special Requests PATIENT  ON FOLLOWING VANCOMYCIN  Final   Gram Stain   Final    ABUNDANT WBC PRESENT,BOTH PMN AND MONONUCLEAR NO ORGANISMS SEEN    Culture   Final    NO ANAEROBES ISOLATED; CULTURE IN PROGRESS FOR 5 DAYS Performed at [facility]    Report Status PENDING  Incomplete  Body fluid culture     Status: None   Collection Time: 06/20/15 12:33 AM  Result Value Ref Range Status   Specimen Description SYNOVIAL LEFT KNEE  Final   Special Requests PATIENT ON FOLLOWING VANCOMYCIN  Final   Gram Stain   Final    ABUNDANT WBC PRESENT,BOTH PMN AND MONONUCLEAR NO ORGANISMS SEEN    Culture   Final    NO GROWTH 3 DAYS Performed at [facility]    Report Status 06/23/2015 FINAL  Final  Anaerobic culture     Status: None (Preliminary result)    Collection Time: 06/23/15  4:08 PM  Result Value Ref Range Status   Specimen Description SYNOVIAL RIGHT ANKLE  Final   Special Requests NONE  Final   Gram Stain   Final    RARE WBC PRESENT, PREDOMINANTLY PMN RARE GRAM NEGATIVE RODS RARE GRAM POSITIVE COCCI GRAM STAIN REVIEWED-AGREE WITH RESULT M VESTAL    Culture   Final    NO ANAEROBES ISOLATED; CULTURE IN PROGRESS FOR 5 DAYS Performed at [facility]    Report Status PENDING  Incomplete  Body fluid culture     Status: None (Preliminary result)   Collection Time: 06/23/15  4:08 PM  Result Value Ref Range Status   Specimen Description FLUID SYNOVIAL ANKLE  Final   Special Requests NONE  Final   Gram Stain   Final    RARE WBC PRESENT, PREDOMINANTLY PMN RARE GRAM NEGATIVE RODS RARE GRAM POSITIVE COCCI GRAM STAIN REVIEWED-AGREE WITH RESULT M VESTAL    Culture   Final    NO GROWTH < 24 HOURS Performed at [facility]    Report Status PENDING  Incomplete    Medical History: Past Medical History  Diagnosis Date  . Hypertension   . Abdominal hernia     "in there now" (05/20/2015)  . TIA (transient ischemic attack) 2004  . Arthritis     "terrible; all over" (05/20/2015)  . Prostate cancer   . Basal cell carcinoma     "left neck"  . Epistaxis 05/20/2015    hospitalized   Assessment: Patient's a 69 y.o M currently on antibiotics for presumed septic arthritis-- s/p  L knee abscess I&D on 9/01.  ID recom treating for 3 weeks (thru 9/22). Ortho team has been managing patient's vancomycin since 9/1.  Spoke to ortho PA [facility] on 9/6, ok for pharmacy to manage vancomycin for patient.  - afeb, wbc elevated on 9/4, scr 0.94 on 9/3  8/31>>Vancomycin MD dosing  >>   9/2 >> LVQ PO per MD>>   9/1: L knee synovial: neg FINAL 9/1 anaerobic L knee synovial: ngtd 8/31: Body fluid (knee asp):neg FINAL 8/31 bcx x2: neg FINAL 9/04 ankle synovial fluid: rare GNR, GPC 9/4 R ankle synovial: rare GNR, GPC   Goal of  Therapy:  Vancomycin trough level 15-20 mcg/ml  Plan:  - continue vancomycin 1gm IV q12h  - will check vancomycin trough level with 9PM dose tonight to assess current regimen - check serum creatinine today  Sruthi Maurer P 06/25/2015,9:36 AM

## 2015-06-25 NOTE — Progress Notes (Signed)
PHARMACY - VANCOMYCIN (brief note)  Patient on Vancomycin 1gm IV q12h for presumed bacterial septic arthritis  Vancomycin trough level = 15 mcg/ml (goal 15-20)  CrCl = 89 ml/min Afebrile  Plan:  Continue Vancomycin 1gm IV q12h            F/U AM  Leone Haven, PharmD 06/25/15 @ 21:38

## 2015-06-26 LAB — ANAEROBIC CULTURE

## 2015-06-26 MED ORDER — VANCOMYCIN HCL IN DEXTROSE 1-5 GM/200ML-% IV SOLN: INTRAVENOUS | Status: DC

## 2015-06-26 MED ORDER — HEPARIN SOD (PORK) LOCK FLUSH 100 UNIT/ML IV SOLN
250.0000 [IU] | INTRAVENOUS | Status: AC | PRN
  Administered 2015-06-26: 250 [IU]

## 2015-06-26 MED ORDER — LEVOFLOXACIN 500 MG PO TABS: 500.0000 mg | ORAL_TABLET | Freq: Every day | ORAL | Status: DC

## 2015-06-26 MED ORDER — COLCHICINE 0.6 MG PO TABS: 0.6000 mg | ORAL_TABLET | Freq: Two times a day (BID) | ORAL | Status: DC

## 2015-06-26 NOTE — Progress Notes (Signed)
Subjective: 6 Days Post-Op Procedure(s) (LRB): INCISION AND DRAINAGE ABSCESS (Left) Patient reports pain as 2 on 0-10 scale.    Objective: Vital signs in last 24 hours: Temp:  [97.5 F (36.4 C)-99 F (37.2 C)] 99 F (37.2 C) (09/07 1048) Pulse Rate:  [82-92] 82 (09/07 1048) Resp:  [18] 18 (09/07 1048) BP: (124-142)/(73-109) 139/109 mmHg (09/07 1048) SpO2:  [94 %-96 %] 96 % (09/07 1048)  Intake/Output from previous day: 09/06 0701 - 09/07 0700 In: 1748.6 [P.O.:720; I.V.:1028.6] Out: 1300 [Urine:1300] Intake/Output this shift:    No results for input(s): HGB in the last 72 hours. No results for input(s): WBC, RBC, HCT, PLT in the last 72 hours.  Recent Labs  06/25/15 0940  CREATININE 0.78   No results for input(s): LABPT, INR in the last 72 hours.  Neurologically intact No cellulitis present Compartment soft Moving ankles without pain. Walked in PT well. Had Two weeks of left ankle pain prior to knee pain. Some swelling in both ankles.  Assessment/Plan: 6 Days Post-Op Procedure(s) (LRB): INCISION AND DRAINAGE ABSCESS (Left) Doing well. Discussed right ankle aspirate with patient and Dr. Megan Salon. No growth. Rare orgs no crystals. Given doing well clinically feel surgical treatment of ankle not indicated currently. F/U in one week.  Dennis Killilea C 06/26/2015, 11:07 AM

## 2015-06-26 NOTE — Discharge Summary (Signed)
Patient ID: Timothy Duran MRN: [id number] DOB/AGE: [birth year] 69 y.o.  Admit date: 06/19/2015 Discharge date: 06/26/2015  Admission Diagnoses:  Principal Problem:   Septic arthritis of knee, left   Discharge Diagnoses:  Septic arthritis L knee s/p I&D Acute gout attack B/L ankles  Past Medical History  Diagnosis Date  . Hypertension   . Abdominal hernia     "in there now" (05/20/2015)  . TIA (transient ischemic attack) 2004  . Arthritis     "terrible; all over" (05/20/2015)  . Prostate cancer   . Basal cell carcinoma     "left neck"  . Epistaxis 05/20/2015    hospitalized    Surgeries: Procedure(s): INCISION AND DRAINAGE ABSCESS on 06/19/2015 - 06/20/2015   Consultants:  ID  Discharged Condition: Improved  Hospital Course: Timothy Duran is an 69 y.o. male who was admitted 06/19/2015 for operative treatment ofSeptic arthritis of knee, left. Patient has severe unremitting pain that affects sleep, daily activities, and work/hobbies. After pre-op clearance the patient was taken to the operating room on 06/19/2015 - 06/20/2015 and underwent  Procedure(s): INCISION AND DRAINAGE ABSCESS L knee  Patient was given perioperative antibiotics: Anti-infectives    Start     Dose/Rate Route Frequency Ordered Stop   06/26/15 0000  levofloxacin (LEVAQUIN) 500 MG tablet     500 mg Oral Daily 06/26/15 1126     06/26/15 0000  vancomycin (VANCOCIN) 1 GM/200ML SOLN  Status:  Discontinued     over 60 Minutes   06/26/15 1126 06/26/15    06/26/15 0000  vancomycin (VANCOCIN) 1 GM/200ML SOLN     over 60 Minutes   06/26/15 1430     06/21/15 1400  levofloxacin (LEVAQUIN) tablet 500 mg     500 mg Oral Daily 06/21/15 1141     06/20/15 0900  vancomycin (VANCOCIN) IVPB 1000 mg/200 mL premix     1,000 mg 200 mL/hr over 60 Minutes Intravenous Every 12 hours 06/20/15 0237     06/19/15 2100  vancomycin (VANCOCIN) 1,500 mg in sodium chloride 0.9 % 500 mL IVPB  Status:  Discontinued     1,500 mg 250 mL/hr over 120  Minutes Intravenous Every 12 hours 06/19/15 2011 06/20/15 0237   06/19/15 2015  vancomycin (VANCOCIN) IVPB 1000 mg/200 mL premix  Status:  Discontinued     1,000 mg 200 mL/hr over 60 Minutes Intravenous  Once 06/19/15 2008 06/19/15 2011       Patient was given sequential compression devices, early ambulation, and chemoprophylaxis to prevent DVT. He started noting ankle pain bilaterally on 9/2, improved some with colchicine (no hx of gout). R ankle was aspirated 9/4 positive for rare WBC, gm + and - organisms, no crystals. ID reconsulted with no change in abx- vanco and levaquin. Symptoms improving with continued conservative tx. It was felt best to continue to watch the ankles as his symptoms are improved and aspirate again serially if needed as opposed to proceed with any I&D.  Patient benefited maximally from hospital stay and there were no complications.    Recent vital signs: Patient Vitals for the past 24 hrs:  BP Temp Temp src Pulse Resp SpO2  06/26/15 1048 (!) 139/109 mmHg 99 F (37.2 C) Oral 82 18 96 %  06/26/15 0555 (!) 142/78 mmHg 97.5 F (36.4 C) Oral 86 18 95 %  06/25/15 2216 124/73 mmHg 98.2 F (36.8 C) Oral 92 18 96 %  06/25/15 1443 132/73 mmHg 98.1 F (36.7 C) Oral 88 18 94 %  Recent laboratory studies:  Recent Labs  06/25/15 0940  CREATININE 0.78     Discharge Medications:     Medication List    STOP taking these medications        HYDROcodone-acetaminophen 5-325 MG per tablet  Commonly known as:  NORCO      TAKE these medications        acetaminophen 500 MG tablet  Commonly known as:  TYLENOL  Take 1,500 mg by mouth as needed for mild pain or moderate pain.     clopidogrel 75 MG tablet  Commonly known as:  PLAVIX  TAKE ONE TABLET BY MOUTH ONCE DAILY     colchicine 0.6 MG tablet  Take 1 tablet (0.6 mg total) by mouth 2 (two) times daily.     docusate sodium 100 MG capsule  Commonly known as:  COLACE  Take 1 capsule (100 mg total) by mouth 2  (two) times daily.     lansoprazole 30 MG capsule  Commonly known as:  PREVACID  Take 1 capsule (30 mg total) by mouth daily at 12 noon.     levofloxacin 500 MG tablet  Commonly known as:  LEVAQUIN  Take 1 tablet (500 mg total) by mouth daily.     olmesartan-hydrochlorothiazide 20-12.5 MG per tablet  Commonly known as:  BENICAR HCT  Take 1 tablet by mouth daily.     oxyCODONE 5 MG immediate release tablet  Commonly known as:  Oxy IR/ROXICODONE  Take 1-2 tablets (5-10 mg total) by mouth every 4 (four) hours as needed for severe pain.     vancomycin 1 GM/200ML Soln  Commonly known as:  VANCOCIN  Dosing per [facility]        Diagnostic Studies: Dg Knee Complete 4 Views Left  06/18/2015   CLINICAL DATA:  69 year old male with knee pain. No injury. Initial encounter.  EXAM: LEFT KNEE - COMPLETE 4+ VIEW  COMPARISON:  None.  FINDINGS: Mild tricompartment degenerative changes.  Chondrocalcinosis (CPPD) changes.  Moderate size suprapatellar joint effusion.  Vascular calcifications.  No fracture or dislocation.  IMPRESSION: Mild tricompartment degenerative changes.  Chondrocalcinosis (CPPD) changes.  Moderate size suprapatellar joint effusion.   Electronically Signed   By: Genia Del M.D.   On: 06/18/2015 10:23    Disposition: 01-Home or Self Care      Discharge Instructions    Call MD / Call 911    Complete by:  As directed   If you experience chest pain or shortness of breath, CALL 911 and be transported to the hospital emergency room.  If you develope a fever above 101 F, pus (white drainage) or increased drainage or redness at the wound, or calf pain, call your surgeon's office.     Constipation Prevention    Complete by:  As directed   Drink plenty of fluids.  Prune juice may be helpful.  You may use a stool softener, such as Colace (over the counter) 100 mg twice a day.  Use MiraLax (over the counter) for constipation as needed.     Diet - low sodium heart healthy     Complete by:  As directed      Increase activity slowly as tolerated    Complete by:  As directed            Follow-up Information    Follow up with [facility].   Why:  [facility] nurse for home IV antibiotics and Picc line administration   Contact information:  [address] [place] [address] [phone]       Follow up with Johnn Hai, MD In 1 week.   Specialty:  Orthopedic Surgery   Why:  For suture removal   Contact information:   [address] [place] [address] [phone]       Follow up with Michel Bickers, MD In 3 weeks.   Specialty:  Infectious Diseases   Contact information:   [address] [place] [address] [phone]        Signed: Cecilie Kicks. 06/26/2015, 2:36 PM

## 2015-06-26 NOTE — Progress Notes (Signed)
Physical Therapy Treatment Patient Details Name: Timothy Duran MRN: [id number] DOB: December 13, 1945 Today's Date: 06/26/2015    History of Present Illness L knee septic arthritis s/p I&D 06/20/15, gout in bilateral ankles.    PT Comments    POD # 6 assisted pt OOB to amb in hallway with spouse.  Spouse educated on KI use and proper application.  Had spouse assist with all mobility.  Pt plans to D/C to home today.   Follow Up Recommendations  Home health PT;Supervision/Assistance - 24 hour     Equipment Recommendations  None recommended by PT    Recommendations for Other Services       Precautions / Restrictions Precautions Precautions: Fall Required Braces or Orthoses: Knee Immobilizer - Left Knee Immobilizer - Left: On when out of bed or walking Restrictions Weight Bearing Restrictions: No Other Position/Activity Restrictions: WBAT    Mobility  Bed Mobility Overal bed mobility: Needs Assistance Bed Mobility: Supine to Sit;Sit to Supine     Supine to sit: Min guard Sit to supine: Min guard   General bed mobility comments: pt able to self rise with use of rail and increased time.  Pt was able to lift own L LE onto bed.  Transfers Overall transfer level: Needs assistance Equipment used: Rolling walker (2 wheeled) Transfers: Sit to/from Stand Sit to Stand: Min guard         General transfer comment: pt prefers to lean forward and pull self up to stand.  Excessive WBing thru walker for support.    Ambulation/Gait Ambulation/Gait assistance: Min assist;Min guard Ambulation Distance (Feet): 25 Feet Assistive device: Rolling walker (2 wheeled) Gait Pattern/deviations: Step-to pattern;Decreased step length - right;Decreased step length - left;Trunk flexed Gait velocity: decreased   General Gait Details: 25% VC's on proper walker to self distance and safety with turns.  Slow gait but steady.  Mild c/o R ankle pain.    Stairs            Wheelchair Mobility     Modified Rankin (Stroke Patients Only)       Balance                                    Cognition Arousal/Alertness: Awake/alert Behavior During Therapy: WFL for tasks assessed/performed Overall Cognitive Status: Within Functional Limits for tasks assessed                      Exercises      General Comments        Pertinent Vitals/Pain Pain Assessment: 0-10 Pain Score: 5  Pain Location: R ankle Pain Descriptors / Indicators: Constant Pain Intervention(s): Monitored during session;Repositioned;Ice applied    Home Living                      Prior Function            PT Goals (current goals can now be found in the care plan section) Progress towards PT goals: Progressing toward goals    Frequency  7X/week    PT Plan Current plan remains appropriate    Co-evaluation             End of Session Equipment Utilized During Treatment: Gait belt;Left knee immobilizer Activity Tolerance: Patient tolerated treatment well Patient left: in bed;with call bell/phone within reach;with nursing/sitter in room     Time: 1005-1030 PT Time Calculation (min) (  ACUTE ONLY): 25 min  Charges:  $Gait Training: 8-22 mins $Therapeutic Activity: 8-22 mins                    G Codes:      Rica Koyanagi  PTA [facility]  Acute  Rehab Pager      [phone]

## 2015-06-26 NOTE — Discharge Instructions (Signed)
Weight bear as tolerated left leg in immobilizer May remove immobilizer to work on GROM/PT exercises Ice and elevate R leg toes above the nose to reduce swelling, 20 min each 5x/day Resume Plavix daily Follow up with Dr. Tonita Cong 2 weeks post-op (1 week from discharge)  Follow up with Infectious Disease 3 weeks post-op

## 2015-06-26 NOTE — Progress Notes (Signed)
Occupational Therapy Treatment Patient Details Name: Timothy Duran MRN: [id number] DOB: 23-Jun-1946 Today's Date: 06/26/2015    History of present illness L knee septic arthritis s/p I&D 06/20/15, gout in bilateral ankles.   OT comments  Education complete with wife and pt  Follow Up Recommendations  Home health OT;;Supervision/Assistance - 24 hour    Equipment Recommendations  3 in 1 bedside comode    Recommendations for Other Services      Precautions / Restrictions Precautions Precautions: Fall Required Braces or Orthoses: Knee Immobilizer - Left Knee Immobilizer - Left: On when out of bed or walking Restrictions Weight Bearing Restrictions: No       Mobility Bed Mobility   Bed Mobility: Supine to Sit     Supine to sit: Min assist     General bed mobility comments: Increased time, cues for sequence and to self assist with R LE, physical assist to manage L LE, and trunk  Transfers Overall transfer level: Needs assistance Equipment used: Rolling walker (2 wheeled) Transfers: Sit to/from Stand Sit to Stand: Mod assist              Balance                                   ADL Overall ADL's : Needs assistance/impaired     Grooming: Wash/dry face;Sitting               Lower Body Dressing: Sit to/from stand;Moderate assistance   Toilet Transfer: Minimal assistance;RW           Functional mobility during ADLs: Cueing for sequencing;Cueing for safety;Moderate assistance General ADL Comments: educated pt and wife on LB dressing tecnhique. Pt will only be performing sponge bath per wife      Vision                     Perception     Praxis      Cognition   Behavior During Therapy: WFL for tasks assessed/performed Overall Cognitive Status: Within Functional Limits for tasks assessed                       Extremity/Trunk Assessment               Exercises     Shoulder Instructions       General  Comments      Pertinent Vitals/ Pain       Pain Score: 5  Pain Location: r ankle Pain Descriptors / Indicators: Sore Pain Intervention(s): Monitored during session  Home Living                                          Prior Functioning/Environment              Frequency Min 2X/week     Progress Toward Goals  OT Goals(current goals can now be found in the care plan section)        Plan Discharge plan remains appropriate    Co-evaluation                 End of Session Equipment Utilized During Treatment: Rolling walker   Activity Tolerance Patient tolerated treatment well   Patient Left in chair;with call bell/phone within reach   Nurse Communication Mobility  status        Time: 1225-1241 OT Time Calculation (min): 16 min  Charges: OT General Charges $OT Visit: 1 Procedure OT Treatments $Self Care/Home Management : 8-22 mins  Fe Okubo, Edwena Felty D 06/26/2015, 1:28 PM

## 2015-06-27 LAB — BODY FLUID CULTURE: Culture: NO GROWTH

## 2015-06-29 LAB — ANAEROBIC CULTURE

## 2015-07-11 ENCOUNTER — Encounter: Payer: Self-pay | Admitting: Internal Medicine

## 2015-07-11 ENCOUNTER — Ambulatory Visit (INDEPENDENT_AMBULATORY_CARE_PROVIDER_SITE_OTHER): Payer: Medicare Other | Admitting: Internal Medicine

## 2015-07-11 VITALS — BP 138/90 | HR 117 | Temp 97.6°F | Ht 68.0 in | Wt 184.0 lb

## 2015-07-11 DIAGNOSIS — M009 Pyogenic arthritis, unspecified: Secondary | ICD-10-CM | POA: Diagnosis not present

## 2015-07-11 DIAGNOSIS — Z23 Encounter for immunization: Secondary | ICD-10-CM | POA: Diagnosis not present

## 2015-07-11 NOTE — Assessment & Plan Note (Signed)
He has had dramatic clinical improvement and his inflammatory marker has returned to normal. I suspect that any infection has now been cured. I will stop his antibiotics and have his PICC removed. He was given an influenza vaccine today. He will return to see me in 6 weeks.

## 2015-07-11 NOTE — Progress Notes (Signed)
Phone call to Mild[facility].  Per Dr. Megan Salon D/C IV Antibiotics and D/C PICC,  Pharmacy Tech verbalized back these orders.

## 2015-07-11 NOTE — Progress Notes (Signed)
Patient ID: Timothy Duran, male   DOB: 1945/11/11, 69 y.o.   MRN: [id number]         [facility] for Infectious Disease  Patient Active Problem List   Diagnosis Date Noted  . Septic arthritis of knee, left 06/20/2015    Priority: High  . Epistaxis 05/20/2015  . Bleeding nose 05/20/2015  . Gastroesophageal reflux disease without esophagitis 05/13/2015  . Hypertension 08/31/2013  . TIA (transient ischemic attack) 08/31/2013    Patient's Medications  New Prescriptions   No medications on file  Previous Medications   ACETAMINOPHEN (TYLENOL) 500 MG TABLET    Take 1,500 mg by mouth as needed for mild pain or moderate pain.    CLOPIDOGREL (PLAVIX) 75 MG TABLET    TAKE ONE TABLET BY MOUTH ONCE DAILY   COLCHICINE 0.6 MG TABLET    Take 1 tablet (0.6 mg total) by mouth 2 (two) times daily.   DOCUSATE SODIUM (COLACE) 100 MG CAPSULE    Take 1 capsule (100 mg total) by mouth 2 (two) times daily.   LANSOPRAZOLE (PREVACID) 30 MG CAPSULE    Take 1 capsule (30 mg total) by mouth daily at 12 noon.   OLMESARTAN-HYDROCHLOROTHIAZIDE (BENICAR HCT) 20-12.5 MG PER TABLET    Take 1 tablet by mouth daily.   OXYCODONE (OXY IR/ROXICODONE) 5 MG IMMEDIATE RELEASE TABLET    Take 1-2 tablets (5-10 mg total) by mouth every 4 (four) hours as needed for severe pain.  Modified Medications   No medications on file  Discontinued Medications   LEVOFLOXACIN (LEVAQUIN) 500 MG TABLET    Take 1 tablet (500 mg total) by mouth daily.   VANCOMYCIN (VANCOCIN) 1 GM/200ML SOLN    Dosing per [facility]    Subjective: Mr. Dollar is in with his wife and daughter for his hospital follow-up visit. He was hospitalized last month with acute left knee pain and swelling. He developed left ankle pain and swelling while hospitalized. Synovial fluid white cell counts suggested possible septic arthritis. All gram stains of synovial fluid from his left knee were negative for organisms and all cultures were negative. Gram stain of  left ankle synovial fluid showed gram-positive cocci and gram-negative rods. Left ankle cultures were also negative. No crystals were seen on synovial fluid exam. Because of concerns for septic arthritis he was discharged on IV vancomycin and oral levofloxacin. He was also treated empirically for gout with culture seen. He's had no problems tolerating his PICC or antibiotics. He is doing much better. He's not having any significant joint pain. He is not requiring any pain medication. He hopes to return to work at [employer], soon. He has not had any fever, chills or sweats.  Review of Systems: Pertinent items are noted in HPI.  Past Medical History  Diagnosis Date  . Hypertension   . Abdominal hernia     "in there now" (05/20/2015)  . TIA (transient ischemic attack) 2004  . Arthritis     "terrible; all over" (05/20/2015)  . Prostate cancer   . Basal cell carcinoma     "left neck"  . Epistaxis 05/20/2015    hospitalized    Social History  Substance Use Topics  . Smoking status: Former Smoker -- 1.00 packs/day for 4 years    Types: Cigarettes    Quit date: 08/29/2008  . Smokeless tobacco: Never Used  . Alcohol Use: No    No family history on file.  Allergies  Allergen Reactions  . Other  Nasal packing- headaches and facial pain  . Penicillins     Blisters     Objective: Filed Vitals:   07/11/15 0927  BP: 138/90  Pulse: 117  Temp: 97.6 F (36.4 C)  TempSrc: Oral  Height: 5\' 8"  (1.727 m)  Weight: 184 lb (83.462 kg)   Body mass index is 27.98 kg/(m^2).  General: He is smiling and in good spirits Skin: No rash Lungs: Clear Cor: Regular S1 and S2 with no murmurs Joints and extremities: No unusual swelling, redness, warmth or pain in his left knee or ankle  Lab Results SED RATE (mm/hr)  Date Value  06/19/2015 34*  06/18/2015 25*  06/14/2014 10   CRP (mg/dL)  Date Value  06/19/2015 13.3*   Sedimentation rate 07/08/2015: 17   Problem List Items Addressed  This Visit      High   Septic arthritis of knee, left - Primary    He has had dramatic clinical improvement and his inflammatory marker has returned to normal. I suspect that any infection has now been cured. I will stop his antibiotics and have his PICC removed. He was given an influenza vaccine today. He will return to see me in 6 weeks.          Michel Bickers, MD [facility] [phone] pager   [phone] cell 07/11/2015, 10:09 AM

## 2015-07-20 ENCOUNTER — Encounter (HOSPITAL_COMMUNITY)
Admission: EM | Disposition: A | Discharge status: Home Health | Payer: Self-pay | Source: Home / Self Care | Attending: Orthopedic Surgery

## 2015-07-20 ENCOUNTER — Inpatient Hospital Stay (HOSPITAL_COMMUNITY): Payer: Medicare Other | Admitting: Anesthesiology

## 2015-07-20 ENCOUNTER — Inpatient Hospital Stay (HOSPITAL_COMMUNITY)
Admission: EM | Admit: 2015-07-20 | Discharge: 2015-07-23 | DRG: 465 | Disposition: A | Discharge status: Home Health | Payer: Medicare Other | Attending: Orthopedic Surgery | Admitting: Orthopedic Surgery

## 2015-07-20 ENCOUNTER — Encounter (HOSPITAL_COMMUNITY): Payer: Self-pay

## 2015-07-20 DIAGNOSIS — Z880 Allergy status to penicillin: Secondary | ICD-10-CM | POA: Diagnosis not present

## 2015-07-20 DIAGNOSIS — Z79891 Long term (current) use of opiate analgesic: Secondary | ICD-10-CM | POA: Diagnosis not present

## 2015-07-20 DIAGNOSIS — Z85828 Personal history of other malignant neoplasm of skin: Secondary | ICD-10-CM | POA: Diagnosis not present

## 2015-07-20 DIAGNOSIS — B999 Unspecified infectious disease: Secondary | ICD-10-CM | POA: Insufficient documentation

## 2015-07-20 DIAGNOSIS — M009 Pyogenic arthritis, unspecified: Principal | ICD-10-CM | POA: Diagnosis present

## 2015-07-20 DIAGNOSIS — Z8673 Personal history of transient ischemic attack (TIA), and cerebral infarction without residual deficits: Secondary | ICD-10-CM

## 2015-07-20 DIAGNOSIS — M25562 Pain in left knee: Secondary | ICD-10-CM | POA: Diagnosis present

## 2015-07-20 DIAGNOSIS — Z8546 Personal history of malignant neoplasm of prostate: Secondary | ICD-10-CM | POA: Diagnosis not present

## 2015-07-20 DIAGNOSIS — B9689 Other specified bacterial agents as the cause of diseases classified elsewhere: Secondary | ICD-10-CM | POA: Diagnosis not present

## 2015-07-20 DIAGNOSIS — Z87891 Personal history of nicotine dependence: Secondary | ICD-10-CM

## 2015-07-20 DIAGNOSIS — M00862 Arthritis due to other bacteria, left knee: Secondary | ICD-10-CM | POA: Diagnosis not present

## 2015-07-20 DIAGNOSIS — Z79899 Other long term (current) drug therapy: Secondary | ICD-10-CM

## 2015-07-20 DIAGNOSIS — M1712 Unilateral primary osteoarthritis, left knee: Secondary | ICD-10-CM | POA: Diagnosis present

## 2015-07-20 DIAGNOSIS — I10 Essential (primary) hypertension: Secondary | ICD-10-CM | POA: Diagnosis present

## 2015-07-20 DIAGNOSIS — Z7902 Long term (current) use of antithrombotics/antiplatelets: Secondary | ICD-10-CM | POA: Diagnosis not present

## 2015-07-20 HISTORY — PX: KNEE ARTHROTOMY: SHX5881

## 2015-07-20 LAB — CBC WITH DIFFERENTIAL/PLATELET
Basophils Absolute: 0.1 10*3/uL (ref 0.0–0.1)
Basophils Relative: 0 %
Eosinophils Absolute: 0.2 10*3/uL (ref 0.0–0.7)
Eosinophils Relative: 1 %
HCT: 45.8 % (ref 39.0–52.0)
Hemoglobin: 15.6 g/dL (ref 13.0–17.0)
Lymphocytes Relative: 17 %
Lymphs Abs: 2.2 10*3/uL (ref 0.7–4.0)
MCH: 31.2 pg (ref 26.0–34.0)
MCHC: 34.1 g/dL (ref 30.0–36.0)
MCV: 91.6 fL (ref 78.0–100.0)
Monocytes Absolute: 1.4 10*3/uL — ABNORMAL HIGH (ref 0.1–1.0)
Monocytes Relative: 11 %
Neutro Abs: 9 10*3/uL — ABNORMAL HIGH (ref 1.7–7.7)
Neutrophils Relative %: 71 %
Platelets: 205 10*3/uL (ref 150–400)
RBC: 5 MIL/uL (ref 4.22–5.81)
RDW: 14.1 % (ref 11.5–15.5)
WBC: 12.8 10*3/uL — ABNORMAL HIGH (ref 4.0–10.5)

## 2015-07-20 LAB — BASIC METABOLIC PANEL
Anion gap: 8 (ref 5–15)
BUN: 24 mg/dL — ABNORMAL HIGH (ref 6–20)
CO2: 24 mmol/L (ref 22–32)
Calcium: 10 mg/dL (ref 8.9–10.3)
Chloride: 103 mmol/L (ref 101–111)
Creatinine, Ser: 0.86 mg/dL (ref 0.61–1.24)
GFR calc Af Amer: >60 mL/min (ref >60)
GFR calc non Af Amer: >60 mL/min (ref >60)
Glucose, Bld: 108 mg/dL — ABNORMAL HIGH (ref 65–99)
Potassium: 4.3 mmol/L (ref 3.5–5.1)
Sodium: 135 mmol/L (ref 135–145)

## 2015-07-20 LAB — SYNOVIAL CELL COUNT + DIFF, W/ CRYSTALS
Crystals, Fluid: NONE SEEN
Eosinophils-Synovial: 0 % (ref 0–1)
Lymphocytes-Synovial Fld: 11 % (ref 0–20)
Monocyte-Macrophage-Synovial Fluid: 2 % — ABNORMAL LOW (ref 50–90)
Neutrophil, Synovial: 87 % — ABNORMAL HIGH (ref 0–25)
WBC, Synovial: UNDETERMINED /mm3 (ref 0–200)

## 2015-07-20 LAB — SEDIMENTATION RATE: Sed Rate: 11 mm/hr (ref 0–16)

## 2015-07-20 LAB — C-REACTIVE PROTEIN: CRP: 7.1 mg/dL — ABNORMAL HIGH (ref <1.0)

## 2015-07-20 SURGERY — ARTHROTOMY, KNEE
Anesthesia: General | Laterality: Left

## 2015-07-20 MED ORDER — FENTANYL CITRATE (PF) 100 MCG/2ML IJ SOLN
INTRAMUSCULAR | Status: AC
  Filled 2015-07-20: qty 4

## 2015-07-20 MED ORDER — POTASSIUM CHLORIDE 2 MEQ/ML IV SOLN
INTRAVENOUS | Status: DC
  Administered 2015-07-21 – 2015-07-23 (×3): via INTRAVENOUS
  Filled 2015-07-20 (×7): qty 1000

## 2015-07-20 MED ORDER — HYDROMORPHONE HCL 1 MG/ML IJ SOLN
0.5000 mg | INTRAMUSCULAR | Status: AC | PRN
  Filled 2015-07-20: qty 1

## 2015-07-20 MED ORDER — ONDANSETRON HCL 4 MG PO TABS
4.0000 mg | ORAL_TABLET | Freq: Four times a day (QID) | ORAL | Status: DC | PRN
Start: 2015-07-20 — End: 2015-07-23

## 2015-07-20 MED ORDER — VANCOMYCIN HCL IN DEXTROSE 1-5 GM/200ML-% IV SOLN
1000.0000 mg | Freq: Two times a day (BID) | INTRAVENOUS | Status: DC
  Administered 2015-07-20 – 2015-07-22 (×4): 1000 mg via INTRAVENOUS
  Filled 2015-07-20 (×5): qty 200

## 2015-07-20 MED ORDER — METHOCARBAMOL 1000 MG/10ML IJ SOLN
500.0000 mg | Freq: Four times a day (QID) | INTRAMUSCULAR | Status: DC | PRN
  Administered 2015-07-21: 500 mg via INTRAVENOUS
  Filled 2015-07-20 (×2): qty 5

## 2015-07-20 MED ORDER — SODIUM CHLORIDE 0.9 % IV SOLN: INTRAVENOUS | Status: DC

## 2015-07-20 MED ORDER — METHOCARBAMOL 500 MG PO TABS
500.0000 mg | ORAL_TABLET | Freq: Four times a day (QID) | ORAL | Status: DC | PRN
  Administered 2015-07-21 – 2015-07-23 (×3): 500 mg via ORAL
  Filled 2015-07-20 (×3): qty 1

## 2015-07-20 MED ORDER — OLMESARTAN MEDOXOMIL-HCTZ 20-12.5 MG PO TABS: 0.5000 | ORAL_TABLET | Freq: Every day | ORAL | Status: DC

## 2015-07-20 MED ORDER — ONDANSETRON HCL 4 MG/2ML IJ SOLN
INTRAMUSCULAR | Status: DC | PRN
  Administered 2015-07-20: 4 mg via INTRAVENOUS

## 2015-07-20 MED ORDER — VANCOMYCIN HCL 10 G IV SOLR
1500.0000 mg | Freq: Once | INTRAVENOUS | Status: DC
  Filled 2015-07-20: qty 1500

## 2015-07-20 MED ORDER — HYDROCHLOROTHIAZIDE 10 MG/ML ORAL SUSPENSION
6.2500 mg | Freq: Every day | ORAL | Status: DC
  Administered 2015-07-21 – 2015-07-23 (×3): 6.25 mg via ORAL
  Filled 2015-07-20 (×4): qty 1.25

## 2015-07-20 MED ORDER — ACETAMINOPHEN 325 MG PO TABS
650.0000 mg | ORAL_TABLET | Freq: Four times a day (QID) | ORAL | Status: DC | PRN
  Administered 2015-07-21 – 2015-07-22 (×4): 650 mg via ORAL
  Filled 2015-07-20 (×4): qty 2

## 2015-07-20 MED ORDER — VANCOMYCIN HCL 1000 MG IV SOLR
1000.0000 mg | Freq: Two times a day (BID) | INTRAVENOUS | Status: DC
  Administered 2015-07-20: 1000 mg via INTRAVENOUS

## 2015-07-20 MED ORDER — CLOPIDOGREL BISULFATE 75 MG PO TABS
75.0000 mg | ORAL_TABLET | Freq: Every day | ORAL | Status: DC
  Administered 2015-07-21 – 2015-07-23 (×3): 75 mg via ORAL
  Filled 2015-07-20 (×4): qty 1

## 2015-07-20 MED ORDER — PROPOFOL 10 MG/ML IV BOLUS
INTRAVENOUS | Status: AC
  Filled 2015-07-20: qty 20

## 2015-07-20 MED ORDER — IRBESARTAN 75 MG PO TABS
75.0000 mg | ORAL_TABLET | Freq: Every day | ORAL | Status: DC
  Administered 2015-07-21 – 2015-07-23 (×3): 75 mg via ORAL
  Filled 2015-07-20 (×4): qty 1

## 2015-07-20 MED ORDER — LIDOCAINE HCL (CARDIAC) 20 MG/ML IV SOLN
INTRAVENOUS | Status: DC | PRN
  Administered 2015-07-20: 60 mg via INTRAVENOUS

## 2015-07-20 MED ORDER — MORPHINE SULFATE (PF) 4 MG/ML IV SOLN
4.0000 mg | INTRAVENOUS | Status: AC | PRN
Start: 2015-07-20 — End: 2015-07-20
  Administered 2015-07-20 (×2): 4 mg via INTRAVENOUS
  Filled 2015-07-20 (×2): qty 1

## 2015-07-20 MED ORDER — FENTANYL CITRATE (PF) 100 MCG/2ML IJ SOLN
INTRAMUSCULAR | Status: AC
  Filled 2015-07-20: qty 2

## 2015-07-20 MED ORDER — POLYETHYLENE GLYCOL 3350 17 G PO PACK: 17.0000 g | PACK | Freq: Every day | ORAL | Status: DC | PRN

## 2015-07-20 MED ORDER — ONDANSETRON HCL 4 MG/2ML IJ SOLN
INTRAMUSCULAR | Status: AC
  Filled 2015-07-20: qty 2

## 2015-07-20 MED ORDER — ONDANSETRON HCL 4 MG/2ML IJ SOLN: 4.0000 mg | Freq: Four times a day (QID) | INTRAMUSCULAR | Status: DC | PRN

## 2015-07-20 MED ORDER — PHENYLEPHRINE HCL 10 MG/ML IJ SOLN
INTRAMUSCULAR | Status: DC | PRN
  Administered 2015-07-20 (×2): 80 ug via INTRAVENOUS

## 2015-07-20 MED ORDER — PROPOFOL 10 MG/ML IV BOLUS
INTRAVENOUS | Status: DC | PRN
  Administered 2015-07-20: 150 mg via INTRAVENOUS

## 2015-07-20 MED ORDER — HYDROMORPHONE HCL 1 MG/ML IJ SOLN
0.5000 mg | INTRAMUSCULAR | Status: DC | PRN
  Administered 2015-07-20 – 2015-07-21 (×4): 1 mg via INTRAVENOUS
  Administered 2015-07-21: 0.5 mg via INTRAVENOUS
  Administered 2015-07-22: 1 mg via INTRAVENOUS
  Filled 2015-07-20 (×5): qty 1

## 2015-07-20 MED ORDER — ONDANSETRON HCL 4 MG/2ML IJ SOLN: 4.0000 mg | Freq: Three times a day (TID) | INTRAMUSCULAR | Status: AC | PRN

## 2015-07-20 MED ORDER — LEVOFLOXACIN IN D5W 750 MG/150ML IV SOLN: 750.0000 mg | INTRAVENOUS | Status: DC

## 2015-07-20 MED ORDER — OXYCODONE HCL 5 MG PO TABS
5.0000 mg | ORAL_TABLET | ORAL | Status: DC | PRN
  Administered 2015-07-21 – 2015-07-23 (×11): 10 mg via ORAL
  Filled 2015-07-20 (×11): qty 2

## 2015-07-20 MED ORDER — METOCLOPRAMIDE HCL 10 MG PO TABS: 5.0000 mg | ORAL_TABLET | Freq: Three times a day (TID) | ORAL | Status: DC | PRN

## 2015-07-20 MED ORDER — SODIUM CHLORIDE 0.9 % IR SOLN
Status: DC | PRN
  Administered 2015-07-20 (×2): 3000 mL

## 2015-07-20 MED ORDER — DOCUSATE SODIUM 100 MG PO CAPS
100.0000 mg | ORAL_CAPSULE | Freq: Two times a day (BID) | ORAL | Status: DC
  Administered 2015-07-21 – 2015-07-23 (×5): 100 mg via ORAL

## 2015-07-20 MED ORDER — METOCLOPRAMIDE HCL 5 MG/ML IJ SOLN: 5.0000 mg | Freq: Three times a day (TID) | INTRAMUSCULAR | Status: DC | PRN

## 2015-07-20 MED ORDER — LACTATED RINGERS IV SOLN
INTRAVENOUS | Status: DC | PRN
  Administered 2015-07-20: 21:00:00 via INTRAVENOUS

## 2015-07-20 MED ORDER — LIDOCAINE HCL (PF) 1 % IJ SOLN
5.0000 mL | Freq: Once | INTRAMUSCULAR | Status: AC
  Administered 2015-07-20: 5 mL
  Filled 2015-07-20: qty 5

## 2015-07-20 MED ORDER — FENTANYL CITRATE (PF) 100 MCG/2ML IJ SOLN
25.0000 ug | INTRAMUSCULAR | Status: DC | PRN
  Administered 2015-07-20 (×2): 50 ug via INTRAVENOUS

## 2015-07-20 MED ORDER — LIDOCAINE HCL (CARDIAC) 20 MG/ML IV SOLN
INTRAVENOUS | Status: AC
  Filled 2015-07-20: qty 5

## 2015-07-20 MED ORDER — PHENYLEPHRINE 40 MCG/ML (10ML) SYRINGE FOR IV PUSH (FOR BLOOD PRESSURE SUPPORT)
PREFILLED_SYRINGE | INTRAVENOUS | Status: AC
  Filled 2015-07-20: qty 10

## 2015-07-20 MED ORDER — VANCOMYCIN HCL IN DEXTROSE 1-5 GM/200ML-% IV SOLN
INTRAVENOUS | Status: AC
  Filled 2015-07-20: qty 200

## 2015-07-20 MED ORDER — ACETAMINOPHEN 650 MG RE SUPP: 650.0000 mg | Freq: Four times a day (QID) | RECTAL | Status: DC | PRN

## 2015-07-20 MED ORDER — ARTIFICIAL TEARS OP OINT
TOPICAL_OINTMENT | OPHTHALMIC | Status: AC
  Filled 2015-07-20: qty 3.5

## 2015-07-20 MED ORDER — FENTANYL CITRATE (PF) 250 MCG/5ML IJ SOLN
INTRAMUSCULAR | Status: DC | PRN
  Administered 2015-07-20 (×5): 25 ug via INTRAVENOUS

## 2015-07-20 SURGICAL SUPPLY — 40 items
BAG SPEC THK2 15X12 ZIP CLS (MISCELLANEOUS) ×1
BAG ZIPLOCK 12X15 (MISCELLANEOUS) ×3 IMPLANT
BANDAGE ELASTIC 6 VELCRO ST LF (GAUZE/BANDAGES/DRESSINGS) ×2 IMPLANT
BANDAGE ESMARK 6X9 LF (GAUZE/BANDAGES/DRESSINGS) ×1 IMPLANT
BNDG CMPR 9X6 STRL LF SNTH (GAUZE/BANDAGES/DRESSINGS) ×1
BNDG ESMARK 6X9 LF (GAUZE/BANDAGES/DRESSINGS) ×3
BNDG GAUZE ELAST 4 BULKY (GAUZE/BANDAGES/DRESSINGS) ×3 IMPLANT
CUFF TOURN SGL QUICK 18 (TOURNIQUET CUFF) ×1 IMPLANT
CUFF TOURN SGL QUICK 24 (TOURNIQUET CUFF)
CUFF TOURN SGL QUICK 34 (TOURNIQUET CUFF) ×3
CUFF TRNQT CYL 24X4X40X1 (TOURNIQUET CUFF) ×1 IMPLANT
CUFF TRNQT CYL 34X4X40X1 (TOURNIQUET CUFF) ×1 IMPLANT
DRAIN PENROSE 18X1/2 LTX STRL (DRAIN) ×3 IMPLANT
DRSG PAD ABDOMINAL 8X10 ST (GAUZE/BANDAGES/DRESSINGS) ×3 IMPLANT
DURAPREP 26ML APPLICATOR (WOUND CARE) ×3 IMPLANT
ELECT REM PT RETURN 9FT ADLT (ELECTROSURGICAL) ×3
ELECTRODE REM PT RTRN 9FT ADLT (ELECTROSURGICAL) ×1 IMPLANT
GAUZE SPONGE 4X4 12PLY STRL (GAUZE/BANDAGES/DRESSINGS) ×3 IMPLANT
GAUZE XEROFORM 5X9 LF (GAUZE/BANDAGES/DRESSINGS) ×3 IMPLANT
GLOVE BIOGEL PI IND STRL 7.5 (GLOVE) ×1 IMPLANT
GLOVE BIOGEL PI IND STRL 8.5 (GLOVE) ×1 IMPLANT
GLOVE BIOGEL PI INDICATOR 7.5 (GLOVE) ×2
GLOVE BIOGEL PI INDICATOR 8.5 (GLOVE) ×2
GLOVE ECLIPSE 8.0 STRL XLNG CF (GLOVE) IMPLANT
GLOVE ORTHO TXT STRL SZ7.5 (GLOVE) ×6 IMPLANT
GLOVE SURG ORTHO 8.0 STRL STRW (GLOVE) ×3 IMPLANT
GOWN SPEC L3 XXLG W/TWL (GOWN DISPOSABLE) ×6 IMPLANT
GOWN STRL REUS W/TWL LRG LVL3 (GOWN DISPOSABLE) ×3 IMPLANT
HANDPIECE INTERPULSE COAX TIP (DISPOSABLE) ×3
KIT BASIN OR (CUSTOM PROCEDURE TRAY) ×3 IMPLANT
MANIFOLD NEPTUNE II (INSTRUMENTS) ×3 IMPLANT
PACK ORTHO EXTREMITY (CUSTOM PROCEDURE TRAY) ×3 IMPLANT
PAD ABD 8X10 STRL (GAUZE/BANDAGES/DRESSINGS) ×2 IMPLANT
PAD CAST 4YDX4 CTTN HI CHSV (CAST SUPPLIES) ×1 IMPLANT
PADDING CAST COTTON 4X4 STRL (CAST SUPPLIES)
POSITIONER SURGICAL ARM (MISCELLANEOUS) ×3 IMPLANT
SET HNDPC FAN SPRY TIP SCT (DISPOSABLE) ×1 IMPLANT
SOL PREP PROV IODINE SCRUB 4OZ (MISCELLANEOUS) ×3 IMPLANT
SYR CONTROL 10ML LL (SYRINGE) ×3 IMPLANT
TOWEL OR 17X26 10 PK STRL BLUE (TOWEL DISPOSABLE) ×6 IMPLANT

## 2015-07-20 NOTE — ED Notes (Signed)
Lab called and reported gram stain with WBC's with polymorphone nuclear. MD aware.

## 2015-07-20 NOTE — ED Notes (Signed)
He states he had "an infection cleaned out of my left knee" in August of this year by Dr. Rol[facility] ortho.).  He states he has been on IV antibiotics until quite recently; also recently his P.I.C.C. Line was removed.  He is also being followed by I.D.  He c/o increased papin in left knee past couple of days.  The I.D. Doctor on call advised him to come here for exam by [facility] ortho. Physician.  He arrives with knee immobilizer, which I do not disturb.  He ambulates with assist of front-wheeled walker.

## 2015-07-20 NOTE — Progress Notes (Signed)
       [facility] for Infectious Disease     I ADVISED PT TO COME TO ED FOR RE-EVALUATION OF APPARENT RECURRENT SEPTIC KNEE  SINCE WE DID NOT ISOLATE AN ORGANISM THE FIRST TIME HE HAD SURGERY I WOULD LIKE VERY MUCH TO   NOT CONFUSE THE PICTURE BY GIVING HIM ANTIBIOTICS PRIOR TO HIS HAVING A KNEE ASPIRATE PERFORMED FOR   CELL COUNT DIFFERENTIAL  GRAM STAIN AND CULTURE  THERE IS NO URGENT INDICATION FOR ANTIBIOTICS FOR THIS PT  I HAVE DISCONTINUED THEM AND HOPEFULLY THEY HAVE NOT BEEN GIVEN   PLEASE CALL WITH QUESTIONS ABOUT THE ABX AND PLEASE DO NOT START ANTIBIOTICS UNTIL WE HAVE A DEEP CULTURE TO GUIDE FURTHER THERAPY

## 2015-07-20 NOTE — ED Notes (Signed)
Awake. Verbally responsive. A/O x4. Resp even and unlabored. No audible adventitious breath sounds noted. ABC's intact. IV saline lock patent and intact. 

## 2015-07-20 NOTE — ED Provider Notes (Signed)
CSN: [id number]     Arrival date & time 07/20/15  74 History   First MD Initiated Contact with Patient 07/20/15 1457     Chief Complaint  Patient presents with  . Knee Pain     (Consider location/radiation/quality/duration/timing/severity/associated sxs/prior Treatment) Patient is a 69 y.o. male presenting with knee pain.  Knee Pain    69 year old male with left knee pain. Patient has a past history significant for septic left knee. He had arthroscopy/irrigation by orthopedics on September 1 for the same. He completed 3 weeks of vancomycin and Levaquin approximately one week ago. Had been doing quite well until he began having increasing pain again yesterday. Redness. Worse with movement.  Feel similar to symptoms he was having previously. No history of gout. Subjective fever. Temperature taken at home and 99.6. Had tylenol around 1230. Has been ambulating but doesn't feel like he has increase his activity level has increased significantly over the last few days.   Past Medical History  Diagnosis Date  . Hypertension   . Abdominal hernia     "in there now" (05/20/2015)  . TIA (transient ischemic attack) 2004  . Arthritis     "terrible; all over" (05/20/2015)  . Prostate cancer ([place])   . Basal cell carcinoma     "left neck"  . Epistaxis 05/20/2015    hospitalized   Past Surgical History  Procedure Laterality Date  . Video assisted thoracoscopy (vats)/wedge resection Right 10/2009    "upper; benign"  . Prostatectomy  01/2010  . Cystoscopy w/ stone manipulation  1994    "got one stone; still has one lodge in there" (05/20/2015)  . Bladder stone removal  09/2009  . Basal cell carcinoma excision Left 2015    "neck"  . Nasal endoscopy with epistaxis control Left 05/21/2015    Procedure: NASAL ENDOSCOPY WITH EPISTAXIS CONTROL;  Surgeon: Melissa Montane, MD;  Location: [facility];  Service: ENT;  Laterality: Left;  . Incision and drainage abscess Left 06/20/2015    Procedure: INCISION AND DRAINAGE  ABSCESS;  Surgeon: Melina Schools, MD;  Location: [facility];  Service: Orthopedics;  Laterality: Left;   No family history on file. Social History  Substance Use Topics  . Smoking status: Former Smoker -- 1.00 packs/day for 4 years    Types: Cigarettes    Quit date: 08/29/2008  . Smokeless tobacco: Never Used  . Alcohol Use: No    Review of Systems  All systems reviewed and negative, other than as noted in HPI.   Allergies  Other and Penicillins  Home Medications   Prior to Admission medications   Medication Sig Start Date End Date Taking? Authorizing Provider  clopidogrel (PLAVIX) 75 MG tablet TAKE ONE TABLET BY MOUTH ONCE DAILY 04/08/15  Yes Mary-Margaret Hassell Done, FNP  olmesartan-hydrochlorothiazide (BENICAR HCT) 20-12.5 MG per tablet Take 1 tablet by mouth daily. Patient taking differently: Take 0.5 tablets by mouth daily.  05/13/15  Yes Mary-Margaret Hassell Done, FNP  oxyCODONE (OXY IR/ROXICODONE) 5 MG immediate release tablet Take 1-2 tablets (5-10 mg total) by mouth every 4 (four) hours as needed for severe pain. 06/21/15  Yes Cecilie Kicks, PA-C  colchicine 0.6 MG tablet Take 1 tablet (0.6 mg total) by mouth 2 (two) times daily. Patient not taking: Reported on 07/20/2015 06/26/15   Susa Day, MD  docusate sodium (COLACE) 100 MG capsule Take 1 capsule (100 mg total) by mouth 2 (two) times daily. Patient not taking: Reported on 07/20/2015 06/21/15   Cecilie Kicks, PA-C  lansoprazole (PREVACID) 30 MG capsule Take 1 capsule (30 mg total) by mouth daily at 12 noon. Patient not taking: Reported on 06/18/2015 05/13/15   Mary-Margaret Hassell Done, FNP   BP 125/83 mmHg  Pulse 108  Temp(Src) 98.2 F (36.8 C) (Oral)  Resp 16  SpO2 95% Physical Exam  Constitutional: He appears well-developed and well-nourished. No distress.  HENT:  Head: Normocephalic and atraumatic.  Eyes: Conjunctivae are normal. Right eye exhibits no discharge. Left eye exhibits no discharge.  Neck: Neck supple.   Cardiovascular: Normal rate, regular rhythm and normal heart sounds.  Exam reveals no gallop and no friction rub.   No murmur heard. Pulmonary/Chest: Effort normal and breath sounds normal. No respiratory distress.  Abdominal: Soft. He exhibits no distension. There is no tenderness.  Musculoskeletal: He exhibits edema and tenderness.  Faint erythema L knee. Small effusion. TTP anteriorly and medially. Significant pain with passive and active ROM. Prior surgical incisions appears to be healing w/o complication. NVI distally.   Neurological: He is alert.  Skin: Skin is warm and dry.  Psychiatric: He has a normal mood and affect. His behavior is normal. Thought content normal.  Nursing note and vitals reviewed.   ED Course  ARTHOCENTESIS Date/Time: 07/20/2015 4:05 PM Performed by: Virgel Manifold Authorized by: Virgel Manifold Consent: Verbal consent obtained. Risks and benefits: risks, benefits and alternatives were discussed Consent given by: patient Indications: joint swelling,  pain,  possible septic joint and diagnostic evaluation  Body area: knee Joint: left knee Local anesthesia used: yes Local anesthetic: lidocaine 1% without epinephrine Anesthetic total: 3 ml Preparation: Patient was prepped and draped in the usual sterile fashion. Needle gauge: 18 G Approach: lateral Aspirate: cloudy Aspirate amount: 15 mL Patient tolerance: Patient tolerated the procedure well with no immediate complications   (including critical care time) Labs Review Labs Reviewed  CBC WITH DIFFERENTIAL/PLATELET - Abnormal; Notable for the following:    WBC 12.8 (*)    Neutro Abs 9.0 (*)    Monocytes Absolute 1.4 (*)    All other components within normal limits  BASIC METABOLIC PANEL - Abnormal; Notable for the following:    Glucose, Bld 108 (*)    BUN 24 (*)    All other components within normal limits  SYNOVIAL CELL COUNT + DIFF, W/ CRYSTALS - Abnormal; Notable for the following:    Color,  Synovial AMBER (*)    Appearance-Synovial CLOUDY (*)    Neutrophil, Synovial 87 (*)    Monocyte-Macrophage-Synovial Fluid 2 (*)    All other components within normal limits  BODY FLUID CULTURE  CULTURE, BLOOD (ROUTINE X 2)  CULTURE, BLOOD (ROUTINE X 2)  SEDIMENTATION RATE  C-REACTIVE PROTEIN    Imaging Review No results found. I have personally reviewed and evaluated these images and lab results as part of my medical decision-making.   EKG Interpretation None      MDM   Final diagnoses:  Pyogenic arthritis of left knee joint, due to unspecified organism [facility])    69yM who I unfortunately think has septic L knee. IV placed. Pain meds. Blood cultures and inflammatory markers. Will start abx after arthrocentesis. Dr Alvan Dame about to start case at [facility]. Will call him back once cell counts return.   Athrocentesis with very cloudy grey appearing fluid. No crystals noted. Apparently couldn't get cell count from sample. Discussed with Dr Alvan Dame. Temp admission orders placed per request. Pt updated and aware that may have procedure later this evening/tonight.    Virgel Manifold, MD  07/24/15 1444 

## 2015-07-20 NOTE — Progress Notes (Signed)
ANTIBIOTIC CONSULT NOTE - INITIAL  Pharmacy Consult for Vancomycin Indication: Septic native Left knee   Patient Measurements:   Wt=83.5 kg  Vital Signs: Temp: 99.7 F (37.6 C) (10/01 2300) Temp Source: Oral (10/01 2015) BP: 141/87 mmHg (10/01 2300) Pulse Rate: 99 (10/01 2300) Intake/Output from previous day:   Intake/Output from this shift: Total I/O In: 400 [I.V.:400] Out: -   Labs:  Recent Labs  07/20/15 1525  WBC 12.8*  HGB 15.6  PLT 205  CREATININE 0.86   Estimated Creatinine Clearance: 82.9 mL/min (by C-G formula based on Cr of 0.86). No results for input(s): VANCOTROUGH, VANCOPEAK, VANCORANDOM, GENTTROUGH, GENTPEAK, GENTRANDOM, TOBRATROUGH, TOBRAPEAK, TOBRARND, AMIKACINPEAK, AMIKACINTROU, AMIKACIN in the last 72 hours.   Microbiology: Recent Results (from the past 720 hour(s))  Anaerobic culture     Status: None   Collection Time: 06/23/15  4:08 PM  Result Value Ref Range Status   Specimen Description SYNOVIAL RIGHT ANKLE  Final   Special Requests NONE  Final   Gram Stain   Final    RARE WBC PRESENT, PREDOMINANTLY PMN RARE GRAM NEGATIVE RODS RARE GRAM POSITIVE COCCI GRAM STAIN REVIEWED-AGREE WITH RESULT M VESTAL    Culture   Final    NO ANAEROBES ISOLATED Performed at [facility]    Report Status 06/29/2015 FINAL  Final  Body fluid culture     Status: None   Collection Time: 06/23/15  4:08 PM  Result Value Ref Range Status   Specimen Description FLUID SYNOVIAL ANKLE  Final   Special Requests NONE  Final   Gram Stain   Final    RARE WBC PRESENT, PREDOMINANTLY PMN RARE GRAM NEGATIVE RODS RARE GRAM POSITIVE COCCI GRAM STAIN REVIEWED-AGREE WITH RESULT M VESTAL    Culture   Final    NO GROWTH 3 DAYS Performed at [facility]    Report Status 06/27/2015 FINAL  Final  Body fluid culture     Status: None (Preliminary result)   Collection Time: 07/20/15  4:15 PM  Result Value Ref Range Status   Specimen Description KNEE LEFT  Final    Special Requests Normal  Final   Gram Stain   Final    NO ORGANISMS SEEN WBC PRESENT, PREDOMINANTLY PMN Gram Stain Report Called to,Read Back By and Verified With: B.BARRETT AT 3614 ON 07/20/15 BY S.VANHOORNE    Culture PENDING  Incomplete   Report Status PENDING  Incomplete    Medical History: Past Medical History  Diagnosis Date  . Hypertension   . Abdominal hernia     "in there now" (05/20/2015)  . TIA (transient ischemic attack) 2004  . Arthritis     "terrible; all over" (05/20/2015)  . Prostate cancer ([place])   . Basal cell carcinoma     "left neck"  . Epistaxis 05/20/2015    hospitalized    Medications:  Prescriptions prior to admission  Medication Sig Dispense Refill Last Dose  . clopidogrel (PLAVIX) 75 MG tablet TAKE ONE TABLET BY MOUTH ONCE DAILY 90 tablet 0 07/20/2015 at Unknown time  . olmesartan-hydrochlorothiazide (BENICAR HCT) 20-12.5 MG per tablet Take 1 tablet by mouth daily. (Patient taking differently: Take 0.5 tablets by mouth daily. ) 30 tablet 5 07/20/2015 at Unknown time  . oxyCODONE (OXY IR/ROXICODONE) 5 MG immediate release tablet Take 1-2 tablets (5-10 mg total) by mouth every 4 (four) hours as needed for severe pain. 40 tablet 0 07/20/2015 at Unknown time  . colchicine 0.6 MG tablet Take 1 tablet (0.6 mg  total) by mouth 2 (two) times daily. (Patient not taking: Reported on 07/20/2015) 14 tablet 1   . docusate sodium (COLACE) 100 MG capsule Take 1 capsule (100 mg total) by mouth 2 (two) times daily. (Patient not taking: Reported on 07/20/2015) 40 capsule 0   . lansoprazole (PREVACID) 30 MG capsule Take 1 capsule (30 mg total) by mouth daily at 12 noon. (Patient not taking: Reported on 06/18/2015) 30 capsule 2 05/19/2015 at Unknown time   Scheduled:  . [START ON 07/21/2015] clopidogrel  75 mg Oral Daily  . docusate sodium  100 mg Oral BID  . fentaNYL      . [START ON 07/21/2015] irbesartan  75 mg Oral Daily   And  . [START ON 07/21/2015] hydrochlorothiazide  6.25 mg  Oral Daily  . vancomycin  1,000 mg Intravenous Q12H   Infusions:  . sodium chloride    . sodium chloride 0.9 % 1,000 mL with potassium chloride 10 mEq infusion     Assessment: 10 yoM with hx of septic left knee s/p I&D.  Vancomycin per Rx for septic native left knee.  Goal of Therapy:  Vancomycin trough level 15-20 mcg/ml  Plan:   Vancomycin 1Gm IV q12h  F/u SCr/cultures/levels  Lawana Pai R 07/20/2015,11:57 PM

## 2015-07-20 NOTE — ED Notes (Signed)
Awake. Verbally responsive. A/O x4. Resp even and unlabored. No audible adventitious breath sounds noted. ABC's intact. Family at bedside. 

## 2015-07-20 NOTE — Op Note (Signed)
NAMEQUINTAN, Timothy Duran                  ACCOUNT NO.:  [id number]  MEDICAL RECORD NO.:  [id number]  LOCATION:  5409                         FACILITY:  [facility]  PHYSICIAN:  Timothy Cassis. Alvan Duran, M.D.  DATE OF BIRTH:  1946-02-04  DATE OF PROCEDURE:  07/20/2015 DATE OF DISCHARGE:                              OPERATIVE REPORT   PREOPERATIVE DIAGNOSIS:  Recurrent septic left knee.  POSTOPERATIVE DIAGNOSIS:  Recurrent septic left knee.  PROCEDURE:  Open arthrotomy of left knee with excisional debridement of about a 4-cm area of skin, subcutaneous tissue, synovium followed by nonexcisional debridement with 6 liters of normal saline solution pulse lavage.  SURGEON:  Timothy Cassis. Alvan Duran, M.D.  ASSISTANT:  Surgical team.  ANESTHESIA:  General, LMA.  SPECIMENS:  Fluid from the joint had been taken in the emergency room as aspirated culture and we did take fluid samples with culture swabs, sent for Gram stain and culture, anaerobic and aerobic.  DRAINS:  One medium Hemovac.  TOURNIQUET TIME:  22 minutes at 250 mmHg.  INDICATIONS FOR PROCEDURE:  Timothy Duran is a 69 year old male who had presented to the emergency room in September with increasing left knee pain.  Workup at that time indicated concern for septic left knee.  He underwent an arthroscopic evaluation and lavage of his knee.  He was placed on antibiotics, vancomycin and Levaquin.  He stopped these about a week ago and as the week progressed, he had increasing knee pain to the point where it was quite severe.  He was afebrile during this time. Given the inability to maintain his pain control until the office visit to follow up with his surgeon, he came to the emergency room for evaluation.  He was seen and evaluated by the emergency room physician, they were kind enough to aspirate the knee.  He reported to me that the fluid was cloudy, cell count could not be obtained based on clotting.  Based on the presentation of pain and recurrent  effusion, I elected to take him to the operating room for formal I and D through an arthrotomy; I and D of the knee and irrigated.  Risks, benefits and necessity of the procedure were discussed and reviewed given the recurrence.  Consent was obtained for benefit and improvement of infection and management.  PROCEDURE IN DETAIL:  The patient was brought to the operative theater. Once adequate anesthesia, preoperative antibiotics had been held, but started vancomycin upon the arthrotomy.  The left lower extremity was then prepped and draped in sterile fashion over a nonsterile thigh tourniquet.  Time-out was performed identifying the patient, planned procedure, and extremity.  The leg was exsanguinated.  Tourniquet elevated to 200 mmHg.  I made a midline-type incision and then made an arthrotomy around the patella slightly into the proximal aspect of the joint to allow enough exposure to get a pulse lavage tubing in that. Following the sharp excisional debridement of this 4 cm area of the synovium and soft tissues in the visible area, I then irrigated the knee with 6 liters of normal saline solution.  Upon completion of the irrigation, I placed a medium Hemovac drain in the knee and reapproximated the  arthrotomy with #1 PDS sutures.  The remainder of the wound was closed with 2-0 Vicryl and then staples on the skin.  The skin was cleaned, dried and dressed sterilely using Xeroform and a bulky dressing.  Findings were reviewed with family.  I planned to consult the Infectious Disease to help with duration of treatment based on the failure of initial treatment.  I will be in the hospital till his workout activities otherwise as tolerated, pull the drain probably on Monday morning.     Timothy Duran, M.D.     MDO/MEDQ  D:  07/20/2015  T:  07/20/2015  Job:  [id number]

## 2015-07-20 NOTE — Anesthesia Procedure Notes (Signed)
Procedure Name: LMA Insertion Date/Time: 07/20/2015 9:33 PM Performed by: Dione Booze Pre-anesthesia Checklist: Patient identified, Emergency Drugs available, Suction available and Patient being monitored Patient Re-evaluated:Patient Re-evaluated prior to inductionOxygen Delivery Method: Circle system utilized Preoxygenation: Pre-oxygenation with 100% oxygen Intubation Type: IV induction Ventilation: Mask ventilation without difficulty LMA: LMA with gastric port inserted LMA Size: 4.0 Number of attempts: 1 Placement Confirmation: breath sounds checked- equal and bilateral and positive ETCO2 Tube secured with: Tape Dental Injury: Teeth and Oropharynx as per pre-operative assessment

## 2015-07-20 NOTE — Transfer of Care (Signed)
Immediate Anesthesia Transfer of Care Note  Patient: Timothy Duran  Procedure(s) Performed: Procedure(s): KNEE ARTHROTOMY Incision and Drainage left knee (Left)  Patient Location: PACU  Anesthesia Type:General  Level of Consciousness: awake, alert , oriented and patient cooperative  Airway & Oxygen Therapy: Patient Spontanous Breathing and Patient connected to face mask oxygen  Post-op Assessment: Report given to RN and Post -op Vital signs reviewed and stable  Post vital signs: Reviewed and stable  Last Vitals:  Filed Vitals:   07/20/15 2015  BP: 121/70  Pulse: 92  Temp: 36.6 C  Resp: 16    Complications: No apparent anesthesia complications

## 2015-07-20 NOTE — Anesthesia Preprocedure Evaluation (Signed)
Anesthesia Evaluation  Patient identified by MRN, date of birth, ID band Patient awake    Reviewed: Allergy & Precautions, H&P , NPO status , Patient's Chart, lab work & pertinent test results  Airway Mallampati: II  TM Distance: >3 FB Neck ROM: full    Dental  (+) Dental Advisory Given, Caps, Edentulous Lower All upper front are capped:   Pulmonary neg pulmonary ROS, former smoker,  VATS with wedge resection   Pulmonary exam normal breath sounds clear to auscultation       Cardiovascular Exercise Tolerance: Good hypertension, Pt. on medications Normal cardiovascular exam Rhythm:regular Rate:Normal     Neuro/Psych TIAnegative neurological ROS  negative psych ROS   GI/Hepatic negative GI ROS, Neg liver ROS, GERD  Medicated and Controlled,  Endo/Other  negative endocrine ROS  Renal/GU negative Renal ROS  negative genitourinary   Musculoskeletal   Abdominal   Peds  Hematology negative hematology ROS (+)   Anesthesia Other Findings epistaxis  Reproductive/Obstetrics negative OB ROS                             Anesthesia Physical Anesthesia Plan  ASA: III  Anesthesia Plan: General   Post-op Pain Management:    Induction: Intravenous  Airway Management Planned: LMA  Additional Equipment:   Intra-op Plan:   Post-operative Plan:   Informed Consent:   Plan Discussed with: Surgeon  Anesthesia Plan Comments:         Anesthesia Quick Evaluation

## 2015-07-20 NOTE — Brief Op Note (Signed)
07/20/2015  9:24 PM  PATIENT:  [name]  69 y.o. male  PRE-OPERATIVE DIAGNOSIS:  septic arthritis left knee  POST-OPERATIVE DIAGNOSIS:  septic arthritis left knee  PROCEDURE:  Procedure(s): KNEE ARTHROTOMY (Left)  Excisional and non-excisional debridement left knee  SURGEON:  Surgeon(s) and Role:    * Paralee Cancel, MD - Primary  PHYSICIAN ASSISTANT: None  ANESTHESIA:   general  EBL:     BLOOD ADMINISTERED:none  DRAINS: (1 medium) Hemovact drain(s) in the left knee with  Suction Open   LOCAL MEDICATIONS USED:  NONE  SPECIMEN:  Source of Specimen:  left knee synovial fluid  DISPOSITION OF SPECIMEN:  PATHOLOGY  COUNTS:  YES  TOURNIQUET:  22 min @ 200 mmHg  DICTATION: .Other Dictation: Dictation Number [id number]  PLAN OF CARE: Admit to inpatient   PATIENT DISPOSITION:  PACU - hemodynamically stable.   Delay start of Pharmacological VTE agent (>24hrs) due to surgical blood loss or risk of bleeding: no

## 2015-07-20 NOTE — H&P (Signed)
Timothy Duran is an 69 y.o. male.    Chief Complaint:  Recurrent left knee pain   HPI: Pt is a 69 y.o. male with history of septic left knee.  He underwent an arthroscopic I&D of the left knee 9/1 by Dr. Rolena Infante.  He has recently, this past week, completed course of IV Vancomycin and PO levaquin.  He began developing recurrent left knee pain, swelling without fever.  After speaking with him over the phone today he did not feel he could wait until next week to return to office and presented to the [facility] ER this evening for evaluation.  Aspiration by ER physician revealed cloudy fluid by cell count could not be performed.  Ortho was consulted for recurrent infection left knee.  PCP:  Chevis Pretty, FNP  D/C Plans: To be determined following appropriate treatment plan  PMH: Past Medical History  Diagnosis Date  . Hypertension   . Abdominal hernia     "in there now" (05/20/2015)  . TIA (transient ischemic attack) 2004  . Arthritis     "terrible; all over" (05/20/2015)  . Prostate cancer ([place])   . Basal cell carcinoma     "left neck"  . Epistaxis 05/20/2015    hospitalized    PSH: Past Surgical History  Procedure Laterality Date  . Video assisted thoracoscopy (vats)/wedge resection Right 10/2009    "upper; benign"  . Prostatectomy  01/2010  . Cystoscopy w/ stone manipulation  1994    "got one stone; still has one lodge in there" (05/20/2015)  . Bladder stone removal  09/2009  . Basal cell carcinoma excision Left 2015    "neck"  . Nasal endoscopy with epistaxis control Left 05/21/2015    Procedure: NASAL ENDOSCOPY WITH EPISTAXIS CONTROL;  Surgeon: Melissa Montane, MD;  Location: [place];  Service: ENT;  Laterality: Left;  . Incision and drainage abscess Left 06/20/2015    Procedure: INCISION AND DRAINAGE ABSCESS;  Surgeon: Melina Schools, MD;  Location: [facility];  Service: Orthopedics;  Laterality: Left;    Social History:  reports that he quit smoking about 6 years ago. His smoking use included  Cigarettes. He has a 4 pack-year smoking history. He has never used smokeless tobacco. He reports that he does not drink alcohol or use illicit drugs.  Allergies:  Allergies  Allergen Reactions  . Other     Nasal packing- headaches and facial pain  . Penicillins     Blisters  Has patient had a PCN reaction causing immediate rash, facial/tongue/throat swelling, SOB or lightheadedness with hypotension: unknown Has patient had a PCN reaction causing severe rash involving mucus membranes or skin necrosis: unknown Has patient had a PCN reaction that required hospitalization No Has patient had a PCN reaction occurring within the last 10 years: no  If all of the above answers are "NO", then may proceed with Cephalosporin use.    Medications: Medications Prior to Admission  Medication Sig Dispense Refill  . clopidogrel (PLAVIX) 75 MG tablet TAKE ONE TABLET BY MOUTH ONCE DAILY 90 tablet 0  . olmesartan-hydrochlorothiazide (BENICAR HCT) 20-12.5 MG per tablet Take 1 tablet by mouth daily. (Patient taking differently: Take 0.5 tablets by mouth daily. ) 30 tablet 5  . oxyCODONE (OXY IR/ROXICODONE) 5 MG immediate release tablet Take 1-2 tablets (5-10 mg total) by mouth every 4 (four) hours as needed for severe pain. 40 tablet 0  . colchicine 0.6 MG tablet Take 1 tablet (0.6 mg total) by mouth 2 (two) times daily. (Patient  not taking: Reported on 07/20/2015) 14 tablet 1  . docusate sodium (COLACE) 100 MG capsule Take 1 capsule (100 mg total) by mouth 2 (two) times daily. (Patient not taking: Reported on 07/20/2015) 40 capsule 0  . lansoprazole (PREVACID) 30 MG capsule Take 1 capsule (30 mg total) by mouth daily at 12 noon. (Patient not taking: Reported on 06/18/2015) 30 capsule 2    Results for orders placed or performed during the hospital encounter of 07/20/15 (from the past 48 hour(s))  CBC with Differential     Status: Abnormal   Collection Time: 07/20/15  3:25 PM  Result Value Ref Range   WBC 12.8  (H) 4.0 - 10.5 K/uL   RBC 5.00 4.22 - 5.81 MIL/uL   Hemoglobin 15.6 13.0 - 17.0 g/dL   HCT 45.8 39.0 - 52.0 %   MCV 91.6 78.0 - 100.0 fL   MCH 31.2 26.0 - 34.0 pg   MCHC 34.1 30.0 - 36.0 g/dL   RDW 14.1 11.5 - 15.5 %   Platelets 205 150 - 400 K/uL   Neutrophils Relative % 71 %   Neutro Abs 9.0 (H) 1.7 - 7.7 K/uL   Lymphocytes Relative 17 %   Lymphs Abs 2.2 0.7 - 4.0 K/uL   Monocytes Relative 11 %   Monocytes Absolute 1.4 (H) 0.1 - 1.0 K/uL   Eosinophils Relative 1 %   Eosinophils Absolute 0.2 0.0 - 0.7 K/uL   Basophils Relative 0 %   Basophils Absolute 0.1 0.0 - 0.1 K/uL  Basic metabolic panel     Status: Abnormal   Collection Time: 07/20/15  3:25 PM  Result Value Ref Range   Sodium 135 135 - 145 mmol/L   Potassium 4.3 3.5 - 5.1 mmol/L   Chloride 103 101 - 111 mmol/L   CO2 24 22 - 32 mmol/L   Glucose, Bld 108 (H) 65 - 99 mg/dL   BUN 24 (H) 6 - 20 mg/dL   Creatinine, Ser 0.86 0.61 - 1.24 mg/dL   Calcium 10.0 8.9 - 10.3 mg/dL   GFR calc non Af Amer >60 >60 mL/min   GFR calc Af Amer >60 >60 mL/min    Comment: (NOTE) The eGFR has been calculated using the CKD EPI equation. This calculation has not been validated in all clinical situations. eGFR's persistently <60 mL/min signify possible Chronic Kidney Disease.    Anion gap 8 5 - 15  C-reactive protein     Status: Abnormal   Collection Time: 07/20/15  3:25 PM  Result Value Ref Range   CRP 7.1 (H) <1.0 mg/dL    Comment: Performed at [facility]  Sedimentation rate     Status: None   Collection Time: 07/20/15  3:25 PM  Result Value Ref Range   Sed Rate 11 0 - 16 mm/hr  Synovial cell count + diff, w/ crystals     Status: Abnormal   Collection Time: 07/20/15  4:15 PM  Result Value Ref Range   Color, Synovial AMBER (A) YELLOW   Appearance-Synovial CLOUDY (A) CLEAR   Crystals, Fluid NO CRYSTALS SEEN    WBC, Synovial UNABLE TO PERFORM COUNT DUE TO CLOT IN SPECIMEN 0 - 200 /cu mm   Neutrophil, Synovial 87 (H) 0 - 25  %   Lymphocytes-Synovial Fld 11 0 - 20 %   Monocyte-Macrophage-Synovial Fluid 2 (L) 50 - 90 %   Eosinophils-Synovial 0 0 - 1 %  Body fluid culture     Status: None (Preliminary result)  Collection Time: 07/20/15  4:15 PM  Result Value Ref Range   Specimen Description KNEE LEFT    Special Requests Normal    Gram Stain      NO ORGANISMS SEEN WBC PRESENT, PREDOMINANTLY PMN Gram Stain Report Called to,Read Back By and Verified With: B.BARRETT AT 5170 ON 07/20/15 BY S.VANHOORNE    Culture PENDING    Report Status PENDING    No results found.  ROS: Review of Systems - Negative except that taken from recent history  Physican Exam: Blood pressure 121/70, pulse 92, temperature 97.8 F (36.6 C), temperature source Oral, resp. rate 16, SpO2 94 %. .physicalexam  Awake alert in room with family around bedside Lungs clear Heart normal  He does not appear to be sick Left knee with persistent effusion despite aspiration in ER Pain with movement left knee  Otherwise NVI  Right knee normal  Assessment/Plan Assessment: Recurrent septic left knee    Plan: Based on history of prior arthroscopic I&D I am going to take him back to the OR this evening for a open arthrotomy, excisional and non-excisional debridement  NPO since 10 am Started back on Vanc and levoquin for now Will likely consult ID for treatment recs  D/C pending stabilization of knee and treatment plan per ID   Pietro Cassis. Alvan Dame, MD  07/20/2015, 8:39 PM

## 2015-07-20 NOTE — ED Notes (Addendum)
Pt reported being sent here from MD's office with lt knee pain. Lt knee is slightly swollen, redness and warmth to touch. Pt reported having low grade temp at home of 99.6. Pt took Tylenol 1500mg  po at 1230. Noted small area to lt lateral knee without drainage noted.  (+)PMS, CRT brisk, no deformity/bruising noted. No deformity. LROM. Pt denies injury. Recent knee sx on 06/19/2015. Pt reported having increase pain with ambulation. Complete ABT on 9/22.

## 2015-07-21 ENCOUNTER — Encounter (HOSPITAL_COMMUNITY): Payer: Self-pay | Admitting: *Deleted

## 2015-07-21 DIAGNOSIS — M009 Pyogenic arthritis, unspecified: Secondary | ICD-10-CM | POA: Insufficient documentation

## 2015-07-21 DIAGNOSIS — B9689 Other specified bacterial agents as the cause of diseases classified elsewhere: Secondary | ICD-10-CM

## 2015-07-21 DIAGNOSIS — M00862 Arthritis due to other bacteria, left knee: Secondary | ICD-10-CM

## 2015-07-21 DIAGNOSIS — B999 Unspecified infectious disease: Secondary | ICD-10-CM | POA: Insufficient documentation

## 2015-07-21 LAB — BASIC METABOLIC PANEL
Anion gap: 5 (ref 5–15)
BUN: 21 mg/dL — AB (ref 6–20)
CHLORIDE: 103 mmol/L (ref 101–111)
CO2: 26 mmol/L (ref 22–32)
CREATININE: 0.87 mg/dL (ref 0.61–1.24)
Calcium: 9.8 mg/dL (ref 8.9–10.3)
GFR calc Af Amer: >60 mL/min (ref >60)
GFR calc non Af Amer: >60 mL/min (ref >60)
Glucose, Bld: 120 mg/dL — ABNORMAL HIGH (ref 65–99)
Potassium: 4.7 mmol/L (ref 3.5–5.1)
SODIUM: 134 mmol/L — AB (ref 135–145)

## 2015-07-21 LAB — CBC
HCT: 43.6 % (ref 39.0–52.0)
Hemoglobin: 14.3 g/dL (ref 13.0–17.0)
MCH: 30.4 pg (ref 26.0–34.0)
MCHC: 32.8 g/dL (ref 30.0–36.0)
MCV: 92.8 fL (ref 78.0–100.0)
PLATELETS: 190 10*3/uL (ref 150–400)
RBC: 4.7 MIL/uL (ref 4.22–5.81)
RDW: 14.1 % (ref 11.5–15.5)
WBC: 11.8 10*3/uL — AB (ref 4.0–10.5)

## 2015-07-21 NOTE — Progress Notes (Signed)
Timothy Duran  MRN: [id number] DOB/Age: Jan 13, 1946 69 y.o. Physician: Rada Hay Procedure: Procedure(s) (LRB): KNEE ARTHROTOMY Incision and Drainage left knee (Left)     Subjective: Awake and alert. Pain controlled currently.   Vital Signs Temp:  [97.8 F (36.6 C)-99.9 F (37.7 C)] 98.3 F (36.8 C) (10/02 0539) Pulse Rate:  [89-108] 97 (10/02 0539) Resp:  [12-20] 15 (10/02 0539) BP: (121-156)/(70-91) 131/81 mmHg (10/02 0539) SpO2:  [90 %-100 %] 97 % (10/02 0539) Weight:  [83.462 kg (184 lb)] 83.462 kg (184 lb) (10/01 2015)  Lab Results  Recent Labs  07/20/15 1525 07/21/15 0516  WBC 12.8* 11.8*  HGB 15.6 14.3  HCT 45.8 43.6  PLT 205 190   BMET  Recent Labs  07/20/15 1525 07/21/15 0516  NA 135 134*  K 4.3 4.7  CL 103 103  CO2 24 26  GLUCOSE 108* 120*  BUN 24* 21*  CREATININE 0.86 0.87  CALCIUM 10.0 9.8   INR  Date Value Ref Range Status  10/30/2009 0.95 0.00 - 1.49 Final     1 knee aspirate and intraop deep joint cultures yesterday still all pending  Exam Knee dressing dry.  hemovac patent with minimal bloody drainage only NVI        Plan Continue to follow cultures Patient may be OOB to chair ID has note in chart from yesterday so aware of his admission, cultures need to mature before decisions on antibiotics made  [facility] PA-C    07/21/2015, 9:04 AM Contact # [phone]

## 2015-07-21 NOTE — Anesthesia Postprocedure Evaluation (Signed)
  Anesthesia Post-op Note  Patient: Timothy Duran  Procedure(s) Performed: Procedure(s) (LRB): KNEE ARTHROTOMY Incision and Drainage left knee (Left)  Patient Location: PACU  Anesthesia Type: General  Level of Consciousness: awake and alert   Airway and Oxygen Therapy: Patient Spontanous Breathing  Post-op Pain: mild  Post-op Assessment: Post-op Vital signs reviewed, Patient's Cardiovascular Status Stable, Respiratory Function Stable, Patent Airway and No signs of Nausea or vomiting  Last Vitals:  Filed Vitals:   07/21/15 0539  BP: 131/81  Pulse: 97  Temp: 36.8 C  Resp: 15    Post-op Vital Signs: stable   Complications: No apparent anesthesia complications

## 2015-07-21 NOTE — Consult Note (Signed)
[place] for Infectious Disease    Date of Admission:  07/20/2015  Date of Consult:  07/21/2015  Reason for Consult: Recurrent septic arthritis Referring Physician: Dr. Alvan Dame   HPI: Timothy Duran is an 69 y.o. male who has had several years of left sided knee pain but this became acutely unbearable in late August resulting in admission to [facility] where he had aspirate of the knee which showed no crystals and could not have cell count due to clotting but had 87% PMNs, cloudy fluid and PMNs but no organisms on GS done with aspirate. Subsequent GS from or GNR and GPC but no organisms grew. It was  fellt he  had infected knee and was sp arthroscopic I and D by Dr Rolena Infante. Cultures never yielded and organism and he was placed on vancomycin IV and oral levaquin. He was not to his knowledge on any antecedent abx prior to his knee surgery other than some oral abx he was on when he had epistaxis requiring surgery from Dr. Janace Hoard. He experienced DRAMATIC improvement on abx and normalization of inflammatory markers  and my partner Dr. Megan Salon DC'd pts IV vancomycin and pt took levaquin for 7 additional days. Within 48 hours of stopping he levaquin he experienced recurrence of severe swelling of the knee with pain and fever. He called me yesterday re return of his ssx and I instructed him to come to ED for evaluation by Surgery so that he could have aspirate from the knee to see if he had infection again. I asked him to ask ED MDS not to give him antecedent abx. Regardless when he came in they did try to give him abx after drawing blood cultures but I appear to have been successful in DC them before they could be given. Aspirate from knee again with cloudy fluid but again speciment clotted for cell count. GS again with no ogranism. He was taken tot he OR by Dr Alvan Dame who performed:  Open arthrotomy of left knee with excisional debridement of about a 4-cm area of skin, subcutaneous  tissue, synovium followed by nonexcisional debridement with 6 liters of normal saline solution pulse Lavage.  ON 07/20/15. Cultures incubating and pt is back on vancomycin IV.   Past Medical History  Diagnosis Date  . Hypertension   . Abdominal hernia     "in there now" (05/20/2015)  . TIA (transient ischemic attack) 2004  . Arthritis     "terrible; all over" (05/20/2015)  . Prostate cancer ([place])   . Basal cell carcinoma     "left neck"  . Epistaxis 05/20/2015    hospitalized    Past Surgical History  Procedure Laterality Date  . Video assisted thoracoscopy (vats)/wedge resection Right 10/2009    "upper; benign"  . Prostatectomy  01/2010  . Cystoscopy w/ stone manipulation  1994    "got one stone; still has one lodge in there" (05/20/2015)  . Bladder stone removal  09/2009  . Basal cell carcinoma excision Left 2015    "neck"  . Nasal endoscopy with epistaxis control Left 05/21/2015    Procedure: NASAL ENDOSCOPY WITH EPISTAXIS CONTROL;  Surgeon: Melissa Montane, MD;  Location: [place];  Service: ENT;  Laterality: Left;  . Incision and drainage abscess Left 06/20/2015    Procedure: INCISION AND DRAINAGE ABSCESS;  Surgeon: Melina Schools, MD;  Location: [facility];  Service: Orthopedics;  Laterality: Left;    Social History:  reports that he quit smoking about 6 years ago. His smoking use included Cigarettes. He has a 4 pack-year smoking history. He has never used smokeless tobacco. He reports that he does not drink alcohol or use illicit drugs.   History reviewed. No pertinent family history.    Medications: I have reviewed patients current medications as documented in Epic Anti-infectives    Start     Dose/Rate Route Frequency Ordered Stop   07/20/15 2200  vancomycin (VANCOCIN) 1,000 mg in sodium chloride 0.9 % 250 mL IVPB  Status:  Discontinued     1,000 mg 250 mL/hr over 60 Minutes Intravenous Every 12 hours 07/20/15 2151 07/20/15 2153   07/20/15 2200  vancomycin (VANCOCIN) IVPB 1000 mg/200  mL premix     1,000 mg 200 mL/hr over 60 Minutes Intravenous Every 12 hours 07/20/15 2154     07/20/15 1700  levofloxacin (LEVAQUIN) IVPB 750 mg  Status:  Discontinued     750 mg 100 mL/hr over 90 Minutes Intravenous Every 24 hours 07/20/15 1633 07/20/15 1637   07/20/15 1645  vancomycin (VANCOCIN) 1,500 mg in sodium chloride 0.9 % 500 mL IVPB  Status:  Discontinued     1,500 mg 250 mL/hr over 120 Minutes Intravenous  Once 07/20/15 1633 07/20/15 1637         ROS: as per HPI and otherwise negative on 12 point review of systems.   Blood pressure 118/74, pulse 89, temperature 99.3 F (37.4 C), temperature source Oral, resp. rate 16, height $RemoveBe'5\' 8"'[id number]$  (1.727 m), weight 184 lb (83.462 kg), SpO2 98 %. General: Alert and awake, oriented x3, not in any acute distress. HEENT: anicteric sclera,  EOMI, oropharynx clear and without exudate Cardiovascular: regular rate, normal r,  no murmur rubs or gallops Pulmonary: clear to auscultation bilaterally, no wheezing, rales or rhonchi Gastrointestinal: soft nontender, nondistended, normal bowel sounds, Musculoskeletal: knee wrapped Skin, soft tissue: no rashes Neuro: nonfocal, strength and sensation intact   Results for orders placed or performed during the hospital encounter of 07/20/15 (from the past 48 hour(s))  CBC with Differential     Status: Abnormal   Collection Time: 07/20/15  3:25 PM  Result Value Ref Range   WBC 12.8 (H) 4.0 - 10.5 K/uL   RBC 5.00 4.22 - 5.81 MIL/uL   Hemoglobin 15.6 13.0 - 17.0 g/dL   HCT 45.8 39.0 - 52.0 %   MCV 91.6 78.0 - 100.0 fL   MCH 31.2 26.0 - 34.0 pg   MCHC 34.1 30.0 - 36.0 g/dL   RDW 14.1 11.5 - 15.5 %   Platelets 205 150 - 400 K/uL   Neutrophils Relative % 71 %   Neutro Abs 9.0 (H) 1.7 - 7.7 K/uL   Lymphocytes Relative 17 %   Lymphs Abs 2.2 0.7 - 4.0 K/uL   Monocytes Relative 11 %   Monocytes Absolute 1.4 (H) 0.1 - 1.0 K/uL   Eosinophils Relative 1 %   Eosinophils Absolute 0.2 0.0 - 0.7 K/uL    Basophils Relative 0 %   Basophils Absolute 0.1 0.0 - 0.1 K/uL  Basic metabolic panel     Status: Abnormal   Collection Time: 07/20/15  3:25 PM  Result Value Ref Range   Sodium 135 135 - 145 mmol/L   Potassium 4.3 3.5 - 5.1 mmol/L   Chloride 103 101 - 111 mmol/L   CO2 24 22 - 32 mmol/L   Glucose, Bld 108 (H) 65 - 99 mg/dL   BUN 24 (H) 6 -  20 mg/dL   Creatinine, Ser 0.86 0.61 - 1.24 mg/dL   Calcium 10.0 8.9 - 10.3 mg/dL   GFR calc non Af Amer >60 >60 mL/min   GFR calc Af Amer >60 >60 mL/min    Comment: (NOTE) The eGFR has been calculated using the CKD EPI equation. This calculation has not been validated in all clinical situations. eGFR's persistently <60 mL/min signify possible Chronic Kidney Disease.    Anion gap 8 5 - 15  C-reactive protein     Status: Abnormal   Collection Time: 07/20/15  3:25 PM  Result Value Ref Range   CRP 7.1 (H) <1.0 mg/dL    Comment: Performed at [facility]  Sedimentation rate     Status: None   Collection Time: 07/20/15  3:25 PM  Result Value Ref Range   Sed Rate 11 0 - 16 mm/hr  Synovial cell count + diff, w/ crystals     Status: Abnormal   Collection Time: 07/20/15  4:15 PM  Result Value Ref Range   Color, Synovial AMBER (A) YELLOW   Appearance-Synovial CLOUDY (A) CLEAR   Crystals, Fluid NO CRYSTALS SEEN    WBC, Synovial UNABLE TO PERFORM COUNT DUE TO CLOT IN SPECIMEN 0 - 200 /cu mm   Neutrophil, Synovial 87 (H) 0 - 25 %   Lymphocytes-Synovial Fld 11 0 - 20 %   Monocyte-Macrophage-Synovial Fluid 2 (L) 50 - 90 %   Eosinophils-Synovial 0 0 - 1 %  Body fluid culture     Status: None (Preliminary result)   Collection Time: 07/20/15  4:15 PM  Result Value Ref Range   Specimen Description KNEE LEFT    Special Requests Normal    Gram Stain      NO ORGANISMS SEEN WBC PRESENT, PREDOMINANTLY PMN Gram Stain Report Called to,Read Back By and Verified With: B.BARRETT AT 1727 ON 07/20/15 BY S.VANHOORNE    Culture      NO GROWTH < 24  HOURS Performed at [facility]    Report Status PENDING   Basic metabolic panel     Status: Abnormal   Collection Time: 07/21/15  5:16 AM  Result Value Ref Range   Sodium 134 (L) 135 - 145 mmol/L   Potassium 4.7 3.5 - 5.1 mmol/L   Chloride 103 101 - 111 mmol/L   CO2 26 22 - 32 mmol/L   Glucose, Bld 120 (H) 65 - 99 mg/dL   BUN 21 (H) 6 - 20 mg/dL   Creatinine, Ser 0.87 0.61 - 1.24 mg/dL   Calcium 9.8 8.9 - 10.3 mg/dL   GFR calc non Af Amer >60 >60 mL/min   GFR calc Af Amer >60 >60 mL/min    Comment: (NOTE) The eGFR has been calculated using the CKD EPI equation. This calculation has not been validated in all clinical situations. eGFR's persistently <60 mL/min signify possible Chronic Kidney Disease.    Anion gap 5 5 - 15  CBC     Status: Abnormal   Collection Time: 07/21/15  5:16 AM  Result Value Ref Range   WBC 11.8 (H) 4.0 - 10.5 K/uL   RBC 4.70 4.22 - 5.81 MIL/uL   Hemoglobin 14.3 13.0 - 17.0 g/dL   HCT 43.6 39.0 - 52.0 %   MCV 92.8 78.0 - 100.0 fL   MCH 30.4 26.0 - 34.0 pg   MCHC 32.8 30.0 - 36.0 g/dL   RDW 14.1 11.5 - 15.5 %   Platelets 190 150 - 400 K/uL    )  Recent Results (from the past 720 hour(s))  Anaerobic culture     Status: None   Collection Time: 06/23/15  4:08 PM  Result Value Ref Range Status   Specimen Description SYNOVIAL RIGHT ANKLE  Final   Special Requests NONE  Final   Gram Stain   Final    RARE WBC PRESENT, PREDOMINANTLY PMN RARE GRAM NEGATIVE RODS RARE GRAM POSITIVE COCCI GRAM STAIN REVIEWED-AGREE WITH RESULT M VESTAL    Culture   Final    NO ANAEROBES ISOLATED Performed at [facility]    Report Status 06/29/2015 FINAL  Final  Body fluid culture     Status: None   Collection Time: 06/23/15  4:08 PM  Result Value Ref Range Status   Specimen Description FLUID SYNOVIAL ANKLE  Final   Special Requests NONE  Final   Gram Stain   Final    RARE WBC PRESENT, PREDOMINANTLY PMN RARE GRAM NEGATIVE RODS RARE GRAM POSITIVE  COCCI GRAM STAIN REVIEWED-AGREE WITH RESULT M VESTAL    Culture   Final    NO GROWTH 3 DAYS Performed at [facility]    Report Status 06/27/2015 FINAL  Final  Body fluid culture     Status: None (Preliminary result)   Collection Time: 07/20/15  4:15 PM  Result Value Ref Range Status   Specimen Description KNEE LEFT  Final   Special Requests Normal  Final   Gram Stain   Final    NO ORGANISMS SEEN WBC PRESENT, PREDOMINANTLY PMN Gram Stain Report Called to,Read Back By and Verified With: B.BARRETT AT 1727 ON 07/20/15 BY S.VANHOORNE    Culture   Final    NO GROWTH < 24 HOURS Performed at [facility]    Report Status PENDING  Incomplete     Impression/Recommendation  Active Problems:   Septic joint of left knee joint ([place])   Timothy Duran is a 69 y.o. male with  Recurrent septic arthritis of knee   #1 Recurrent septic arthritis:  Likely needed an open surgery the first time though curious no organism identified that time response to abx indicates this must have been an infection  Continue IV vancomycin for now  followuup cultures  Recheck ESR, CRP  Will consider giving him something for better bactericidal  strep coverage such as rocephin   Dr. Linus Salmons to take over the service tomorrow.   07/21/2015, 4:48 PM   Thank you so much for this interesting consult  [place] for [place] [phone] (pager) [phone] (office) 07/21/2015, 4:48 PM  Rhina Brack[place] 07/21/2015, 4:48 PM

## 2015-07-22 ENCOUNTER — Encounter (HOSPITAL_COMMUNITY): Payer: Self-pay | Admitting: Orthopedic Surgery

## 2015-07-22 DIAGNOSIS — M009 Pyogenic arthritis, unspecified: Principal | ICD-10-CM

## 2015-07-22 MED ORDER — KETOROLAC TROMETHAMINE 15 MG/ML IJ SOLN
7.5000 mg | Freq: Four times a day (QID) | INTRAMUSCULAR | Status: AC
  Administered 2015-07-22 – 2015-07-23 (×4): 7.5 mg via INTRAVENOUS
  Filled 2015-07-22 (×4): qty 1

## 2015-07-22 MED ORDER — DEXTROSE 5 % IV SOLN
2.0000 g | Freq: Every day | INTRAVENOUS | Status: DC
  Administered 2015-07-22 – 2015-07-23 (×2): 2 g via INTRAVENOUS
  Filled 2015-07-22 (×3): qty 2

## 2015-07-22 NOTE — Progress Notes (Signed)
Patient ID: Timothy Duran, male   DOB: [birth year], 69 y.o.   MRN: [id number] Subjective: 2 Days Post-Op Procedure(s) (LRB): KNEE ARTHROTOMY Incision and Drainage left knee (Left)    Patient reports pain as mild to moderate.  But not much activity as of now.  Complains of right shoulder pain he believes began at last hospital stay when using trapeze bar  Objective:   VITALS:   Filed Vitals:   07/22/15 0442  BP: 133/71  Pulse: 87  Temp: 99 F (37.2 C)  Resp: 16    Neurovascular intact Incision: dressing C/D/I  Ace wrap dry Pulled Hemovac this am  LABS  Recent Labs  07/20/15 1525 07/21/15 0516  HGB 15.6 14.3  HCT 45.8 43.6  WBC 12.8* 11.8*  PLT 205 190     Recent Labs  07/20/15 1525 07/21/15 0516  NA 135 134*  K 4.3 4.7  BUN 24* 21*  CREATININE 0.86 0.87  GLUCOSE 108* 120*    No results for input(s): LABPT, INR in the last 72 hours.   Assessment/Plan: 2 Days Post-Op Procedure(s) (LRB): KNEE ARTHROTOMY Incision and Drainage left knee (Left)   Continue ABX therapy due to Culture taken at time of surgery.  ID on board, appreciate their input Knee purulence had more Strep appearance than staph, cloudy versus creamy white Plan for discharge tomorrow if can arrange antibiotics If IV antibiotic necessary will need PICC line ordered

## 2015-07-22 NOTE — Progress Notes (Signed)
[place] for Infectious Disease   Date of Admission:  07/20/2015  Antibiotics: vancomycin  Subjective: Feels better  Objective: Temp:  [98.2 F (36.8 C)-99.6 F (37.6 C)] 99.4 F (37.4 C) (10/03 1430) Pulse Rate:  [87-105] 95 (10/03 1430) Resp:  [16-18] 18 (10/03 1430) BP: (133-137)/(67-79) 135/79 mmHg (10/03 1430) SpO2:  [94 %-96 %] 94 % (10/03 1430)  General: awake, in bed, nad Skin: no rashes Lungs: CTA Cor: RR Abdomen: soft, nt Ext: left wrapped  A comprehensive review of systems was negative except for: Constitutional: positive for fatigue  Lab Results Lab Results  Component Value Date   WBC 11.8* 07/21/2015   HGB 14.3 07/21/2015   HCT 43.6 07/21/2015   MCV 92.8 07/21/2015   PLT 190 07/21/2015    Lab Results  Component Value Date   CREATININE 0.87 07/21/2015   BUN 21* 07/21/2015   NA 134* 07/21/2015   K 4.7 07/21/2015   CL 103 07/21/2015   CO2 26 07/21/2015    Lab Results  Component Value Date   ALT 17 05/08/2015   AST 16 05/08/2015   ALKPHOS 89 05/08/2015   BILITOT 0.3 05/08/2015      Microbiology: Recent Results (from the past 240 hour(s))  Body fluid culture     Status: None (Preliminary result)   Collection Time: 07/20/15  4:15 PM  Result Value Ref Range Status   Specimen Description KNEE LEFT  Final   Special Requests Normal  Final   Gram Stain   Final    NO ORGANISMS SEEN WBC PRESENT, PREDOMINANTLY PMN Gram Stain Report Called to,Read Back By and Verified With: B.BARRETT AT 1727 ON 07/20/15 BY S.VANHOORNE    Culture   Final    NO GROWTH 2 DAYS Performed at [facility]    Report Status PENDING  Incomplete  Wound culture     Status: None (Preliminary result)   Collection Time: 07/20/15 10:00 PM  Result Value Ref Range Status   Specimen Description WOUND LEFT KNEE  Final   Special Requests NONE  Final   Gram Stain PENDING  Incomplete   Culture   Final    NO GROWTH 1 DAY Performed at [facility]    Report Status PENDING  Incomplete  Anaerobic culture     Status: None (Preliminary result)   Collection Time: 07/20/15 10:02 PM  Result Value Ref Range Status   Specimen Description WOUND LEFT KNEE  Final   Special Requests NONE  Final   Gram Stain PENDING  Incomplete   Culture   Final    NO ANAEROBES ISOLATED; CULTURE IN PROGRESS FOR 5 DAYS Performed at [facility]    Report Status PENDING  Incomplete    Studies/Results: No results found.  Assessment/Plan:  1) recurrent septic arthritis - no culture to date.  Did have positive gram stain of ankle swelling in previous hospitalization with GPC and GNR but nothing ever grew.  I will change him to ceftriaxone for a projected 4 weeks.   I ordered a picc line CM already ordered  Diagnosis: Septic arthritis  Culture Result: negative    Discharge antibiotics: Per pharmacy protocol ceftriaxone 2 grams once daily Duration: 4 weeks End Date: October 28th  [place] Per Protocol: Labs weekly while on IV antibiotics: __x CBC with differential _x_ CMP _x_ CRP _x_ ESR __ Vancomycin trough  Fax weekly labs to [phone]  Clinic Follow Up Appt: Will arrange with Dr. Megan Salon in 2-3 weeks  @  [facility]char[facility] Gloss, [place] for Infectious Disease [place] [email or web address] [phone] pager   [phone] cell 07/22/2015, 4:12 PM

## 2015-07-23 LAB — WOUND CULTURE: CULTURE: NO GROWTH

## 2015-07-23 LAB — HEPATITIS C ANTIBODY: HCV Ab: <0.1 s/co ratio (ref 0.0–0.9)

## 2015-07-23 LAB — HIV ANTIBODY (ROUTINE TESTING W REFLEX): HIV SCREEN 4TH GENERATION: NONREACTIVE

## 2015-07-23 MED ORDER — HEPARIN SOD (PORK) LOCK FLUSH 100 UNIT/ML IV SOLN
250.0000 [IU] | Freq: Every day | INTRAVENOUS | Status: DC
  Filled 2015-07-23: qty 3

## 2015-07-23 MED ORDER — DEXTROSE 5 % IV SOLN: 2.0000 g | Freq: Every day | INTRAVENOUS | Status: AC

## 2015-07-23 MED ORDER — ACETAMINOPHEN 325 MG PO TABS: 650.0000 mg | ORAL_TABLET | Freq: Four times a day (QID) | ORAL | Status: DC | PRN

## 2015-07-23 MED ORDER — HEPARIN SOD (PORK) LOCK FLUSH 100 UNIT/ML IV SOLN
250.0000 [IU] | INTRAVENOUS | Status: DC | PRN
  Administered 2015-07-23: 250 [IU]
  Filled 2015-07-23: qty 3

## 2015-07-23 MED ORDER — TIZANIDINE HCL 4 MG PO TABS: 4.0000 mg | ORAL_TABLET | Freq: Four times a day (QID) | ORAL | Status: DC | PRN

## 2015-07-23 MED ORDER — POLYETHYLENE GLYCOL 3350 17 G PO PACK: 17.0000 g | PACK | Freq: Every day | ORAL | Status: DC | PRN

## 2015-07-23 MED ORDER — OXYCODONE HCL 5 MG PO TABS: 5.0000 mg | ORAL_TABLET | ORAL | Status: DC | PRN

## 2015-07-23 MED ORDER — SODIUM CHLORIDE 0.9 % IJ SOLN
10.0000 mL | INTRAMUSCULAR | Status: DC | PRN
  Administered 2015-07-23: 10 mL
  Filled 2015-07-23: qty 40

## 2015-07-23 NOTE — Care Management Important Message (Signed)
Important Message  Patient Details  Name: Timothy Duran MRN: [id number] Date of Birth: Jun 27, 1946   Medicare Important Message Given:  [facility] notification given    Camillo Flaming 07/23/2015, 1:49 [place] Message  Patient Details  Name: Timothy Duran MRN: [id number] Date of Birth: 16-Jun-1946   Medicare Important Message Given:  Yes-second notification given    Camillo Flaming 07/23/2015, 1:49 PM

## 2015-07-23 NOTE — Progress Notes (Signed)
Peripherally Inserted Central Catheter/Midline Placement  The IV Nurse has discussed with the patient and/or persons authorized to consent for the patient, the purpose of this procedure and the potential benefits and risks involved with this procedure.  The benefits include less needle sticks, lab draws from the catheter and patient may be discharged home with the catheter.  Risks include, but not limited to, infection, bleeding, blood clot (thrombus formation), and puncture of an artery; nerve damage and irregular heat beat.  Alternatives to this procedure were also discussed.  PICC/Midline Placement Documentation  PICC / Midline Single Lumen 40/10/27 PICC Right Basilic 40 cm 2 cm (Active)     PICC / Midline Single Lumen 25/36/64 PICC Left Basilic 41 cm 0 cm (Active)  Indication for Insertion or Continuance of Line Home intravenous therapies (PICC only) 07/23/2015 12:00 PM  Exposed Catheter (cm) 0 cm 07/23/2015 12:00 PM  Dressing Change Due 07/30/15 07/23/2015 12:00 PM       Holley Bouche Renee 07/23/2015, 12:31 PM

## 2015-07-23 NOTE — Progress Notes (Signed)
Patient ID: TORRES HARDENBROOK, male   DOB: Sep 13, 1946, 69 y.o.   MRN: [id number] Subjective: 3 Days Post-Op Procedure(s) (LRB): KNEE ARTHROTOMY Incision and Drainage left knee (Left)    Patient reports pain as mild.  Objective:   VITALS:   Filed Vitals:   07/23/15 0339  BP: 135/69  Pulse: 108  Temp: 98.4 F (36.9 C)  Resp: 18    Neurovascular intact Incision: dressing C/D/I   Surgical dressing removed, no drainage from drain site or wound  LABS  Recent Labs  07/20/15 1525 07/21/15 0516  HGB 15.6 14.3  HCT 45.8 43.6  WBC 12.8* 11.8*  PLT 205 190     Recent Labs  07/20/15 1525 07/21/15 0516  NA 135 134*  K 4.3 4.7  BUN 24* 21*  CREATININE 0.86 0.87  GLUCOSE 108* 120*    No results for input(s): LABPT, INR in the last 72 hours.   Assessment/Plan: 3 Days Post-Op Procedure(s) (LRB): KNEE ARTHROTOMY Incision and Drainage left knee (Left)   Discharge home with home health, IV antibiotics RTC in 2 weeks for wound check F/u with ID per their recs

## 2015-07-23 NOTE — Progress Notes (Signed)
[facility]  Patient Status:  New pt with [facility] this admission (pt with [facility] in September for home IV ABX)  [facility] is providing the following services: HHRN and Home infusion pharmacy for home IV ABX.  If patient discharges after hours, please call [phone].   Timothy Duran 07/23/2015, 10:55 AM

## 2015-07-23 NOTE — Care Management Note (Signed)
Case Management Note  Patient Details  Name: Timothy Duran MRN: [id number] Date of Birth: 01/30/1946  Subjective/Objective:                   KNEE ARTHROTOMY Incision and Drainage left knee (Left) Action/Plan: Discharge planning Expected Discharge Date:  07/23/15               Expected Discharge Plan:  [place]  In-House Referral:  NA  Discharge planning Services  CM Consult  Post Acute Care Choice:  Home Health Choice offered to:  Patient  DME Arranged:  IV pump/equipment DME Agency:  [facility] Arranged:  RN, Disease Management, IV Antibiotics HH Agency:  [place]  Status of Service:  Completed, signed off  Medicare Important Message Given:    Date Medicare IM Given:    Medicare IM give by:    Date Additional Medicare IM Given:    Additional Medicare Important Message give by:     If discussed at [place] of Stay Meetings, dates discussed:    Additional Comments: CM met with pt and spouse who has run home IV ABX for husband in the recent past and states she feels comfortable in "doing it again."  Pt to get PICC todasy.  CM has requested prescription for [facility] pharmacy.  Referral called to [facility] rep, Kristen.  Waiting for prescription. Dellie Catholic, RN 07/23/2015, 10:41 AM

## 2015-07-24 LAB — BODY FLUID CULTURE
Culture: NO GROWTH
Gram Stain: NONE SEEN
Special Requests: NORMAL

## 2015-07-26 ENCOUNTER — Other Ambulatory Visit: Payer: Self-pay | Admitting: Nurse Practitioner

## 2015-07-26 LAB — ANAEROBIC CULTURE

## 2015-07-26 NOTE — Discharge Summary (Signed)
Physician Discharge Summary  Patient ID: Timothy Duran MRN: [id number] DOB/AGE: [birth year] 69 y.o.  Admit date: 07/20/2015 Discharge date: 07/23/2015   Procedures:  Procedure(s) (LRB): KNEE ARTHROTOMY Incision and Drainage left knee (Left)  Attending Physician:  Dr. Paralee Cancel   Admission Diagnoses:   Recurrent left knee pain   Discharge Diagnoses:  Active Problems:   Septic joint of left knee joint (HCC)   Pyogenic arthritis of left knee joint (HCC)   Recurrent infections  Past Medical History  Diagnosis Date  . Hypertension   . Abdominal hernia     "in there now" (05/20/2015)  . TIA (transient ischemic attack) 2004  . Arthritis     "terrible; all over" (05/20/2015)  . Prostate cancer ([place])   . Basal cell carcinoma     "left neck"  . Epistaxis 05/20/2015    hospitalized    HPI:    Pt is a 69 y.o. male with history of septic left knee. He underwent an arthroscopic I&D of the left knee 9/1 by Dr. Rolena Infante. He has recently, this past week, completed course of IV Vancomycin and PO levaquin. He began developing recurrent left knee pain, swelling without fever. After speaking with him over the phone today he did not feel he could wait until next week to return to office and presented to the [facility] ER this evening for evaluation. Aspiration by ER physician revealed cloudy fluid by cell count could not be performed. Ortho was consulted for recurrent infection left knee.  PCP: Chevis Pretty, FNP   Discharged Condition: good  Hospital Course:  Patient underwent the above stated procedure on 07/20/2015. Patient tolerated the procedure well and brought to the recovery room in good condition and subsequently to the floor.  POD #1 BP: 131/81 ; Pulse: 97 ; Temp: 98.3 F (36.8 C) ; Resp: 15 Awake and alert. Pain controlled currently.   LABS  Basename    HGB  14.3  HCT  43.6   POD #2  BP: 133/71 ; Pulse: 87 ; Temp: 99 F (37.2 C) ; Resp: 16 Patient reports pain as mild to  moderate. But not much activity as of now. Complains of right shoulder pain he believes began at last hospital stay when using trapeze bar. Neurovascular intact, incision: dressing C/D/I, Ace wrap dry and pulled Hemovac this am.  POD #3  BP: 135/69 ; Pulse: 108 ; Temp: 98.4 F (36.9 C) ; Resp: 18 Patient reports pain as mild. Ready to be discharged home. Neurovascular intact, incision: dressing C/D/I ans surgical dressing removed, no drainage from drain site or wound.    Discharge Exam: General appearance: alert, cooperative and no distress Extremities: Homans sign is negative, no sign of DVT, no edema, redness or tenderness in the calves or thighs and no ulcers, gangrene or trophic changes  Disposition: Home with follow up in 2 weeks   Follow-up Information    Follow up with Michel Bickers, MD. Schedule an appointment as soon as possible for a visit in 2 weeks.   Specialty:  Infectious Diseases   Contact information:   [address] [place] [address] [phone]       Follow up with Mauri Pole, MD. Schedule an appointment as soon as possible for a visit in 2 weeks.   Specialty:  Orthopedic Surgery   Contact information:   [address] [place] [address] [phone]       Follow up with [facility].  Why:  home health nurse for IV antibiotic administration and PICC line care   Contact information:   [place] [address] [phone]           Medication List    STOP taking these medications        lansoprazole 30 MG capsule  Commonly known as:  PREVACID      TAKE these medications        acetaminophen 325 MG tablet  Commonly known as:  TYLENOL  Take 2 tablets (650 mg total) by mouth every 6 (six) hours as needed for mild pain (or Fever >/= 101).     cefTRIAXone 2 g in dextrose 5 % 50 mL  Inject 2 g into the vein daily at 6 PM.     clopidogrel 75 MG tablet  Commonly  known as:  PLAVIX  TAKE ONE TABLET BY MOUTH ONCE DAILY     colchicine 0.6 MG tablet  Take 1 tablet (0.6 mg total) by mouth 2 (two) times daily.     docusate sodium 100 MG capsule  Commonly known as:  COLACE  Take 1 capsule (100 mg total) by mouth 2 (two) times daily.     olmesartan-hydrochlorothiazide 20-12.5 MG tablet  Commonly known as:  BENICAR HCT  Take 1 tablet by mouth daily.     oxyCODONE 5 MG immediate release tablet  Commonly known as:  Oxy IR/ROXICODONE  Take 1-2 tablets (5-10 mg total) by mouth every 4 (four) hours as needed for breakthrough pain.     polyethylene glycol packet  Commonly known as:  MIRALAX / GLYCOLAX  Take 17 g by mouth daily as needed for mild constipation.     tiZANidine 4 MG tablet  Commonly known as:  ZANAFLEX  Take 1 tablet (4 mg total) by mouth every 6 (six) hours as needed for muscle spasms.         Signed: [place] Pugh. Leilany Digeronimo   PA-C  07/26/2015, 11:05 AM

## 2015-08-01 ENCOUNTER — Encounter: Payer: Self-pay | Admitting: Internal Medicine

## 2015-08-01 ENCOUNTER — Telehealth: Payer: Self-pay | Admitting: *Deleted

## 2015-08-01 NOTE — Telephone Encounter (Signed)
Patient's wife called concerned about a very uncomfortable rash in his groin area that started this morning. It is very red, itchy, and warm to the touch. Spoke to Dr. Megan Salon and he suggested an over the counter antifungal cream, clotrimazole applied to the affected area. Any questions regarding this medication can be answered by the pharmacist. If no better by Monday, 08/05/15 he should give this office a call. Patient is in agreement with this plan.

## 2015-08-13 ENCOUNTER — Ambulatory Visit (INDEPENDENT_AMBULATORY_CARE_PROVIDER_SITE_OTHER): Payer: Medicare Other

## 2015-08-13 ENCOUNTER — Encounter: Payer: Self-pay | Admitting: Nurse Practitioner

## 2015-08-13 ENCOUNTER — Ambulatory Visit (INDEPENDENT_AMBULATORY_CARE_PROVIDER_SITE_OTHER): Payer: Medicare Other | Admitting: Nurse Practitioner

## 2015-08-13 VITALS — BP 135/84 | HR 109 | Temp 97.3°F | Ht 68.0 in | Wt 187.0 lb

## 2015-08-13 DIAGNOSIS — M25511 Pain in right shoulder: Secondary | ICD-10-CM | POA: Diagnosis not present

## 2015-08-13 DIAGNOSIS — I10 Essential (primary) hypertension: Secondary | ICD-10-CM | POA: Diagnosis not present

## 2015-08-13 DIAGNOSIS — M00862 Arthritis due to other bacteria, left knee: Secondary | ICD-10-CM | POA: Diagnosis not present

## 2015-08-13 DIAGNOSIS — Z6828 Body mass index (BMI) 28.0-28.9, adult: Secondary | ICD-10-CM | POA: Diagnosis not present

## 2015-08-13 DIAGNOSIS — K219 Gastro-esophageal reflux disease without esophagitis: Secondary | ICD-10-CM | POA: Diagnosis not present

## 2015-08-13 DIAGNOSIS — G459 Transient cerebral ischemic attack, unspecified: Secondary | ICD-10-CM | POA: Diagnosis not present

## 2015-08-13 MED ORDER — CLOPIDOGREL BISULFATE 75 MG PO TABS: 75.0000 mg | ORAL_TABLET | Freq: Every day | ORAL | Status: DC

## 2015-08-13 NOTE — Progress Notes (Signed)
Subjective:    Patient ID: Timothy Duran, male    DOB: [birth year], 69 y.o.   MRN: [id number]  Patient here today for follow up of chronic medical problems. He has been in and out of the hospital the last several months with Left knee abscess and is still on antibiotics.  * Patient c/o right shoulder pain- said he was pulling up in bed while in hospital and felt shoulder pop - he tlod people at hospital but they did not check it out. Pain with any movement.  Hypertension This is a chronic problem. The current episode started more than 1 year ago. The problem has been waxing and waning since onset. The problem is resistant. Pertinent negatives include no chest pain, headaches, palpitations or shortness of breath. Risk factors for coronary artery disease include dyslipidemia and male gender. Past treatments include angiotensin blockers and diuretics. The current treatment provides moderate improvement. Compliance problems include diet and exercise.  Hypertensive end-organ damage includes CVA. There is no history of CAD/MI.  Hx TIA Greater then 5 years ago- currently on plavix without any bleeding. GERD Currently just doing diet control.    Review of Systems  Constitutional: Negative.   HENT: Negative.   Respiratory: Negative.  Negative for shortness of breath.   Cardiovascular: Negative for chest pain and palpitations.  Genitourinary: Negative.   Neurological: Negative for headaches.  Psychiatric/Behavioral: Negative.   All other systems reviewed and are negative.      Objective:   Physical Exam  Constitutional: He is oriented to person, place, and time. He appears well-developed and well-nourished.  HENT:  Head: Normocephalic.  Right Ear: External ear normal.  Left Ear: External ear normal.  Nose: Nose normal.  Mouth/Throat: Oropharynx is clear and moist.  Eyes: EOM are normal. Pupils are equal, round, and reactive to light.  Neck: Normal range of motion. Neck supple. No JVD  present. No thyromegaly present.  Cardiovascular: Normal rate, regular rhythm, normal heart sounds and intact distal pulses.  Exam reveals no gallop and no friction rub.   No murmur heard. Pulmonary/Chest: Effort normal and breath sounds normal. No respiratory distress. He has no wheezes. He has no rales. He exhibits no tenderness.  Abdominal: Soft. Bowel sounds are normal. He exhibits no mass. There is no tenderness.  Genitourinary:  Refused prostate rectal exam  Musculoskeletal: Normal range of motion. He exhibits no edema.  Large tneder nodule on right anterior shoulder- pain with movement in any direction- unable to abduct at all Grips equal bilaterally Motor strength and sensation distally intact.  Lymphadenopathy:    He has no cervical adenopathy.  Neurological: He is alert and oriented to person, place, and time. No cranial nerve deficit.  Skin: Skin is warm and dry.  Packing change to sebaceous cyst on upper mid back- no sign of infection  Psychiatric: He has a normal mood and affect. His behavior is normal. Judgment and thought content normal.    BP 135/84 mmHg  Pulse 109  Temp(Src) 97.3 F (36.3 C) (Oral)  Ht _0  (1.727 m)  Wt 187 lb (84.823 kg)  BMI 28.44 kg/m2  Right shoulder xray- possible anterior dislocation-Preliminary reading by Ronnald Collum, FNP  [facility]       Assessment & Plan:   1. Essential hypertension Do nt add salt to diet - CMP14+EGFR - Lipid panel  2. Transient cerebral ischemia, unspecified transient cerebral ischemia type  3. Gastroesophageal reflux disease without esophagitis Avoid spicy foods Do not eat 2 hours  prior to bedtime   4. BMI 28.0-28.9,adult Discussed diet and exercise for person with BMI >25 Will recheck weight in 3-6 months   5. Arthritis of left knee due to other bacteria [facility]) Keep follow up with infectious disease  6. Right shoulder pain - DG Shoulder Right; Future - Ambulatory referral to [place], [place]

## 2015-08-13 NOTE — Patient Instructions (Signed)

## 2015-08-14 LAB — LIPID PANEL
CHOLESTEROL TOTAL: 210 mg/dL — AB (ref 100–199)
Chol/HDL Ratio: 4.6 ratio units (ref 0.0–5.0)
HDL: 46 mg/dL (ref >39)
LDL CALC: 137 mg/dL — AB (ref 0–99)
TRIGLYCERIDES: 135 mg/dL (ref 0–149)
VLDL Cholesterol Cal: 27 mg/dL (ref 5–40)

## 2015-08-14 LAB — CMP14+EGFR
ALBUMIN: 4.2 g/dL (ref 3.5–4.8)
ALK PHOS: 86 IU/L (ref 39–117)
ALT: 18 IU/L (ref 0–44)
AST: 13 IU/L (ref 0–40)
Albumin/Globulin Ratio: 1.8 (ref 1.1–2.5)
BILIRUBIN TOTAL: 0.2 mg/dL (ref 0.0–1.2)
BUN / CREAT RATIO: 18 (ref 10–22)
BUN: 16 mg/dL (ref 8–27)
CHLORIDE: 101 mmol/L (ref 97–106)
CO2: 27 mmol/L (ref 18–29)
CREATININE: 0.87 mg/dL (ref 0.76–1.27)
Calcium: 10.4 mg/dL — ABNORMAL HIGH (ref 8.6–10.2)
GFR calc Af Amer: 100 mL/min/{1.73_m2} (ref >59)
GFR calc non Af Amer: 87 mL/min/{1.73_m2} (ref >59)
GLOBULIN, TOTAL: 2.3 g/dL (ref 1.5–4.5)
Glucose: 116 mg/dL — ABNORMAL HIGH (ref 65–99)
POTASSIUM: 4.3 mmol/L (ref 3.5–5.2)
SODIUM: 143 mmol/L (ref 136–144)
Total Protein: 6.5 g/dL (ref 6.0–8.5)

## 2015-08-22 ENCOUNTER — Telehealth: Payer: Self-pay | Admitting: *Deleted

## 2015-08-22 ENCOUNTER — Encounter: Payer: Self-pay | Admitting: Internal Medicine

## 2015-08-22 ENCOUNTER — Ambulatory Visit (INDEPENDENT_AMBULATORY_CARE_PROVIDER_SITE_OTHER): Payer: Medicare Other | Admitting: Internal Medicine

## 2015-08-22 DIAGNOSIS — M00062 Staphylococcal arthritis, left knee: Secondary | ICD-10-CM

## 2015-08-22 LAB — C-REACTIVE PROTEIN: CRP: 2.2 mg/dL — AB (ref <0.60)

## 2015-08-22 NOTE — Assessment & Plan Note (Signed)
Mr. Zetina has had recurrent, culture negative septic arthritis of his left knee and has now completed a second 4 week long course of antibiotics. I discussed the pros and cons of continuing therapy with an oral antibiotic. I will check repeat sedimentation rate and C-reactive protein and call him with my final recommendations tomorrow.

## 2015-08-22 NOTE — Progress Notes (Addendum)
Patient ID: Timothy Duran, male   DOB: [birth year], 69 y.o.   MRN: [id number]         [facility] for Infectious Disease  Patient Active Problem List   Diagnosis Date Noted  . Septic arthritis of knee, left ([place]) 06/20/2015    Priority: High  . BMI 28.0-28.9,adult 08/13/2015  . Pyogenic arthritis of left knee joint ([place])   . Recurrent infections   . Septic joint of left knee joint ([place]) 07/20/2015  . Epistaxis 05/20/2015  . Bleeding nose 05/20/2015  . Gastroesophageal reflux disease without esophagitis 05/13/2015  . Hypertension 08/31/2013  . TIA (transient ischemic attack) 08/31/2013    Patient's Medications  New Prescriptions   No medications on file  Previous Medications   ACETAMINOPHEN (TYLENOL) 325 MG TABLET    Take 2 tablets (650 mg total) by mouth every 6 (six) hours as needed for mild pain (or Fever >/= 101).   CLOPIDOGREL (PLAVIX) 75 MG TABLET    Take 1 tablet (75 mg total) by mouth daily.   OLMESARTAN-HYDROCHLOROTHIAZIDE (BENICAR HCT) 20-12.5 MG PER TABLET    Take 1 tablet by mouth daily.   POLYETHYLENE GLYCOL (MIRALAX / GLYCOLAX) PACKET    Take 17 g by mouth daily as needed for mild constipation.  Modified Medications   No medications on file  Discontinued Medications   No medications on file    Subjective: Timothy Duran is in with his wife for his hospital follow-up visit. He was hospitalized in August with acute left knee pain and swelling. He developed left ankle pain and swelling while hospitalized. Synovial fluid white cell counts suggested possible septic arthritis. All gram stains of synovial fluid from his left knee were negative for organisms and all cultures were negative. Gram stain of left ankle synovial fluid showed gram-positive cocci and gram-negative rods. Left ankle cultures were also negative. No crystals were seen on synovial fluid exam. Because of concerns for septic arthritis he was discharged on IV vancomycin and oral levofloxacin. He received 3  weeks of IV vancomycin and 4 weeks of oral levofloxacin completing therapy around 07/17/2015. Within 48 hours of completing antibiotics he developed recurrent pain, swelling and redness of his left knee leading to rehospitalization on 07/20/2015. Synovial fluid analyses again suggested septic arthritis but no cell count could be done because specimen was clotted. No crystals were seen. He underwent open incision and drainage. Gram stain and cultures were again negative. He was seen by my partner, Dr. Linus Salmons, who elected to treat him with IV ceftriaxone for 4 weeks. He completed that 2 days ago. He had no problems tolerating his ceftriaxone or PICC. His left knee is feeling much better. He has not had any diarrhea, fever, chills or sweats.  He is also noted some sharp pains in his right shoulder anteriorly. He states that this began suddenly when he was pulling himself up on the trapeze bar when he was first hospitalized on 06/20/2015. He has not had any swelling, redness or warmth in his shoulder. His wife asked if they should be concerned about possible infection in his shoulder.  Review of Systems: Pertinent items are noted in HPI.  Past Medical History  Diagnosis Date  . Hypertension   . Abdominal hernia     "in there now" (05/20/2015)  . TIA (transient ischemic attack) 2004  . Arthritis     "terrible; all over" (05/20/2015)  . Prostate cancer ([employer])   . Basal cell carcinoma     "left neck"  .  Epistaxis 05/20/2015    hospitalized    Social History  Substance Use Topics  . Smoking status: Former Smoker -- 1.00 packs/day for 4 years    Types: Cigarettes    Quit date: 08/29/2008  . Smokeless tobacco: Never Used  . Alcohol Use: No    No family history on file.   Objective: Filed Vitals:   08/22/15 1331  BP: 107/75  Pulse: 103  Temp: 98 F (36.7 C)  TempSrc: Oral  Weight: 189 lb (85.73 kg)   Body mass index is 28.74 kg/(m^2).  General: He is smiling and in no distress Skin: Left  arm PICC site appears normal Lungs: Clear Cor: Regular S1 and S2 with no murmur Right shoulder: No swelling, redness or warmth. He has point tenderness anteriorly. Left knee arthrotomy incision healing nicely. There is no unusual swelling, redness or warmth Left ankle: Some superficial desquamation medially but no swelling, redness or warmth  Lab Results SED RATE (mm/hr)  Date Value  07/20/2015 11  06/19/2015 34*  06/18/2015 25*   CRP (mg/dL)  Date Value  07/20/2015 7.1*  06/19/2015 13.3*     Problem List Items Addressed This Visit      High   Septic arthritis of knee, left [facility])    Mr. Rushlow has had recurrent, culture negative septic arthritis of his left knee and has now completed a second 4 week long course of antibiotics. I discussed the pros and cons of continuing therapy with an oral antibiotic. I will check repeat sedimentation rate and C-reactive protein and call him with my final recommendations tomorrow.      Relevant Orders   C-reactive protein   Sedimentation rate       Michel Bickers, MD [facility] for Infectious Disease [place] [phone] pager   [phone] cell 08/22/2015, 2:26 PM   Addendum:  SED RATE (mm/hr)  Date Value  08/22/2015 5  07/20/2015 11  06/19/2015 34*   CRP (mg/dL)  Date Value  08/22/2015 2.2*  07/20/2015 7.1*  06/19/2015 13.3*   His inflammatory markers have improved but his C-reactive protein remains mildly elevated. I reviewed this with [name] by phone today and review treatment options. I will treat him with 3 more weeks of oral levofloxacin. He will followup with me in 4 weeks.  Michel Bickers, MD [facility] for Infectious [place] [facility] [phone] pager   [phone] cell 08/23/2015, 3:21 PM

## 2015-08-22 NOTE — Telephone Encounter (Signed)
Order from Dr. Megan Salon to remove PICC called to [facility] given to Amy, [facility] Pharmacist.  Order repeated back.

## 2015-08-23 ENCOUNTER — Other Ambulatory Visit: Payer: Self-pay | Admitting: Internal Medicine

## 2015-08-23 DIAGNOSIS — M00062 Staphylococcal arthritis, left knee: Secondary | ICD-10-CM

## 2015-08-23 LAB — SEDIMENTATION RATE: SED RATE: 5 mm/h (ref 0–20)

## 2015-08-23 MED ORDER — LEVOFLOXACIN 500 MG PO TABS: 500.0000 mg | ORAL_TABLET | Freq: Every day | ORAL | Status: DC

## 2015-09-19 ENCOUNTER — Telehealth: Payer: Self-pay | Admitting: Nurse Practitioner

## 2015-09-19 NOTE — Telephone Encounter (Signed)
Needs appointment for surgical claerance

## 2015-09-19 NOTE — Telephone Encounter (Signed)
Appointment scheduled for 12/7 with Ronnald Collum, FNP.

## 2015-09-24 ENCOUNTER — Encounter: Payer: Self-pay | Admitting: Internal Medicine

## 2015-09-24 ENCOUNTER — Ambulatory Visit (INDEPENDENT_AMBULATORY_CARE_PROVIDER_SITE_OTHER): Payer: Medicare Other | Admitting: Internal Medicine

## 2015-09-24 DIAGNOSIS — M009 Pyogenic arthritis, unspecified: Secondary | ICD-10-CM

## 2015-09-24 LAB — C-REACTIVE PROTEIN: CRP: 1.1 mg/dL — ABNORMAL HIGH (ref <0.60)

## 2015-09-24 NOTE — Progress Notes (Signed)
[place] for Infectious Disease  Patient Active Problem List   Diagnosis Date Noted  . Septic arthritis of knee, left ([place]) 06/20/2015    Priority: High  . BMI 28.0-28.9,adult 08/13/2015  . Pyogenic arthritis of left knee joint ([place])   . Recurrent infections   . Septic joint of left knee joint ([place]) 07/20/2015  . Epistaxis 05/20/2015  . Bleeding nose 05/20/2015  . Gastroesophageal reflux disease without esophagitis 05/13/2015  . Hypertension 08/31/2013  . TIA (transient ischemic attack) 08/31/2013    Patient's Medications  New Prescriptions   No medications on file  Previous Medications   ACETAMINOPHEN (TYLENOL) 325 MG TABLET    Take 2 tablets (650 mg total) by mouth every 6 (six) hours as needed for mild pain (or Fever >/= 101).   CLOPIDOGREL (PLAVIX) 75 MG TABLET    Take 1 tablet (75 mg total) by mouth daily.   OLMESARTAN-HYDROCHLOROTHIAZIDE (BENICAR HCT) 20-12.5 MG PER TABLET    Take 1 tablet by mouth daily.   POLYETHYLENE GLYCOL (MIRALAX / GLYCOLAX) PACKET    Take 17 g by mouth daily as needed for mild constipation.  Modified Medications   No medications on file  Discontinued Medications   LEVOFLOXACIN (LEVAQUIN) 500 MG TABLET    Take 1 tablet (500 mg total) by mouth daily.    Subjective: Mr. Beahan is in with his daughter for his routine follow-up visit. He completed 8 weeks of empiric antibiotic therapy 2 weeks ago for his recurrent culture negative left knee septic arthritis. He is doing better. He is having minimal pain and is not requiring any pain medications. He has not noted any fever, chills or sweats. For the past 3 weeks he has noted a firm swelling on the medial side of his left knee. It has not changed during that time. His right shoulder pain is also improving. He has discussed having right shoulder surgery with Dr. Lennette Bihari supple but states that he would like to postpone it to see if he continues to improve. He is worried about getting recurrent  infections that could complicate his shoulder surgery.  Review of Systems: Review of Systems  Constitutional: Negative for fever, chills and diaphoresis.  Gastrointestinal: Negative for nausea, vomiting and diarrhea.  Musculoskeletal: Negative for joint pain.  Psychiatric/Behavioral: Negative for depression. The patient is not nervous/anxious.     Past Medical History  Diagnosis Date  . Hypertension   . Abdominal hernia     "in there now" (05/20/2015)  . TIA (transient ischemic attack) 2004  . Arthritis     "terrible; all over" (05/20/2015)  . Prostate cancer ([place])   . Basal cell carcinoma     "left neck"  . Epistaxis 05/20/2015    hospitalized    Social History  Substance Use Topics  . Smoking status: Former Smoker -- 1.00 packs/day for 4 years    Types: Cigarettes    Quit date: 08/29/2008  . Smokeless tobacco: Never Used  . Alcohol Use: No    No family history on file.   Objective: Filed Vitals:   09/24/15 1026  BP: 116/81  Pulse: 98  Temp: 98 F (36.7 C)  Height: 5\' 8"  (1.727 m)  Weight: 188 lb (85.276 kg)   Body mass index is 28.59 kg/(m^2).  Physical Exam  Constitutional:  He is well dressed and in no distress.  Musculoskeletal:  His left knee incision is well healed. There is no redness, warmth or swelling. He has  good range of motion without pain. There is a firm nodule on his left knee medially that feels like a possible stitch.  He is doing better with his right shoulder. There is no redness, warmth or swelling. He has improved abduction.  Skin: No rash noted.  Psychiatric: Mood and affect normal.    Lab Results SED RATE (mm/hr)  Date Value  08/22/2015 5  07/20/2015 11  06/19/2015 34*   CRP (mg/dL)  Date Value  08/22/2015 2.2*  07/20/2015 7.1*  06/19/2015 13.3*     Problem List Items Addressed This Visit      High   Septic arthritis of knee, left (HCC)    I am hopeful that his recurrent septic arthritis has been cured but would  recommend continued observation off of antibiotics to be sure. I will repeat inflammatory markers today and see him back in one month. I agree with postponing right shoulder surgery for now.          Michel Bickers, MD [facility] for [place] [facility] [phone] pager   [phone] cell 09/24/2015, 10:43 AM

## 2015-09-24 NOTE — Assessment & Plan Note (Signed)
I am hopeful that his recurrent septic arthritis has been cured but would recommend continued observation off of antibiotics to be sure. I will repeat inflammatory markers today and see him back in one month. I agree with postponing right shoulder surgery for now.

## 2015-09-25 ENCOUNTER — Ambulatory Visit: Payer: Medicare Other | Admitting: Nurse Practitioner

## 2015-09-25 LAB — SEDIMENTATION RATE: SED RATE: 1 mm/h (ref 0–20)

## 2015-09-26 ENCOUNTER — Encounter: Payer: Self-pay | Admitting: Nurse Practitioner

## 2015-10-02 ENCOUNTER — Encounter (HOSPITAL_COMMUNITY): Payer: Self-pay | Admitting: Emergency Medicine

## 2015-10-02 NOTE — Progress Notes (Signed)
Pt stated that Infectious disease stated that he shouldn't have the shoulder surgery while he is still under their care. LVM with Abigail Butts, surgical scheduler, regarding pt cancellation and note in Epic from infectious disease on 09/24/15.

## 2015-10-03 ENCOUNTER — Encounter (HOSPITAL_COMMUNITY): Admission: RE | Payer: Self-pay | Source: Ambulatory Visit

## 2015-10-03 ENCOUNTER — Ambulatory Visit (HOSPITAL_COMMUNITY): Admission: RE | Admit: 2015-10-03 | Payer: Medicare Other | Source: Ambulatory Visit | Admitting: Orthopedic Surgery

## 2015-10-03 SURGERY — SHOULDER ARTHROSCOPY WITH ROTATOR CUFF REPAIR AND SUBACROMIAL DECOMPRESSION
Anesthesia: Regional | Site: Shoulder | Laterality: Right

## 2015-10-17 ENCOUNTER — Encounter: Payer: Self-pay | Admitting: Nurse Practitioner

## 2015-10-17 ENCOUNTER — Ambulatory Visit (INDEPENDENT_AMBULATORY_CARE_PROVIDER_SITE_OTHER): Payer: Medicare Other | Admitting: Nurse Practitioner

## 2015-10-17 VITALS — BP 126/88 | HR 94 | Temp 96.6°F | Ht 68.0 in | Wt 186.0 lb

## 2015-10-17 DIAGNOSIS — R42 Dizziness and giddiness: Secondary | ICD-10-CM | POA: Diagnosis not present

## 2015-10-17 NOTE — Progress Notes (Signed)
   Subjective:    Patient ID: [name], male    DOB: 30-Apr-1946, 69 y.o.   MRN: [id number]  HPI Patient in today c/o dizziness. Started 2-3 days ago- worse in AM. Says that spells come randomly- can just be sitting and will get dizzy. Takes meclizine and falls asleep and goes away.    Review of Systems  Constitutional: Negative.   HENT: Negative.   Cardiovascular: Negative.   Genitourinary: Negative.   Neurological: Positive for dizziness. Negative for seizures, speech difficulty, weakness, light-headedness and headaches.  Psychiatric/Behavioral: Negative.   All other systems reviewed and are negative.      Objective:   Physical Exam  Constitutional: He is oriented to person, place, and time. He appears well-developed and well-nourished.  Cardiovascular: Normal rate, regular rhythm and normal heart sounds.   Pulmonary/Chest: Effort normal and breath sounds normal.  Neurological: He is alert and oriented to person, place, and time. He has normal reflexes. No cranial nerve deficit.  (-) romberg  Skin: Skin is warm.  Psychiatric: He has a normal mood and affect. His behavior is normal. Judgment and thought content normal.    BP 126/88 mmHg  Pulse 94  Temp(Src) 96.6 F (35.9 C) (Oral)  Ht 5\' 8"  (1.727 m)  Wt 186 lb (84.369 kg)  BMI 28.29 kg/m2       Assessment & Plan:   1. Vertigo    Continue meclizine as rx Force fluids If still occurring Monday of next week will do neuro referral  [place], FNP

## 2015-10-17 NOTE — Patient Instructions (Signed)

## 2015-10-22 ENCOUNTER — Encounter: Payer: Self-pay | Admitting: Internal Medicine

## 2015-10-27 ENCOUNTER — Other Ambulatory Visit: Payer: Self-pay | Admitting: Nurse Practitioner

## 2015-11-05 ENCOUNTER — Ambulatory Visit (INDEPENDENT_AMBULATORY_CARE_PROVIDER_SITE_OTHER): Payer: Medicare Other | Admitting: Internal Medicine

## 2015-11-05 ENCOUNTER — Encounter: Payer: Self-pay | Admitting: Internal Medicine

## 2015-11-05 VITALS — BP 141/85 | HR 86 | Temp 98.1°F | Wt 193.5 lb

## 2015-11-05 DIAGNOSIS — M00862 Arthritis due to other bacteria, left knee: Secondary | ICD-10-CM | POA: Diagnosis not present

## 2015-11-05 NOTE — Assessment & Plan Note (Signed)
It appears that his septic arthritis has now been cured. He can follow-up here as needed.

## 2015-11-05 NOTE — Progress Notes (Signed)
[facility]  Patient Active Problem List   Diagnosis Date Noted  . Septic arthritis of knee, left ([place]) 06/20/2015    Priority: High  . BMI 28.0-28.9,adult 08/13/2015  . Pyogenic arthritis of left knee joint ([place])   . Recurrent infections   . Septic joint of left knee joint ([place]) 07/20/2015  . Epistaxis 05/20/2015  . Bleeding nose 05/20/2015  . Gastroesophageal reflux disease without esophagitis 05/13/2015  . Hypertension 08/31/2013  . TIA (transient ischemic attack) 08/31/2013    Patient's Medications  New Prescriptions   No medications on file  Previous Medications   ACETAMINOPHEN (TYLENOL) 325 MG TABLET    Take 2 tablets (650 mg total) by mouth every 6 (six) hours as needed for mild pain (or Fever >/= 101).   CLOPIDOGREL (PLAVIX) 75 MG TABLET    Take 1 tablet (75 mg total) by mouth daily.   CLOPIDOGREL (PLAVIX) 75 MG TABLET    TAKE ONE TABLET BY MOUTH ONCE DAILY   OLMESARTAN-HYDROCHLOROTHIAZIDE (BENICAR HCT) 20-12.5 MG PER TABLET    Take 1 tablet by mouth daily.   POLYETHYLENE GLYCOL (MIRALAX / GLYCOLAX) PACKET    Take 17 g by mouth daily as needed for mild constipation.  Modified Medications   No medications on file  Discontinued Medications   No medications on file    Subjective: Timothy Duran is in for his routine follow-up. He was hospitalized 2 times last year with culture-negative septic arthritis of his left knee and ankle. He relapsed shortly after completing his first round of antibiotic therapy. He completed a second round of antibiotic therapy lasting 8 weeks 2 months ago. He continues to do well without any swelling, pain or redness of his left knee.  Review of Systems: Review of Systems  Constitutional: Negative for fever.  Musculoskeletal: Positive for joint pain.       He is not having pain in his knee or ankle but continues to be bothered by right shoulder pain. He has been considering surgery. His wife states that he can  now abduct his right arm and bring it up horizontally.    Past Medical History  Diagnosis Date  . Hypertension   . Abdominal hernia     "in there now" (05/20/2015)  . TIA (transient ischemic attack) 2004  . Arthritis     "terrible; all over" (05/20/2015)  . Prostate cancer ([place])   . Basal cell carcinoma     "left neck"  . Epistaxis 05/20/2015    hospitalized    Social History  Substance Use Topics  . Smoking status: Former Smoker -- 1.00 packs/day for 4 years    Types: Cigarettes    Quit date: 08/29/2008  . Smokeless tobacco: Never Used  . Alcohol Use: No    No family history on file.   Objective: Filed Vitals:   11/05/15 0942  BP: 141/85  Pulse: 86  Temp: 98.1 F (36.7 C)  TempSrc: Oral  Weight: 193 lb 8 oz (87.771 kg)   Body mass index is 29.43 kg/(m^2).  Physical Exam  Constitutional:  He is well dressed and in good spirits.  Musculoskeletal: Normal range of motion. He exhibits no edema or tenderness.  His left knee incision is well-healed. He has no swelling, warmth, redness or pain.  Skin: No rash noted.  Psychiatric: Mood and affect normal.    Lab Results    Problem List Items Addressed This Visit  High   Septic arthritis of knee, left (HCC) - Primary    It appears that his septic arthritis has now been cured. He can follow-up here as needed.          Timothy Bickers, MD [facility] [phone] pager   [phone] cell 11/05/2015, 9:56 AM

## 2015-11-20 ENCOUNTER — Telehealth: Payer: Self-pay | Admitting: Nurse Practitioner

## 2015-11-20 HISTORY — PX: SHOULDER SURGERY: SHX246

## 2015-11-20 NOTE — Telephone Encounter (Signed)
Patient aware and verbalizes understanding. 

## 2015-11-20 NOTE — Telephone Encounter (Signed)
Stop 3 days prior  To surgery

## 2015-12-10 ENCOUNTER — Ambulatory Visit: Payer: Medicare Other | Attending: Orthopedic Surgery | Admitting: Physical Therapy

## 2015-12-10 ENCOUNTER — Encounter: Payer: Self-pay | Admitting: Physical Therapy

## 2015-12-10 DIAGNOSIS — M25511 Pain in right shoulder: Secondary | ICD-10-CM | POA: Insufficient documentation

## 2015-12-10 DIAGNOSIS — R5381 Other malaise: Secondary | ICD-10-CM

## 2015-12-10 DIAGNOSIS — R29898 Other symptoms and signs involving the musculoskeletal system: Secondary | ICD-10-CM | POA: Insufficient documentation

## 2015-12-10 DIAGNOSIS — M25611 Stiffness of right shoulder, not elsewhere classified: Secondary | ICD-10-CM

## 2015-12-10 NOTE — Therapy (Signed)
[facility] [place], [place], [address] Phone: [phone]   Fax:  [phone]  Physical Therapy Evaluation  Patient Details  Name: Timothy Duran MRN: [id number] Date of Birth: 01/15/1946 Referring Provider: Justice Britain MD  Encounter Date: 12/10/2015      PT End of Session - 12/10/15 0943    Visit Number 1   Number of Visits 12   Date for PT Re-Evaluation 01/21/16   PT Start Time 0913   PT Stop Time 0956   PT Time Calculation (min) 43 min   Activity Tolerance Patient tolerated treatment well   Behavior During Therapy [facility] for tasks assessed/performed      Past Medical History  Diagnosis Date  . Hypertension   . Abdominal hernia     "in there now" (05/20/2015)  . TIA (transient ischemic attack) 2004  . Arthritis     "terrible; all over" (05/20/2015)  . Prostate cancer ([place])   . Basal cell carcinoma     "left neck"  . Epistaxis 05/20/2015    hospitalized    Past Surgical History  Procedure Laterality Date  . Video assisted thoracoscopy (vats)/wedge resection Right 10/2009    "upper; benign"  . Prostatectomy  01/2010  . Cystoscopy w/ stone manipulation  1994    "got one stone; still has one lodge in there" (05/20/2015)  . Bladder stone removal  09/2009  . Basal cell carcinoma excision Left 2015    "neck"  . Nasal endoscopy with epistaxis control Left 05/21/2015    Procedure: NASAL ENDOSCOPY WITH EPISTAXIS CONTROL;  Surgeon: Melissa Montane, MD;  Location: [facility];  Service: ENT;  Laterality: Left;  . Incision and drainage abscess Left 06/20/2015    Procedure: INCISION AND DRAINAGE ABSCESS;  Surgeon: Melina Schools, MD;  Location: [facility];  Service: Orthopedics;  Laterality: Left;  . Knee arthrotomy Left 07/20/2015    Procedure: KNEE ARTHROTOMY Incision and Drainage left knee;  Surgeon: Paralee Cancel, MD;  Location: [facility];  Service: Orthopedics;  Laterality: Left;    There were no vitals filed for this visit.  Visit Diagnosis:   Decreased ROM of right shoulder - Plan: PT plan of care cert/re-cert  Pain in right shoulder - Plan: PT plan of care cert/re-cert  Debility - Plan: PT plan of care cert/re-cert  Weakness of right upper extremity - Plan: PT plan of care cert/re-cert      Subjective Assessment - 12/10/15 0908    Subjective Patient had R shoulder surgery 11/26/15. He presents in a sling. Patient had not had much pain until last weekend when he awoke with his arm under him. Patient brought a note from PA stating that he recently developed local swelling at superior portal which is likely a hematoma and we should monitor with patient.    Pertinent History HTN, Knee arthotomy   Patient Stated Goals decrease pain, improve strength, raise arm   Currently in Pain? Yes   Pain Score 4    Pain Location Shoulder   Pain Orientation Right   Pain Descriptors / Indicators Aching   Pain Type Surgical pain   Pain Onset 1 to 4 weeks ago   Pain Frequency Constant   Aggravating Factors  use   Pain Relieving Factors meds   Effect of Pain on Daily Activities can't raise arm            [facility] PT Assessment - 12/10/15 0001    Assessment   Medical Diagnosis S/P R shoulder arthroscopy  SAD, DCR   Referring Provider Justice Britain MD   Onset Date/Surgical Date 11/26/15   Hand Dominance Right   Next MD Visit 01/01/16   Precautions   Precaution Comments patient wearing sling for comfort only   Balance Screen   Has the patient fallen in the past 6 months No   Has the patient had a decrease in activity level because of a fear of falling?  No   Is the patient reluctant to leave their home because of a fear of falling?  No   Prior Function   Level of Independence Independent   Vocation Full time employment   Vocation Requirements desk work   Observation/Other Assessments   Observations patient has Lipoma on R posterior shoulder which appears as swelling, but has had since 2010.   Focus on Therapeutic Outcomes (FOTO)  49%  limited   ROM / Strength   AROM / PROM / Strength AROM;PROM;Strength   AROM   AROM Assessment Site Shoulder   Right/Left Shoulder Right   PROM   PROM Assessment Site Shoulder   Right/Left Shoulder Right   Right Shoulder Flexion 89 Degrees   Right Shoulder ABduction 76 Degrees   Right Shoulder Internal Rotation 54 Degrees  at 40 deg abd   Right Shoulder External Rotation 5 Degrees  at 40 deg ABD   Strength   Overall Strength Comments R shoulder at least 4/5 but didn't do full MMT   Palpation   Palpation comment tender of R pecs, deltoid, UT                   [facility] Adult PT Treatment/Exercise - 12/10/15 0001    Modalities   Modalities Electrical Stimulation   Electrical Stimulation   Electrical Stimulation Location R shoulder IFC x 15 min to tolerance 80-150hz    Electrical Stimulation Goals Pain                PT Education - 12/10/15 1215    Education provided Yes   Education Details HEP   Person(s) Educated Patient   Methods Explanation;Demonstration;Handout;Tactile cues;Verbal cues   Comprehension Verbalized understanding;Returned demonstration          PT Short Term Goals - 12/10/15 1221    PT SHORT TERM GOAL #1   Title I with initial HEP (12/24/15)   Time 2   Period Weeks   Status New   PT SHORT TERM GOAL #2   Title decreased pain in R shoulder to 2/10 with ADLSl. ([id number])   Time 2   Period Weeks   Status New           PT Long Term Goals - 12/10/15 1220    PT LONG TERM GOAL #1   Title I with advanced HEP   Time 6   Period Weeks   Status New   PT LONG TERM GOAL #2   Title improved R shoulder to [facility] to peform ADLs.   Time 6   Period Weeks   Status New   PT LONG TERM GOAL #3   Title 4+/5 to 5/5 R shoulder strength to improve function.   Time 6   Period Weeks   Status New   PT LONG TERM GOAL #4   Title able to peform ADLs with 1/10 or less shoulder pain   Time 6   Period Weeks   Status New               Plan -  12/10/15 1215  Clinical Impression Statement Patient presents 2 weeks s/p R shoulder arthroscopy, SAD and DCE. He has been d/c'd from sling, but wears sometimes for comfort. He demos poor posture with rounded shoulders and forward head. He was doing well, but sleeping on shoulder last weekend increased the pain and his ROM has decreased affecting ADLS. Hematoma at superior portal should be monitored.   Pt will benefit from skilled therapeutic intervention in order to improve on the following deficits Decreased range of motion;Impaired UE functional use;Decreased activity tolerance;Pain;Decreased strength;Postural dysfunction   Rehab Potential Excellent   PT Frequency 2x / week   PT Duration 6 weeks   PT Treatment/Interventions ADLs/Self Care Home Management;Cryotherapy;Electrical Stimulation;Moist Heat;Therapeutic exercise;Neuromuscular re-education;Patient/family education;Manual techniques;Vasopneumatic Device;Passive range of motion   PT Next Visit Plan Pulleys, AAROM, manual/stw and modalities for pain; yellow tband as tolerated.   PT Home Exercise Plan seated flexion/abd on table, cane for ER   Consulted and Agree with Plan of Care Patient          G-Codes - December 31, 2015 1224    Functional Assessment Tool Used FOTO 49% LIMITED   Functional Limitation Carrying, moving and handling objects   Carrying, Moving and Handling Objects Current Status [id number]) At least 40 percent but less than 60 percent impaired, limited or restricted   Carrying, Moving and Handling Objects Goal Status [id number]) At least 20 percent but less than 40 percent impaired, limited or restricted       Problem List Patient Active Problem List   Diagnosis Date Noted  . BMI 28.0-28.9,adult 08/13/2015  . Pyogenic arthritis of left knee joint ([place])   . Recurrent infections   . Septic joint of left knee joint ([place]) 07/20/2015  . Septic arthritis of knee, left ([place]) 06/20/2015  . Epistaxis 05/20/2015  . Bleeding nose  05/20/2015  . Gastroesophageal reflux disease without esophagitis 05/13/2015  . Hypertension 08/31/2013  . TIA (transient ischemic attack) 08/31/2013   Madelyn Flavors PT  12-31-15, 12:26 PM  [facility] [place], [place], [address] Phone: [phone]   Fax:  [phone]  Name: MAKANA POPPEN MRN: [id number] Date of Birth: 22-Dec-1945

## 2015-12-13 ENCOUNTER — Ambulatory Visit: Payer: Medicare Other | Admitting: Physical Therapy

## 2015-12-13 DIAGNOSIS — M25511 Pain in right shoulder: Secondary | ICD-10-CM

## 2015-12-13 DIAGNOSIS — R5381 Other malaise: Secondary | ICD-10-CM

## 2015-12-13 DIAGNOSIS — M25611 Stiffness of right shoulder, not elsewhere classified: Secondary | ICD-10-CM

## 2015-12-13 NOTE — Therapy (Signed)
[facility] [place], [place], [address] Phone: [phone]   Fax:  [phone]  Physical Therapy Treatment  Patient Details  Name: Timothy Duran MRN: [id number] Date of Birth: March 28, 1946 Referring Provider: Justice Britain MD  Encounter Date: 12/13/2015      PT End of Session - 12/13/15 0900    Visit Number 2   Number of Visits 12   Date for PT Re-Evaluation 01/21/16   PT Start Time 0900   PT Stop Time 0959   PT Time Calculation (min) 59 min   Activity Tolerance Patient tolerated treatment well   Behavior During Therapy [facility] for tasks assessed/performed      Past Medical History  Diagnosis Date  . Hypertension   . Abdominal hernia     "in there now" (05/20/2015)  . TIA (transient ischemic attack) 2004  . Arthritis     "terrible; all over" (05/20/2015)  . Prostate cancer ([place])   . Basal cell carcinoma     "left neck"  . Epistaxis 05/20/2015    hospitalized    Past Surgical History  Procedure Laterality Date  . Video assisted thoracoscopy (vats)/wedge resection Right 10/2009    "upper; benign"  . Prostatectomy  01/2010  . Cystoscopy w/ stone manipulation  1994    "got one stone; still has one lodge in there" (05/20/2015)  . Bladder stone removal  09/2009  . Basal cell carcinoma excision Left 2015    "neck"  . Nasal endoscopy with epistaxis control Left 05/21/2015    Procedure: NASAL ENDOSCOPY WITH EPISTAXIS CONTROL;  Surgeon: Melissa Montane, MD;  Location: [facility];  Service: ENT;  Laterality: Left;  . Incision and drainage abscess Left 06/20/2015    Procedure: INCISION AND DRAINAGE ABSCESS;  Surgeon: Melina Schools, MD;  [facility];  Service: Orthopedics;  Laterality: Left;  . Knee arthrotomy Left 07/20/2015    Procedure: KNEE ARTHROTOMY Incision and Drainage left knee;  Surgeon: Paralee Cancel, MD;  [facility];  Service: Orthopedics;  Laterality: Left;    There were no vitals filed for this visit.  Visit Diagnosis:   Decreased ROM of right shoulder  Pain in right shoulder  Debility      Subjective Assessment - 12/13/15 0900    Subjective Patient awoke 1230 last night and shoulder was really hurting. He was in recliner, so he put sling on and it felt better. He states he did his exercises quite a bit yesterday.   Pertinent History HTN, Knee arthotomy   Patient Stated Goals decrease pain, improve strength, raise arm   Currently in Pain? Yes   Pain Score 4    Pain Location Arm   Pain Orientation Right   Pain Type Surgical pain   Pain Onset 1 to 4 weeks ago   Pain Frequency Constant   Aggravating Factors  use   Pain Relieving Factors meds   Effect of Pain on Daily Activities cant' raise arm                         [facility] Adult PT Treatment/Exercise - 12/13/15 0001    Exercises   Exercises Shoulder   Shoulder Exercises: Supine   Horizontal ABduction Strengthening;Both;20 reps;Theraband   Theraband Level (Shoulder Horizontal ABduction) Level 1 (Yellow)   Shoulder Exercises: Seated   Retraction Strengthening;Both;20 reps  then 20 reps with band   Theraband Level (Shoulder Retraction) Level 1 (Yellow)   Row Strengthening;Both;20 reps;Theraband   Theraband  Level (Shoulder Row) Level 1 (Yellow)   External Rotation Strengthening;Both;20 reps  Also with cane x 30   Shoulder Exercises: Pulleys   Flexion 3 minutes   Other Pulley Exercises UE ranger into flexion x 10   Other Pulley Exercises wall ladder x 5 flexion   Shoulder Exercises: ROM/Strengthening   Other ROM/Strengthening Exercises attempted chest press pain with return to chest   Modalities   Modalities Electrical Stimulation   Electrical Stimulation   Electrical Stimulation Location R shoulder IFC x 15 min to tolerance 80-150hz    Electrical Stimulation Goals Pain   Manual Therapy   Manual Therapy Soft tissue mobilization;Passive ROM   Soft tissue mobilization to R pecs, biceps, deltoid at insertion and subscapularis  and teres muscles.   Passive ROM R shoulder into ER and flexion                  PT Short Term Goals - 12/10/15 1221    PT SHORT TERM GOAL #1   Title I with initial HEP (12/24/15)   Time 2   Period Weeks   Status New   PT SHORT TERM GOAL #2   Title decreased pain in R shoulder to 2/10 with ADLSl. ([id number])   Time 2   Period Weeks   Status New           PT Long Term Goals - 12/10/15 1220    PT LONG TERM GOAL #1   Title I with advanced HEP   Time 6   Period Weeks   Status New   PT LONG TERM GOAL #2   Title improved R shoulder to [facility] to peform ADLs.   Time 6   Period Weeks   Status New   PT LONG TERM GOAL #3   Title 4+/5 to 5/5 R shoulder strength to improve function.   Time 6   Period Weeks   Status New   PT LONG TERM GOAL #4   Title able to peform ADLs with 1/10 or less shoulder pain   Time 6   Period Weeks   Status New               Plan - 12/13/15 [id number]    Clinical Impression Statement Patient did very well with therex today and demo'd increased ER already. He reports compliance with HEP. He has marked tenderness of R pecs, upper arm and subscapularis muscles.   PT Treatment/Interventions Visual/perceptual remediation/compensation   PT Next Visit Plan Pulleys, AAROM, manual/stw and modalities for pain; yellow tband as tolerated.        Problem List Patient Active Problem List   Diagnosis Date Noted  . BMI 28.0-28.9,adult 08/13/2015  . Pyogenic arthritis of left knee joint ([place])   . Recurrent infections   . Septic joint of left knee joint ([place]) 07/20/2015  . Septic arthritis of knee, left ([place]) 06/20/2015  . Epistaxis 05/20/2015  . Bleeding nose 05/20/2015  . Gastroesophageal reflux disease without esophagitis 05/13/2015  . Hypertension 08/31/2013  . TIA (transient ischemic attack) 08/31/2013    Madelyn Flavors PT  12/13/2015, 9:58 AM  [facility] [place], [place],  [address] Phone: [phone]   Fax:  [phone]  Name: ADDEN HOLLENBACH MRN: [id number] Date of Birth: 20-Apr-1946

## 2015-12-16 ENCOUNTER — Encounter: Payer: Self-pay | Admitting: Physical Therapy

## 2015-12-16 ENCOUNTER — Ambulatory Visit: Payer: Medicare Other | Admitting: Physical Therapy

## 2015-12-16 DIAGNOSIS — M25611 Stiffness of right shoulder, not elsewhere classified: Secondary | ICD-10-CM

## 2015-12-16 DIAGNOSIS — M25511 Pain in right shoulder: Secondary | ICD-10-CM | POA: Diagnosis not present

## 2015-12-16 DIAGNOSIS — R5381 Other malaise: Secondary | ICD-10-CM

## 2015-12-16 DIAGNOSIS — R29898 Other symptoms and signs involving the musculoskeletal system: Secondary | ICD-10-CM

## 2015-12-16 NOTE — Therapy (Signed)
[facility] [place], [place], [address] Phone: [phone]   Fax:  [phone]  Physical Therapy Treatment  Patient Details  Name: Timothy Duran MRN: [id number] Date of Birth: 06-08-1946 Referring Provider: Justice Britain MD  Encounter Date: 12/16/2015      PT End of Session - 12/16/15 0816    Visit Number 3   Number of Visits 12   Date for PT Re-Evaluation 01/21/16   PT Start Time 0728   PT Stop Time 0831   PT Time Calculation (min) 63 min   Activity Tolerance Patient tolerated treatment well   Behavior During Therapy [facility] for tasks assessed/performed      Past Medical History  Diagnosis Date  . Hypertension   . Abdominal hernia     "in there now" (05/20/2015)  . TIA (transient ischemic attack) 2004  . Arthritis     "terrible; all over" (05/20/2015)  . Prostate cancer ([place])   . Basal cell carcinoma     "left neck"  . Epistaxis 05/20/2015    hospitalized    Past Surgical History  Procedure Laterality Date  . Video assisted thoracoscopy (vats)/wedge resection Right 10/2009    "upper; benign"  . Prostatectomy  01/2010  . Cystoscopy w/ stone manipulation  1994    "got one stone; still has one lodge in there" (05/20/2015)  . Bladder stone removal  09/2009  . Basal cell carcinoma excision Left 2015    "neck"  . Nasal endoscopy with epistaxis control Left 05/21/2015    Procedure: NASAL ENDOSCOPY WITH EPISTAXIS CONTROL;  Surgeon: Melissa Montane, MD;  Location: [facility];  Service: ENT;  Laterality: Left;  . Incision and drainage abscess Left 06/20/2015    Procedure: INCISION AND DRAINAGE ABSCESS;  Surgeon: Melina Schools, MD;  [facility]: [facility];  Service: Orthopedics;  Laterality: Left;  . Knee arthrotomy Left 07/20/2015    Procedure: KNEE ARTHROTOMY Incision and Drainage left knee;  Surgeon: Paralee Cancel, MD;  Location: [facility];  Service: Orthopedics;  Laterality: Left;    There were no vitals filed for this visit.  Visit Diagnosis:   Pain in right shoulder  Decreased ROM of right shoulder  Debility  Weakness of right upper extremity      Subjective Assessment - 12/16/15 0731    Subjective Patient reported some soreness esp after laying on it a few weeks ago. No complaints after last treatment. Doing HEP daily per patient.   Pertinent History HTN, Knee arthotomy   Patient Stated Goals decrease pain, improve strength, raise arm   Currently in Pain? Yes   Pain Score 4    Pain Location Arm   Pain Orientation Right   Pain Descriptors / Indicators Aching   Pain Type Surgical pain   Pain Onset 1 to 4 weeks ago   Pain Frequency Constant   Aggravating Factors  movement of arm overhead   Pain Relieving Factors rest and meds            [facility] PT Assessment - 12/16/15 0001    Assessment   Medical Diagnosis .   ROM / Strength   AROM / PROM / Strength PROM;AROM   AROM   AROM Assessment Site Shoulder   Right/Left Shoulder Right   Right Shoulder External Rotation 10 Degrees   PROM   PROM Assessment Site Shoulder   Right/Left Shoulder Right   Right Shoulder Flexion 100 Degrees   Right Shoulder External Rotation 20 Degrees  [facility] Adult PT Treatment/Exercise - 12/16/15 0001    Shoulder Exercises: Seated   Extension --   Theraband Level (Shoulder Extension) --   Row --   Theraband Level (Shoulder Row) --   External Rotation --   Theraband Level (Shoulder External Rotation) --   Internal Rotation --   Theraband Level (Shoulder Internal Rotation) --   Other Seated Exercises cane for flexion and chest press 3x10 each   Shoulder Exercises: Sidelying   External Rotation Strengthening;Right;Weights  3x10   Shoulder Exercises: Standing   External Rotation Strengthening;Right;Theraband  2x10   Internal Rotation Strengthening;Right;Theraband  2x10   Theraband Level (Shoulder Internal Rotation) Level 1 (Yellow)   Extension Strengthening;Right;Theraband  2x10   Theraband Level  (Shoulder Extension) Level 1 (Yellow)   Row Strengthening;Right;Theraband  2x10   Theraband Level (Shoulder Retraction) --  Pink XTS retractions 2x10   Shoulder Exercises: Pulleys   Flexion --  50mn   Other Pulley Exercises UE ranger for elevation and circles 2x10 each   Shoulder Exercises: ROM/Strengthening   UBE (Upper Arm Bike) x688m 120 RPM   Modalities   Modalities Electrical Stimulation;Vasopneumatic   Electrical Stimulation   Electrical Stimulation Location right shoulder   Electrical Stimulation Action premod   Electrical Stimulation Parameters 1-_0    Electrical Stimulation Goals Pain   Vasopneumatic   Number Minutes Vasopneumatic  15 minutes   Vasopnuematic Location  Shoulder   Vasopneumatic Pressure Medium   Manual Therapy   Manual Therapy Passive ROM   Passive ROM PROM for right shoulder flexion and ER with genle range and holds, then rhythmic stabilization for ER/IR in acaption and flex/ext at 90 degrees                  PT Short Term Goals - 12/16/15 089150  PT SHORT TERM GOAL #1   Title I with initial HEP (12/24/15)   Time 2   Period Weeks   Status Achieved   PT SHORT TERM GOAL #2   Title decreased pain in R shoulder to 2/10 with ADLSl. (3/02/22/96  Time 2   Period Weeks   Status On-going           PT Long Term Goals - 12/16/15 089480  PT LONG TERM GOAL #1   Title I with advanced HEP   Time 6   Period Weeks   Status On-going   PT LONG TERM GOAL #2   Title improved [facility] peform AD[facility].   Time 6   Period Weeks   Status On-going   PT LONG TERM GOAL #3   Title 4+/5 to 5/5 R shoulder strength to improve function.   Time 6   Period Weeks   Status On-going   PT LONG TERM GOAL #4   Title able to peform ADLs with 1/10 or less shoulder pain   Time 6   Period Weeks   Status On-going               Plan - 12/16/15 081655  Clinical Impression Statement Patient progressing with good understanding of exercises and has no  difficulty or pain with activities today. Patient has limited ROM today and soreness with PROM in supine. Patient has difficulty with overhead movement and ADL' s at this time. Patient needs a lot of work with manual ROM . Patient met STG #1 today being independent with initial HEP. Other golas ongoing due to pain, ROM and strength deficits. Patient  felt better after treatment with no pain reports.   Pt will benefit from skilled therapeutic intervention in order to improve on the following deficits Decreased range of motion;Impaired UE functional use;Decreased activity tolerance;Pain;Decreased strength;Postural dysfunction   Rehab Potential Excellent   PT Frequency 2x / week   PT Duration 6 weeks   PT Treatment/Interventions ADLs/Self Care Home Management;Electrical Stimulation;Moist Heat;Therapeutic exercise;Neuromuscular re-education;Patient/family education;Manual techniques;Vasopneumatic Device;Other (comment);Passive range of motion   PT Next Visit Plan cont with POC for manual ROM and low level ther ex   Consulted and Agree with Plan of Care Patient        Problem List Patient Active Problem List   Diagnosis Date Noted  . BMI 28.0-28.9,adult 08/13/2015  . Pyogenic arthritis of left knee joint ([place])   . Recurrent infections   . Septic joint of left knee joint ([place]) 07/20/2015  . Septic arthritis of knee, left ([place]) 06/20/2015  . Epistaxis 05/20/2015  . Bleeding nose 05/20/2015  . Gastroesophageal reflux disease without esophagitis 05/13/2015  . Hypertension 08/31/2013  . TIA (transient ischemic attack) 08/31/2013    DUNFORD, CHRISTINA P, PTA 12/16/2015, 8:36 AM  [facility] [place], [place], [address] Phone: [phone]   Fax:  [phone]  Name: Timothy Duran MRN: [id number] Date of Birth: 02/15/1946

## 2015-12-20 ENCOUNTER — Ambulatory Visit: Payer: Medicare Other | Attending: Orthopedic Surgery | Admitting: Physical Therapy

## 2015-12-20 DIAGNOSIS — R29898 Other symptoms and signs involving the musculoskeletal system: Secondary | ICD-10-CM | POA: Diagnosis present

## 2015-12-20 DIAGNOSIS — R5381 Other malaise: Secondary | ICD-10-CM | POA: Diagnosis present

## 2015-12-20 DIAGNOSIS — M25511 Pain in right shoulder: Secondary | ICD-10-CM | POA: Insufficient documentation

## 2015-12-20 DIAGNOSIS — M25611 Stiffness of right shoulder, not elsewhere classified: Secondary | ICD-10-CM

## 2015-12-20 NOTE — Therapy (Signed)
[facility] [place], [place], [address] Phone: [phone]   Fax:  [phone]  Physical Therapy Treatment  Patient Details  Name: Timothy Duran MRN: [id number] Date of Birth: 04-22-1946 Referring Provider: Justice Britain MD  Encounter Date: 12/20/2015      PT End of Session - 12/20/15 0816    Visit Number 4   Number of Visits 12   Date for PT Re-Evaluation 01/21/16   PT Start Time 0816   PT Stop Time 0915   PT Time Calculation (min) 59 min   Activity Tolerance Patient tolerated treatment well   Behavior During Therapy [facility] for tasks assessed/performed      Past Medical History  Diagnosis Date  . Hypertension   . Abdominal hernia     "in there now" (05/20/2015)  . TIA (transient ischemic attack) 2004  . Arthritis     "terrible; all over" (05/20/2015)  . Prostate cancer ([place])   . Basal cell carcinoma     "left neck"  . Epistaxis 05/20/2015    hospitalized    Past Surgical History  Procedure Laterality Date  . Video assisted thoracoscopy (vats)/wedge resection Right 10/2009    "upper; benign"  . Prostatectomy  01/2010  . Cystoscopy w/ stone manipulation  1994    "got one stone; still has one lodge in there" (05/20/2015)  . Bladder stone removal  09/2009  . Basal cell carcinoma excision Left 2015    "neck"  . Nasal endoscopy with epistaxis control Left 05/21/2015    Procedure: NASAL ENDOSCOPY WITH EPISTAXIS CONTROL;  Surgeon: Melissa Montane, MD;  Location: [facility];  Service: ENT;  Laterality: Left;  . Incision and drainage abscess Left 06/20/2015    Procedure: INCISION AND DRAINAGE ABSCESS;  Surgeon: Melina Schools, MD;  [facility];  Service: Orthopedics;  Laterality: Left;  . Knee arthrotomy Left 07/20/2015    Procedure: KNEE ARTHROTOMY Incision and Drainage left knee;  Surgeon: Paralee Cancel, MD;  [facility];  Service: Orthopedics;  Laterality: Left;    There were no vitals filed for this visit.  Visit Diagnosis:   Pain in right shoulder  Decreased ROM of right shoulder  Debility  Weakness of right upper extremity      Subjective Assessment - 12/20/15 0817    Subjective Patient feels he is doing better. "It's a lot better than it was."   Pertinent History HTN, Knee arthotomy   Currently in Pain? Yes   Pain Score 3    Pain Location Arm   Pain Orientation Right   Pain Descriptors / Indicators Aching   Pain Type Surgical pain                         [facility] Adult PT Treatment/Exercise - 12/20/15 0001    Shoulder Exercises: Supine   Flexion AAROM;20 reps   Other Supine Exercises cane: chest press x 20   Shoulder Exercises: Standing   Protraction Strengthening;Right;20 reps;Theraband   Theraband Level (Shoulder Protraction) Level 1 (Yellow)   External Rotation Strengthening;Right;Theraband  2x10   Theraband Level (Shoulder External Rotation) Level 2 (Red)   Internal Rotation Strengthening;Right;Theraband  2x10   Theraband Level (Shoulder Internal Rotation) Level 2 (Red)   Flexion Strengthening;Right;20 reps;Theraband   Theraband Level (Shoulder Flexion) Level 1 (Yellow)   Extension Strengthening;Right;Theraband  2x10   Theraband Level (Shoulder Extension) Level 2 (Red)   Row Strengthening;Right;Theraband;20 reps  2x10   Theraband Level (Shoulder Row) Level  2 (Red)   Retraction Strengthening;Right;20 reps   Theraband Level (Shoulder Retraction) Level 2 (Red)   Shoulder Exercises: Pulleys   Flexion 3 minutes   Other Pulley Exercises UE ranger for elevation and circles 2x10 each   Shoulder Exercises: ROM/Strengthening   UBE (Upper Arm Bike) x 6 min at 120 rpm   Electrical Stimulation   Electrical Stimulation Location right shoulder   Electrical Stimulation Action IFC   Electrical Stimulation Parameters 80-150 Hz to tolerance x 15 min   Electrical Stimulation Goals Pain   Manual Therapy   Manual Therapy Passive ROM;Myofascial release   Soft tissue mobilization to R  teres major/minor and lats   Myofascial Release TP release to subscapularis   Passive ROM to R shoulder into flexion                  PT Short Term Goals - 12/16/15 [id number]    PT SHORT TERM GOAL #1   Title I with initial HEP (12/24/15)   Time 2   Period Weeks   Status Achieved   PT SHORT TERM GOAL #2   Title decreased pain in R shoulder to 2/10 with ADLSl. ([id number])   Time 2   Period Weeks   Status On-going           PT Long Term Goals - 12/16/15 [id number]    PT LONG TERM GOAL #1   Title I with advanced HEP   Time 6   Period Weeks   Status On-going   PT LONG TERM GOAL #2   Title improved R shoulder to [facility] to peform ADLs.   Time 6   Period Weeks   Status On-going   PT LONG TERM GOAL #3   Title 4+/5 to 5/5 R shoulder strength to improve function.   Time 6   Period Weeks   Status On-going   PT LONG TERM GOAL #4   Title able to peform ADLs with 1/10 or less shoulder pain   Time 6   Period Weeks   Status On-going               Plan - 12/20/15 0827    Clinical Impression Statement Patient demos marked tightness and tenderness to R teres major/minor and lats as well as R subscapularis. Edema in R shoulder has subsided so changed estim setting to address pain vs. edema. Held vaso.   PT Next Visit Plan Continue STW/MFR to R lateral scapular muscles and subscap. Cont ROM.. Add prone scapular strenghening. modaliities for pain.        Problem List Patient Active Problem List   Diagnosis Date Noted  . BMI 28.0-28.9,adult 08/13/2015  . Pyogenic arthritis of left knee joint ([place])   . Recurrent infections   . Septic joint of left knee joint ([place]) 07/20/2015  . Septic arthritis of knee, left ([place]) 06/20/2015  . Epistaxis 05/20/2015  . Bleeding nose 05/20/2015  . Gastroesophageal reflux disease without esophagitis 05/13/2015  . Hypertension 08/31/2013  . TIA (transient ischemic attack) 08/31/2013    Madelyn Flavors PT  12/20/2015, 9:11 AM  [facility] [place], [place], [address] Phone: [phone]   Fax:  [phone]  Name: Timothy Duran MRN: [id number] Date of Birth: 1946-02-22

## 2015-12-23 ENCOUNTER — Ambulatory Visit: Payer: Medicare Other | Admitting: Physical Therapy

## 2015-12-23 DIAGNOSIS — M25611 Stiffness of right shoulder, not elsewhere classified: Secondary | ICD-10-CM

## 2015-12-23 DIAGNOSIS — R29898 Other symptoms and signs involving the musculoskeletal system: Secondary | ICD-10-CM

## 2015-12-23 DIAGNOSIS — R5381 Other malaise: Secondary | ICD-10-CM

## 2015-12-23 DIAGNOSIS — M25511 Pain in right shoulder: Secondary | ICD-10-CM

## 2015-12-23 NOTE — Therapy (Signed)
[facility] [place], [place], [address] Phone: [phone]   Fax:  [phone]  Physical Therapy Treatment  Patient Details  Name: Timothy Duran MRN: [id number] Date of Birth: 04/02/1946 Referring Provider: Justice Britain MD  Encounter Date: 12/23/2015      PT End of Session - 12/23/15 0907    Visit Number 5   Number of Visits 12   Date for PT Re-Evaluation 01/21/16   PT Start Time 0902   PT Stop Time 0954   PT Time Calculation (min) 52 min   Activity Tolerance Patient tolerated treatment well   Behavior During Therapy [facility] for tasks assessed/performed      Past Medical History  Diagnosis Date  . Hypertension   . Abdominal hernia     "in there now" (05/20/2015)  . TIA (transient ischemic attack) 2004  . Arthritis     "terrible; all over" (05/20/2015)  . Prostate cancer ([place])   . Basal cell carcinoma     "left neck"  . Epistaxis 05/20/2015    hospitalized    Past Surgical History  Procedure Laterality Date  . Video assisted thoracoscopy (vats)/wedge resection Right 10/2009    "upper; benign"  . Prostatectomy  01/2010  . Cystoscopy w/ stone manipulation  1994    "got one stone; still has one lodge in there" (05/20/2015)  . Bladder stone removal  09/2009  . Basal cell carcinoma excision Left 2015    "neck"  . Nasal endoscopy with epistaxis control Left 05/21/2015    Procedure: NASAL ENDOSCOPY WITH EPISTAXIS CONTROL;  Surgeon: Melissa Montane, MD;  Location: [facility];  Service: ENT;  Laterality: Left;  . Incision and drainage abscess Left 06/20/2015    Procedure: INCISION AND DRAINAGE ABSCESS;  Surgeon: Melina Schools, MD;  [facility];  Service: Orthopedics;  Laterality: Left;  . Knee arthrotomy Left 07/20/2015    Procedure: KNEE ARTHROTOMY Incision and Drainage left knee;  Surgeon: Paralee Cancel, MD;  [facility];  Service: Orthopedics;  Laterality: Left;    There were no vitals filed for this visit.  Visit Diagnosis:   Pain in right shoulder  Decreased ROM of right shoulder  Debility  Weakness of right upper extremity      Subjective Assessment - 12/23/15 0907    Subjective Patient states the shoulder felt a little better since last visit.   Pertinent History HTN, Knee arthotomy   Patient Stated Goals decrease pain, improve strength, raise arm   Pain Score 2    Pain Location Arm   Pain Orientation Right   Pain Descriptors / Indicators Aching   Pain Type Surgical pain   Pain Onset 1 to 4 weeks ago   Pain Frequency Constant   Aggravating Factors  overhead movement   Pain Relieving Factors rest and meds   Effect of Pain on Daily Activities limited with ADLS            [facility] PT Assessment - 12/23/15 0001    PROM   PROM Assessment Site Shoulder   Right/Left Shoulder Right   Right Shoulder Flexion 123 Degrees   Right Shoulder ABduction 104 Degrees  scaption   Right Shoulder Internal Rotation 62 Degrees   Right Shoulder External Rotation 36 Degrees                     [facility] Adult PT Treatment/Exercise - 12/23/15 0001    Shoulder Exercises: Pulleys   Flexion --  5 mins flexion  Other Pulley Exercises UE ranger for elevation and circles 2x10 each   Shoulder Exercises: ROM/Strengthening   UBE (Upper Arm Bike) x 6 min at 120 rpm  alternating direction each minute   Modalities   Modalities Electrical Stimulation   Electrical Stimulation   Electrical Stimulation Location right shoulder   Electrical Stimulation Action IFC   Electrical Stimulation Parameters 80-150 Hz to tolerance x 15 min   Electrical Stimulation Goals Pain   Manual Therapy   Manual Therapy Soft tissue mobilization;Scapular mobilization;Passive ROM   Soft tissue mobilization to R pecs, teres muscles, rhomboids and lats   Scapular Mobilization protraction/retraction   Passive ROM to R shoulder  flex/scaption, IR/ER                PT Education - 12/23/15 1226    Education provided Yes    Education Details HEP   Person(s) Educated Patient   Methods Explanation;Demonstration   Comprehension Verbalized understanding;Returned demonstration          PT Short Term Goals - 12/23/15 0915    PT SHORT TERM GOAL #1   Title I with initial HEP (12/24/15)   Time 2   Period Weeks   Status Achieved   PT SHORT TERM GOAL #2   Title decreased pain in R shoulder to 2/10 with ADLSl. ([id number])   Time 2   Period Weeks   Status On-going           PT Long Term Goals - 12/16/15 [id number]    PT LONG TERM GOAL #1   Title I with advanced HEP   Time 6   Period Weeks   Status On-going   PT LONG TERM GOAL #2   Title improved R shoulder to [facility] to peform ADLs.   Time 6   Period Weeks   Status On-going   PT LONG TERM GOAL #3   Title 4+/5 to 5/5 R shoulder strength to improve function.   Time 6   Period Weeks   Status On-going   PT LONG TERM GOAL #4   Title able to peform ADLs with 1/10 or less shoulder pain   Time 6   Period Weeks   Status On-going               Plan - 12/23/15 0916    Clinical Impression Statement Patient continues to report improvements with shoulder. ROM is progressing, but still limited. LTGs are ongoing.   Pt will benefit from skilled therapeutic intervention in order to improve on the following deficits Decreased range of motion;Impaired UE functional use;Decreased activity tolerance;Pain;Decreased strength;Postural dysfunction   Rehab Potential Excellent   PT Frequency 2x / week   PT Duration 6 weeks   PT Treatment/Interventions ADLs/Self Care Home Management;Electrical Stimulation;Moist Heat;Therapeutic exercise;Neuromuscular re-education;Patient/family education;Manual techniques;Vasopneumatic Device;Other (comment);Passive range of motion   PT Next Visit Plan Continue STW/MFR to R lateral scapular muscles and subscap. Cont ROM.. Add prone scapular strenghening as ROM improves.  modaliities for pain.   Consulted and Agree with Plan of Care Patient         Problem List Patient Active Problem List   Diagnosis Date Noted  . BMI 28.0-28.9,adult 08/13/2015  . Pyogenic arthritis of left knee joint ([place])   . Recurrent infections   . Septic joint of left knee joint ([place]) 07/20/2015  . Septic arthritis of knee, left ([place]) 06/20/2015  . Epistaxis 05/20/2015  . Bleeding nose 05/20/2015  . Gastroesophageal reflux disease without esophagitis 05/13/2015  . Hypertension 08/31/2013  .  TIA (transient ischemic attack) 08/31/2013    Madelyn Flavors PT  12/23/2015, 12:30 PM  [facility] [place], [place], [address] Phone: [phone]   Fax:  [phone]  Name: Timothy Duran MRN: [id number] Date of Birth: July 03, 1946

## 2015-12-27 ENCOUNTER — Ambulatory Visit: Payer: Medicare Other | Admitting: *Deleted

## 2015-12-27 DIAGNOSIS — M25511 Pain in right shoulder: Secondary | ICD-10-CM | POA: Diagnosis not present

## 2015-12-27 DIAGNOSIS — R5381 Other malaise: Secondary | ICD-10-CM

## 2015-12-27 DIAGNOSIS — R29898 Other symptoms and signs involving the musculoskeletal system: Secondary | ICD-10-CM

## 2015-12-27 DIAGNOSIS — M25611 Stiffness of right shoulder, not elsewhere classified: Secondary | ICD-10-CM

## 2015-12-27 NOTE — Therapy (Signed)
[facility] [place], [place], [address] Phone: [phone]   Fax:  [phone]  Physical Therapy Treatment  Patient Details  Name: Timothy Duran MRN: [id number] Date of Birth: [birth year] Referring Provider: Justice Britain MD  Encounter Date: 12/27/2015      PT End of Session - 12/27/15 0827    Visit Number 6   Number of Visits 12   Date for PT Re-Evaluation 01/21/16   PT Start Time 0815   PT Stop Time [id number]   PT Time Calculation (min) 59 min      Past Medical History  Diagnosis Date  . Hypertension   . Abdominal hernia     "in there now" (05/20/2015)  . TIA (transient ischemic attack) 2004  . Arthritis     "terrible; all over" (05/20/2015)  . Prostate cancer ([place])   . Basal cell carcinoma     "left neck"  . Epistaxis 05/20/2015    hospitalized    Past Surgical History  Procedure Laterality Date  . Video assisted thoracoscopy (vats)/wedge resection Right 10/2009    "upper; benign"  . Prostatectomy  01/2010  . Cystoscopy w/ stone manipulation  1994    "got one stone; still has one lodge in there" (05/20/2015)  . Bladder stone removal  09/2009  . Basal cell carcinoma excision Left 2015    "neck"  . Nasal endoscopy with epistaxis control Left 05/21/2015    Procedure: NASAL ENDOSCOPY WITH EPISTAXIS CONTROL;  Surgeon: Melissa Montane, MD;  Location: [facility];  Service: ENT;  Laterality: Left;  . Incision and drainage abscess Left 06/20/2015    Procedure: INCISION AND DRAINAGE ABSCESS;  Surgeon: Melina Schools, MD;  Location: [facility];  Service: Orthopedics;  Laterality: Left;  . Knee arthrotomy Left 07/20/2015    Procedure: KNEE ARTHROTOMY Incision and Drainage left knee;  Surgeon: Paralee Cancel, MD;  Location: [facility];  Service: Orthopedics;  Laterality: Left;    There were no vitals filed for this visit.  Visit Diagnosis:  Pain in right shoulder  Decreased ROM of right shoulder  Debility  Weakness of right upper  extremity                       [facility] Adult PT Treatment/Exercise - 12/27/15 0001    Exercises   Exercises Shoulder   Shoulder Exercises: Pulleys   Flexion --  5 mins flexion   Other Pulley Exercises UE ranger for elevation and circles 3x10 each   Modalities   Modalities Electrical Stimulation   Electrical Stimulation   Electrical Stimulation Location right shoulder IFC 80-150hz  x 15 mins   Electrical Stimulation Goals Pain   Manual Therapy   Manual Therapy Soft tissue mobilization;Scapular mobilization;Passive ROM   Soft tissue mobilization to R pecs, teres muscles, rhomboids and lats   Scapular Mobilization protraction/retraction   Passive ROM to R shoulder  flex/scaption, IR/ER                  PT Short Term Goals - 12/23/15 0915    PT SHORT TERM GOAL #1   Title I with initial HEP (12/24/15)   Time 2   Period Weeks   Status Achieved   PT SHORT TERM GOAL #2   Title decreased pain in R shoulder to 2/10 with ADLSl. ([id number])   Time 2   Period Weeks   Status On-going           PT Long Term Goals - 12/16/15  [id number]    PT LONG TERM GOAL #1   Title I with advanced HEP   Time 6   Period Weeks   Status On-going   PT LONG TERM GOAL #2   Title improved R shoulder to [facility] to peform ADLs.   Time 6   Period Weeks   Status On-going   PT LONG TERM GOAL #3   Title 4+/5 to 5/5 R shoulder strength to improve function.   Time 6   Period Weeks   Status On-going   PT LONG TERM GOAL #4   Title able to peform ADLs with 1/10 or less shoulder pain   Time 6   Period Weeks   Status On-going               Plan - 12/27/15 0907    Clinical Impression Statement Pt did fairly well today with Rx, but RT shldr is very stiff still and limited in Elevation ROM to 130 and ER to 50 degrees. LTGs are ongoing at this time and is to keep working on stretching.        Problem List Patient Active Problem List   Diagnosis Date Noted  . BMI 28.0-28.9,adult  08/13/2015  . Pyogenic arthritis of left knee joint ([place])   . Recurrent infections   . Septic joint of left knee joint ([place]) 07/20/2015  . Septic arthritis of knee, left ([place]) 06/20/2015  . Epistaxis 05/20/2015  . Bleeding nose 05/20/2015  . Gastroesophageal reflux disease without esophagitis 05/13/2015  . Hypertension 08/31/2013  . TIA (transient ischemic attack) 08/31/2013    Roshan Salamon,CHRIS, PTA 12/27/2015, 12:51 PM  [facility] [place], [place], [address] Phone: [phone]   Fax:  [phone]  Name: Timothy Duran MRN: [id number] Date of Birth: 1945/12/25

## 2015-12-30 ENCOUNTER — Ambulatory Visit: Payer: Medicare Other | Admitting: Physical Therapy

## 2015-12-30 DIAGNOSIS — R29898 Other symptoms and signs involving the musculoskeletal system: Secondary | ICD-10-CM

## 2015-12-30 DIAGNOSIS — M25511 Pain in right shoulder: Secondary | ICD-10-CM | POA: Diagnosis not present

## 2015-12-30 DIAGNOSIS — M25611 Stiffness of right shoulder, not elsewhere classified: Secondary | ICD-10-CM

## 2015-12-30 NOTE — Therapy (Signed)
[name], [place], [address] Phone: [phone]   Fax:  [phone]  Physical Therapy Treatment  Patient Details  Name: Timothy Duran MRN: [id number] Date of Birth: 08-27-1946 Referring Provider: Justice Britain MD  Encounter Date: 12/30/2015      PT End of Session - 12/30/15 0812    Visit Number 7   Number of Visits 12   Date for PT Re-Evaluation 01/21/16   PT Start Time 0815   PT Stop Time 0917   PT Time Calculation (min) 62 min   Activity Tolerance Patient tolerated treatment well   Behavior During Therapy [facility] for tasks assessed/performed      Past Medical History  Diagnosis Date  . Hypertension   . Abdominal hernia     "in there now" (05/20/2015)  . TIA (transient ischemic attack) 2004  . Arthritis     "terrible; all over" (05/20/2015)  . Prostate cancer ([place])   . Basal cell carcinoma     "left neck"  . Epistaxis 05/20/2015    hospitalized    Past Surgical History  Procedure Laterality Date  . Video assisted thoracoscopy (vats)/wedge resection Right 10/2009    "upper; benign"  . Prostatectomy  01/2010  . Cystoscopy w/ stone manipulation  1994    "got one stone; still has one lodge in there" (05/20/2015)  . Bladder stone removal  09/2009  . Basal cell carcinoma excision Left 2015    "neck"  . Nasal endoscopy with epistaxis control Left 05/21/2015    Procedure: NASAL ENDOSCOPY WITH EPISTAXIS CONTROL;  Surgeon: Melissa Montane, MD;  Location: [facility];  Service: ENT;  Laterality: Left;  . Incision and drainage abscess Left 06/20/2015    Procedure: INCISION AND DRAINAGE ABSCESS;  Surgeon: Melina Schools, MD;  [facility];  Service: Orthopedics;  Laterality: Left;  . Knee arthrotomy Left 07/20/2015    Procedure: KNEE ARTHROTOMY Incision and Drainage left knee;  Surgeon: Paralee Cancel, MD;  [facility];  Service: Orthopedics;  Laterality: Left;    There were no vitals filed for this visit.  Visit Diagnosis:   Decreased ROM of right shoulder  Pain in right shoulder  Weakness of right upper extremity      Subjective Assessment - 12/30/15 0814    Subjective Patient states his shoulder is doing much better. It hurts a little but I can use it a lot more. When patient awoke this morning he said he realized he didn't have any pain at all.   Patient Stated Goals decrease pain, improve strength, raise arm   Currently in Pain? Yes   Pain Score 2    Pain Location Arm   Pain Orientation Right   Pain Descriptors / Indicators Aching   Pain Type Surgical pain   Pain Onset More than a month ago   Pain Frequency Intermittent   Aggravating Factors  overhead movement   Pain Relieving Factors rest   Effect of Pain on Daily Activities limited with ADLS            [facility] PT Assessment - 12/30/15 0001    Assessment   Medical Diagnosis S/P R shoulder arthroscopy SAD, DCR   Onset Date/Surgical Date 11/26/15   Next MD Visit 01/01/16   AROM   AROM Assessment Site Shoulder   Right/Left Shoulder Right   Right Shoulder Flexion 124 Degrees   Right Shoulder ABduction 95 Degrees   Right Shoulder Internal Rotation 48 Degrees  at 45 deg abd  Right Shoulder External Rotation 27 Degrees  at 45 abd   PROM   PROM Assessment Site Shoulder   Right/Left Shoulder Right   Right Shoulder Flexion 135 Degrees   Right Shoulder ABduction 104 Degrees  scaption   Right Shoulder Internal Rotation 54 Degrees  at 45 abd   Right Shoulder External Rotation 33 Degrees  at 45 abd   Strength   Overall Strength Deficits   Overall Strength Comments R shoulder flex, ER, IR 4+/5, ext 5/5, ABD 4/5 and painful   Palpation   Palpation comment marked tenderness of R pectorals                      [facility] Adult PT Treatment/Exercise - 12/30/15 0001    Shoulder Exercises: Standing   External Rotation Strengthening;Right;Theraband  2x10   Theraband Level (Shoulder External Rotation) Level 2 (Red)   Shoulder  Exercises: Pulleys   Flexion 3 minutes  5 mins flexion   Shoulder Exercises: Stretch   Corner Stretch 2 reps;20 seconds   Corner Stretch Limitations attempted in doorway with diffierent arm positions too painful   External Rotation Stretch 1 rep;10 seconds  behind head   Other Shoulder Stretches supine T stretch for pecs   Modalities   Modalities Education officer, environmental Stimulation Location right shoulder IFC 80-150hz  x 15 mins   Electrical Stimulation Goals Pain   Manual Therapy   Manual Therapy Soft tissue mobilization;Scapular mobilization;Passive ROM;Joint mobilization   Joint Mobilization to R clavicle gd II/III   Soft tissue mobilization to R pectorals   Passive ROM to R shoulder all planes                  PT Short Term Goals - 12/30/15 1214    PT SHORT TERM GOAL #1   Title I with initial HEP (12/24/15)   Time 2   Status Achieved   PT SHORT TERM GOAL #2   Title decreased pain in R shoulder to 2/10 with ADLSl. ([id number])   Time 2   Period Weeks   Status On-going           PT Long Term Goals - 12/30/15 1213    PT LONG TERM GOAL #1   Title I with advanced HEP   Time 6   Period Weeks   Status On-going   PT LONG TERM GOAL #2   Title improved R shoulder to [facility] to peform ADLs.   Time 6   Period Weeks   Status On-going   PT LONG TERM GOAL #3   Title 4+/5 to 5/5 R shoulder strength to improve function.   Time 6   Period Weeks   Status On-going   PT LONG TERM GOAL #4   Title able to peform ADLs with 1/10 or less shoulder pain   Time 6   Period Weeks   Status On-going               Plan - 12/30/15 1214    Clinical Impression Statement Patient did fairly well with therapy today. He has marked tightness and tenderness of R pectorals. The right shoulder is considerably forward.  He reports he is using his shoulder a lot with feeding horses and other chores including heavier lifting which may be contributing to  tightness. He responded well to manual to reduce tightness. PT advised patient to continue manual at home in addition to ROM exericses which he reports compliance with. Strength  has improved. ABD is weakest 4/5 and patient reports pain with resistance.  Goals are ongoing.   Pt will benefit from skilled therapeutic intervention in order to improve on the following deficits Decreased range of motion;Impaired UE functional use;Decreased activity tolerance;Pain;Decreased strength;Postural dysfunction   Rehab Potential Excellent   PT Frequency 2x / week   PT Duration 6 weeks   PT Treatment/Interventions ADLs/Self Care Home Management;Electrical Stimulation;Moist Heat;Therapeutic exercise;Neuromuscular re-education;Patient/family education;Manual techniques;Vasopneumatic Device;Other (comment);Passive range of motion   PT Next Visit Plan Continue STW/MFR to R pectorals. Cont ROM.. Add prone scapular strenghening as ROM improves.  modaliities for pain.  MD note sent 12/30/15 for appt 01/01/16.   PT Home Exercise Plan hand behind head for ER stretch, pec stretch as tolerated   Consulted and Agree with Plan of Care Patient        Problem List Patient Active Problem List   Diagnosis Date Noted  . BMI 28.0-28.9,adult 08/13/2015  . Pyogenic arthritis of left knee joint ([place])   . Recurrent infections   . Septic joint of left knee joint ([place]) 07/20/2015  . Septic arthritis of knee, left ([place]) 06/20/2015  . Epistaxis 05/20/2015  . Bleeding nose 05/20/2015  . Gastroesophageal reflux disease without esophagitis 05/13/2015  . Hypertension 08/31/2013  . TIA (transient ischemic attack) 08/31/2013    Madelyn Flavors PT  12/30/2015, 12:24 PM  [facility] [place], [place], [address] Phone: [phone]   Fax:  [phone]  Name: BABACAR BELL MRN: [id number] Date of Birth: 1946-01-01

## 2016-01-03 ENCOUNTER — Encounter: Payer: Self-pay | Admitting: Physical Therapy

## 2016-01-03 ENCOUNTER — Ambulatory Visit: Payer: Medicare Other | Admitting: Physical Therapy

## 2016-01-03 DIAGNOSIS — M25511 Pain in right shoulder: Secondary | ICD-10-CM

## 2016-01-03 DIAGNOSIS — M25611 Stiffness of right shoulder, not elsewhere classified: Secondary | ICD-10-CM

## 2016-01-03 DIAGNOSIS — R29898 Other symptoms and signs involving the musculoskeletal system: Secondary | ICD-10-CM

## 2016-01-03 DIAGNOSIS — R5381 Other malaise: Secondary | ICD-10-CM

## 2016-01-03 NOTE — Therapy (Signed)
[facility] [place], [place], [address] Phone: [phone]   Fax:  [phone]  Physical Therapy Treatment  Patient Details  Name: Timothy Duran MRN: [id number] Date of Birth: Jun 13, 1946 Referring Provider: Justice Britain MD  Encounter Date: 01/03/2016      PT End of Session - 01/03/16 0819    Visit Number 8   Number of Visits 12   Date for PT Re-Evaluation 01/21/16   PT Start Time 0817   PT Stop Time 0903   PT Time Calculation (min) 46 min   Activity Tolerance Patient tolerated treatment well   Behavior During Therapy [facility] for tasks assessed/performed      Past Medical History  Diagnosis Date  . Hypertension   . Abdominal hernia     "in there now" (05/20/2015)  . TIA (transient ischemic attack) 2004  . Arthritis     "terrible; all over" (05/20/2015)  . Prostate cancer ([place])   . Basal cell carcinoma     "left neck"  . Epistaxis 05/20/2015    hospitalized    Past Surgical History  Procedure Laterality Date  . Video assisted thoracoscopy (vats)/wedge resection Right 10/2009    "upper; benign"  . Prostatectomy  01/2010  . Cystoscopy w/ stone manipulation  1994    "got one stone; still has one lodge in there" (05/20/2015)  . Bladder stone removal  09/2009  . Basal cell carcinoma excision Left 2015    "neck"  . Nasal endoscopy with epistaxis control Left 05/21/2015    Procedure: NASAL ENDOSCOPY WITH EPISTAXIS CONTROL;  Surgeon: Melissa Montane, MD;  Location: [facility];  Service: ENT;  Laterality: Left;  . Incision and drainage abscess Left 06/20/2015    Procedure: INCISION AND DRAINAGE ABSCESS;  Surgeon: Melina Schools, MD;  [facility];  Service: Orthopedics;  Laterality: Left;  . Knee arthrotomy Left 07/20/2015    Procedure: KNEE ARTHROTOMY Incision and Drainage left knee;  Surgeon: Paralee Cancel, MD;  [facility];  Service: Orthopedics;  Laterality: Left;    There were no vitals filed for this visit.  Visit Diagnosis:   Decreased ROM of right shoulder  Pain in right shoulder  Weakness of right upper extremity  Debility      Subjective Assessment - 01/03/16 0818    Subjective States that his shoulder is much better today. Has been completing HEP given in previous treatment. States that he thinks the pulley works the best for him and actually made himself one. Patient states that MD said he was unsure if patient should discontinue PT and complete HEP or continue PT per patient report.   Pertinent History HTN, Knee arthotomy   Patient Stated Goals decrease pain, improve strength, raise arm   Currently in Pain? Yes   Pain Location Shoulder   Pain Orientation Right;Anterior   Pain Descriptors / Indicators Burning   Pain Type Surgical pain   Pain Onset More than a month ago   Pain Frequency Intermittent            [facility] PT Assessment - 01/03/16 0001    Assessment   Medical Diagnosis S/P R shoulder arthroscopy SAD, DCR   Onset Date/Surgical Date 11/26/15   Next MD Visit 02/01/16                     [facility] Adult PT Treatment/Exercise - 01/03/16 0001    Shoulder Exercises: Standing   External Rotation Strengthening;Right;Theraband  3x10 reps   Theraband Level (  Shoulder External Rotation) Level 2 (Red)   Internal Rotation Strengthening;Right;Theraband  3x10 reps   Theraband Level (Shoulder Internal Rotation) Level 2 (Red)   Extension Strengthening;Right;Theraband  3x10 reps   Theraband Level (Shoulder Extension) Level 2 (Red)   Row Strengthening;Right;Theraband  3x10 reps   Theraband Level (Shoulder Row) Level 2 (Red)   Shoulder Exercises: Pulleys   Flexion Other (comment)  x5 min   Other Pulley Exercises UE ranger for elevation and circles 3x10 each   Shoulder Exercises: ROM/Strengthening   UBE (Upper Arm Bike) x 6 min at 90 rpm   Modalities   Modalities Electrical Stimulation   Electrical Stimulation   Electrical Stimulation Location R shoulder   Electrical Stimulation  Action IFC   Electrical Stimulation Parameters 1-10 Hz x15 min   Electrical Stimulation Goals Pain                  PT Short Term Goals - 12/30/15 1214    PT SHORT TERM GOAL #1   Title I with initial HEP (12/24/15)   Time 2   Status Achieved   PT SHORT TERM GOAL #2   Title decreased pain in R shoulder to 2/10 with ADLSl. ([id number])   Time 2   Period Weeks   Status On-going           PT Long Term Goals - 01/03/16 0820    PT LONG TERM GOAL #1   Title I with advanced HEP   Time 6   Period Weeks   Status Achieved   PT LONG TERM GOAL #2   Title improved R shoulder to [facility] to peform ADLs.   Time 6   Period Weeks   Status On-going   PT LONG TERM GOAL #3   Title 4+/5 to 5/5 R shoulder strength to improve function.   Time 6   Period Weeks   Status On-going   PT LONG TERM GOAL #4   Title able to peform ADLs with 1/10 or less shoulder pain   Time 6   Period Weeks   Status On-going               Plan - 01/03/16 0820    Clinical Impression Statement Patient tolerated today's treatment well today overall with only report of short duration sharp sensation in R shoulder during pulleys in which he stretched RUE more and pain went away. Patient also encountered discomfort and soreness with R shoulder ER with red theraband today per patient report. No other complaints of pain, soreness or discomfort were verbalized with patient during treatment. Achieved LT advanced HEP goal per patient report in clinic and states that he really has been exercising his shoulder frequently. Patient presented with decreased R Pectoralis tightness upon palpation today but continued to have some tightness in posterior R shoulder. Normal stimulation response noted following removal of the modality.   Pt will benefit from skilled therapeutic intervention in order to improve on the following deficits Decreased range of motion;Impaired UE functional use;Decreased activity tolerance;Pain;Decreased  strength;Postural dysfunction   Rehab Potential Excellent   PT Frequency 2x / week   PT Duration 6 weeks   PT Treatment/Interventions ADLs/Self Care Home Management;Electrical Stimulation;Moist Heat;Therapeutic exercise;Neuromuscular re-education;Patient/family education;Manual techniques;Vasopneumatic Device;Other (comment);Passive range of motion   PT Next Visit Plan Continue STW/MFR to R pectorals. Cont ROM.. Add prone scapular strenghening as ROM improves.  modaliities for pain.   PT Home Exercise Plan hand behind head for ER stretch, pec stretch as tolerated  Consulted and Agree with Plan of Care Patient        Problem List Patient Active Problem List   Diagnosis Date Noted  . BMI 28.0-28.9,adult 08/13/2015  . Pyogenic arthritis of left knee joint ([place])   . Recurrent infections   . Septic joint of left knee joint ([place]) 07/20/2015  . Septic arthritis of knee, left ([place]) 06/20/2015  . Epistaxis 05/20/2015  . Bleeding nose 05/20/2015  . Gastroesophageal reflux disease without esophagitis 05/13/2015  . Hypertension 08/31/2013  . TIA (transient ischemic attack) 08/31/2013    Wynelle Fanny, PTA 01/03/2016, 9:35 AM  [facility] [place], [place], [address] Phone: [phone]   Fax:  [phone]  Name: Timothy Duran MRN: [id number] Date of Birth: 1946/07/01

## 2016-01-06 ENCOUNTER — Ambulatory Visit: Payer: Medicare Other | Admitting: Physical Therapy

## 2016-01-06 ENCOUNTER — Encounter: Payer: Self-pay | Admitting: Physical Therapy

## 2016-01-06 DIAGNOSIS — R29898 Other symptoms and signs involving the musculoskeletal system: Secondary | ICD-10-CM

## 2016-01-06 DIAGNOSIS — M25511 Pain in right shoulder: Secondary | ICD-10-CM

## 2016-01-06 DIAGNOSIS — R5381 Other malaise: Secondary | ICD-10-CM

## 2016-01-06 DIAGNOSIS — M25611 Stiffness of right shoulder, not elsewhere classified: Secondary | ICD-10-CM

## 2016-01-06 NOTE — Therapy (Signed)
[facility] [place], [place], [address] Phone: [phone]   Fax:  [phone]  Physical Therapy Treatment  Patient Details  Name: Timothy Duran MRN: [id number] Date of Birth: 05-02-1946 Referring Provider: Justice Britain MD  Encounter Date: 01/06/2016      PT End of Session - 01/06/16 0749    Visit Number 9   Number of Visits 12   Date for PT Re-Evaluation 01/21/16   PT Start Time 0735   PT Stop Time 0830   PT Time Calculation (min) 55 min   Activity Tolerance Patient tolerated treatment well   Behavior During Therapy [facility] for tasks assessed/performed      Past Medical History  Diagnosis Date  . Hypertension   . Abdominal hernia     "in there now" (05/20/2015)  . TIA (transient ischemic attack) 2004  . Arthritis     "terrible; all over" (05/20/2015)  . Prostate cancer ([place])   . Basal cell carcinoma     "left neck"  . Epistaxis 05/20/2015    hospitalized    Past Surgical History  Procedure Laterality Date  . Video assisted thoracoscopy (vats)/wedge resection Right 10/2009    "upper; benign"  . Prostatectomy  01/2010  . Cystoscopy w/ stone manipulation  1994    "got one stone; still has one lodge in there" (05/20/2015)  . Bladder stone removal  09/2009  . Basal cell carcinoma excision Left 2015    "neck"  . Nasal endoscopy with epistaxis control Left 05/21/2015    Procedure: NASAL ENDOSCOPY WITH EPISTAXIS CONTROL;  Surgeon: Melissa Montane, MD;  Location: [place];  Service: ENT;  Laterality: Left;  . Incision and drainage abscess Left 06/20/2015    Procedure: INCISION AND DRAINAGE ABSCESS;  Surgeon: Melina Schools, MD;  Location: [facility];  Service: Orthopedics;  Laterality: Left;  . Knee arthrotomy Left 07/20/2015    Procedure: KNEE ARTHROTOMY Incision and Drainage left knee;  Surgeon: Paralee Cancel, MD;  Location: [facility];  Service: Orthopedics;  Laterality: Left;    There were no vitals filed for this visit.  Visit Diagnosis:   Decreased ROM of right shoulder  Pain in right shoulder  Weakness of right upper extremity  Debility      Subjective Assessment - 01/06/16 0743    Subjective Patient reported shoulder doing better overall   Pertinent History HTN, Knee arthotomy   Patient Stated Goals decrease pain, improve strength, raise arm   Currently in Pain? Yes   Pain Score 2    Pain Location Shoulder   Pain Orientation Right   Pain Descriptors / Indicators Burning   Pain Type Surgical pain   Pain Onset More than a month ago   Pain Frequency Intermittent   Aggravating Factors  overhead activity   Pain Relieving Factors at rest                         [facility] Adult PT Treatment/Exercise - 01/06/16 0001    Shoulder Exercises: Seated   Other Seated Exercises scaption 2# 2x10 to 90 degrees   Shoulder Exercises: Prone   Retraction Strengthening;Right;Weights  kneeling 2x10   Retraction Weight (lbs) 2   Extension Strengthening;Right;Weights  kneeling 2x10   Extension Weight (lbs) 2   Shoulder Exercises: Sidelying   External Rotation Strengthening;Right;Weights  3x10   External Rotation Weight (lbs) 2   Shoulder Exercises: Standing   External Rotation Strengthening;Right;Theraband  3x10   Theraband Level (Shoulder External  Rotation) Level 2 (Red)   Internal Rotation Strengthening;Right;Theraband  3x10   Theraband Level (Shoulder Internal Rotation) Level 2 (Red)   Extension Strengthening;Right;Theraband  3x10   Theraband Level (Shoulder Extension) Level 2 (Red)   Row Strengthening;Right;Theraband  3x10   Theraband Level (Shoulder Row) Level 2 (Red)   Shoulder Exercises: Pulleys   Flexion --  72mn   Other Pulley Exercises UE ranger for elevation and circles 3x10 each   Shoulder Exercises: ROM/Strengthening   UBE (Upper Arm Bike) x 6 min at 90 rpm   Modalities   Modalities Moist Heat   Moist Heat Therapy   Number Minutes Moist Heat 15 Minutes   Moist Heat Location Shoulder    Electrical Stimulation   Electrical Stimulation Location R shoulder   Electrical Stimulation Action premod   Electrical Stimulation Parameters 1-10hz   Electrical Stimulation Goals Pain                  PT Short Term Goals - 01/06/16 0751    PT SHORT TERM GOAL #1   Title I with initial HEP (12/24/15)   Time 2   Period Weeks   Status Achieved   PT SHORT TERM GOAL #2   Title decreased pain in R shoulder to 2/10 with ADLSl. (37/2/53   Time 2   Period Weeks   Status Achieved  01/06/16           PT Long Term Goals - 01/06/16 0757    PT LONG TERM GOAL #1   Title I with advanced HEP   Time 6   Period Weeks   Status Achieved   PT LONG TERM GOAL #2   [facility]   Time 6   Period Weeks   Status On-going   PT LONG TERM GOAL #3   Title 4+/5 to 5/5 R shoulder strength to improve function.   Time 6   Period Weeks   Status On-going   PT LONG TERM GOAL #4   Title able to peform ADLs with 1/10 or less shoulder pain   Time 6   Period Weeks   Status Achieved  01/06/16               Plan - 01/06/16 0752    Clinical Impression Statement Patient progressing with all activties. Patient able to progress with strength exercises with no pain increase. Patient has mild fatigue with certain movements overhead or ER. Patient able to perform ADL's with pain under 1/10. Patient met STG#2 and LTG #4 today others ongoing due to strength deficit.    Pt will benefit from skilled therapeutic intervention in order to improve on the following deficits Decreased range of motion;Impaired UE functional use;Decreased activity tolerance;Pain;Decreased strength;Postural dysfunction   Rehab Potential Excellent   PT Frequency 2x / week   PT Duration 6 weeks   PT Treatment/Interventions ADLs/Self Care Home Management;Electrical Stimulation;Moist Heat;Therapeutic exercise;Neuromuscular re-education;Patient/family education;Manual techniques;Vasopneumatic  Device;Other (comment);Passive range of motion   PT Next Visit Plan Continue STW/MFR to R pectorals. Cont ROM.. Add prone scapular strenghening as ROM improves.  modaliities for pain.   Consulted and Agree with Plan of Care Patient        Problem List Patient Active Problem List   Diagnosis Date Noted  . BMI 28.0-28.9,adult 08/13/2015  . [place]   . Recurrent infections   . Septic [place] of [place] 07/20/2015  . Septic arthritis of  knee, left ([employer]) 06/20/2015  . Epistaxis 05/20/2015  . Bleeding nose 05/20/2015  . Gastroesophageal reflux disease without esophagitis 05/13/2015  . Hypertension 08/31/2013  . TIA (transient ischemic attack) 08/31/2013    Coretha Creswell P, PTA 01/06/2016, 8:34 AM  [facility] [place], [place], [address] Phone: [phone]   Fax:  [phone]  Name: Timothy Duran MRN: [id number] Date of Birth: Feb 03, 1946

## 2016-01-10 ENCOUNTER — Encounter: Payer: Self-pay | Admitting: Physical Therapy

## 2016-01-10 ENCOUNTER — Ambulatory Visit: Payer: Medicare Other | Admitting: Physical Therapy

## 2016-01-10 DIAGNOSIS — M25511 Pain in right shoulder: Secondary | ICD-10-CM | POA: Diagnosis not present

## 2016-01-10 DIAGNOSIS — M25611 Stiffness of right shoulder, not elsewhere classified: Secondary | ICD-10-CM

## 2016-01-10 DIAGNOSIS — R5381 Other malaise: Secondary | ICD-10-CM

## 2016-01-10 DIAGNOSIS — R29898 Other symptoms and signs involving the musculoskeletal system: Secondary | ICD-10-CM

## 2016-01-10 NOTE — Therapy (Signed)
[facility] [place], [place], [address] Phone: [phone]   Fax:  [phone]  Physical Therapy Treatment  Patient Details  Name: Timothy Duran MRN: [id number] Date of Birth: 08/30/1946 Referring Provider: Justice Britain MD  Encounter Date: 01/10/2016      PT End of Session - 01/10/16 0726    Visit Number 10   Number of Visits 12   Date for PT Re-Evaluation 01/21/16   PT Start Time 0723   PT Stop Time 0814   PT Time Calculation (min) 51 min   Activity Tolerance Patient tolerated treatment well   Behavior During Therapy [facility] for tasks assessed/performed      Past Medical History  Diagnosis Date  . Hypertension   . Abdominal hernia     "in there now" (05/20/2015)  . TIA (transient ischemic attack) 2004  . Arthritis     "terrible; all over" (05/20/2015)  . Prostate cancer ([place])   . Basal cell carcinoma     "left neck"  . Epistaxis 05/20/2015    hospitalized    Past Surgical History  Procedure Laterality Date  . Video assisted thoracoscopy (vats)/wedge resection Right 10/2009    "upper; benign"  . Prostatectomy  01/2010  . Cystoscopy w/ stone manipulation  1994    "got one stone; still has one lodge in there" (05/20/2015)  . Bladder stone removal  09/2009  . Basal cell carcinoma excision Left 2015    "neck"  . Nasal endoscopy with epistaxis control Left 05/21/2015    Procedure: NASAL ENDOSCOPY WITH EPISTAXIS CONTROL;  Surgeon: Melissa Montane, MD;  Location: [place];  Service: ENT;  Laterality: Left;  . Incision and drainage abscess Left 06/20/2015    Procedure: INCISION AND DRAINAGE ABSCESS;  Surgeon: Melina Schools, MD;  [facility];  Service: Orthopedics;  Laterality: Left;  . Knee arthrotomy Left 07/20/2015    Procedure: KNEE ARTHROTOMY Incision and Drainage left knee;  Surgeon: Paralee Cancel, MD;  [facility];  Service: Orthopedics;  Laterality: Left;    There were no vitals filed for this visit.  Visit Diagnosis:   Decreased ROM of right shoulder  Pain in right shoulder  Weakness of right upper extremity  Debility      Subjective Assessment - 01/10/16 0745    Subjective States that his shoulder is sore today following mopping and doing a few activities he states he knows he should not have done.   Pertinent History HTN, Knee arthotomy   Patient Stated Goals decrease pain, improve strength, raise arm   Currently in Pain? Yes   Pain Score 2    Pain Location Shoulder   Pain Orientation Right;Anterior   Pain Descriptors / Indicators Sore   Pain Type Surgical pain   Pain Onset More than a month ago            [facility] PT Assessment - 01/10/16 0001    Assessment   Medical Diagnosis S/P R shoulder arthroscopy SAD, DCR   Onset Date/Surgical Date 11/26/15   Next MD Visit 02/01/16                     [facility] Adult PT Treatment/Exercise - 01/10/16 0001    Shoulder Exercises: Prone   Retraction Strengthening;Right;Weights  3x10 reps   Retraction Weight (lbs) 2   Retraction Limitations bent   Extension Strengthening;Right;Weights  3x10 reps; bent   Extension Weight (lbs) 2   Horizontal ABduction 1 Strengthening;Right;Weights  3 x10 reps;  bent   Horizontal ABduction 1 Weight (lbs) 1   Shoulder Exercises: Sidelying   External Rotation Strengthening;Right;Weights  3x10 reps   External Rotation Weight (lbs) 2   Internal Rotation Strengthening;Right;Weights  3x10 reps   Internal Rotation Weight (lbs) 2   Shoulder Exercises: Standing   External Rotation Strengthening;Right;Theraband  3x10 reps   Theraband Level (Shoulder External Rotation) Level 2 (Red)   Internal Rotation Strengthening;Right;Theraband  3x10 reps   Theraband Level (Shoulder Internal Rotation) Level 2 (Red)   Flexion Strengthening;Right;Weights  3x10 reps   Shoulder Flexion Weight (lbs) 1   Extension Strengthening;Right;Theraband  3x10 reps   Theraband Level (Shoulder Extension) Level 2 (Red)   Row  Strengthening;Right;Theraband  3x10 reps   Theraband Level (Shoulder Row) Level 2 (Red)   Other Standing Exercises B scapular depression red theraband 3x10 reps   Other Standing Exercises R shoulder scaption 1# 3x10 reps   Shoulder Exercises: Pulleys   Flexion Other (comment)  x 7 min   Shoulder Exercises: ROM/Strengthening   UBE (Upper Arm Bike) x 8 min at 90 rpm   Modalities   Modalities Electrical Stimulation;Moist Heat   Moist Heat Therapy   Number Minutes Moist Heat 15 Minutes   Moist Heat Location Shoulder   Electrical Stimulation   Electrical Stimulation Location R shoulder   Electrical Stimulation Action Pre-Mod   Electrical Stimulation Parameters 80-150 Hz x15 min   Electrical Stimulation Goals Pain                  PT Short Term Goals - 01/06/16 0751    PT SHORT TERM GOAL #1   Title I with initial HEP (12/24/15)   Time 2   Period Weeks   Status Achieved   PT SHORT TERM GOAL #2   Title decreased pain in R shoulder to 2/10 with ADLSl. ([id number])   Time 2   Period Weeks   Status Achieved  01/06/16           PT Long Term Goals - 01/06/16 0757    PT LONG TERM GOAL #1   Title I with advanced HEP   Time 6   Period Weeks   Status Achieved   PT LONG TERM GOAL #2   Title improved R shoulder to [facility] to peform ADLs.   Time 6   Period Weeks   Status On-going   PT LONG TERM GOAL #3   Title 4+/5 to 5/5 R shoulder strength to improve function.   Time 6   Period Weeks   Status On-going   PT LONG TERM GOAL #4   Title able to peform ADLs with 1/10 or less shoulder pain   Time 6   Period Weeks   Status Achieved  01/06/16               Plan - 01/10/16 0803    Clinical Impression Statement Patient continues to progress regarding strengthening exercises and completes without reports of increased pain or discomfort. Fatigue noted as antigravity R shoulder flexion and scaption progressed with 1#. Patient difficulty with reaching behind him into IR and  also a little difficulty with reaching overhead per patient report. Normal modalities response noted following removal of the modalities. Remaining LT goals of R shoulder strength and functional ROM are still on-going at this time. Patient denied R shoulder pain following today's treatment.   Pt will benefit from skilled therapeutic intervention in order to improve on the following deficits Decreased range of motion;Impaired UE functional use;Decreased  activity tolerance;Pain;Decreased strength;Postural dysfunction   Rehab Potential Excellent   PT Frequency 2x / week   PT Duration 6 weeks   PT Treatment/Interventions ADLs/Self Care Home Management;Electrical Stimulation;Moist Heat;Therapeutic exercise;Neuromuscular re-education;Patient/family education;Manual techniques;Vasopneumatic Device;Other (comment);Passive range of motion   PT Next Visit Plan Continue R shoulder strengthening and ROM per MPT POC with modalities for pain PRN.   PT Home Exercise Plan hand behind head for ER stretch, pec stretch as tolerated   Consulted and Agree with Plan of Care Patient        Problem List Patient Active Problem List   Diagnosis Date Noted  . BMI 28.0-28.9,adult 08/13/2015  . Pyogenic arthritis of left knee joint ([place])   . Recurrent infections   . Septic joint of left knee joint ([place]) 07/20/2015  . Septic arthritis of knee, left ([place]) 06/20/2015  . Epistaxis 05/20/2015  . Bleeding nose 05/20/2015  . Gastroesophageal reflux disease without esophagitis 05/13/2015  . Hypertension 08/31/2013  . TIA (transient ischemic attack) 08/31/2013    Ahmed Prima, PTA 01/10/2016 8:25 AM  [facility] [place], [place], [address] Phone: [phone]   Fax:  [phone]  Name: Timothy Duran MRN: [id number] Date of Birth: 1946/07/27

## 2016-01-13 ENCOUNTER — Ambulatory Visit: Payer: Medicare Other | Admitting: Physical Therapy

## 2016-01-13 ENCOUNTER — Encounter: Payer: Self-pay | Admitting: Physical Therapy

## 2016-01-13 DIAGNOSIS — R29898 Other symptoms and signs involving the musculoskeletal system: Secondary | ICD-10-CM

## 2016-01-13 DIAGNOSIS — M25611 Stiffness of right shoulder, not elsewhere classified: Secondary | ICD-10-CM

## 2016-01-13 DIAGNOSIS — M25511 Pain in right shoulder: Secondary | ICD-10-CM | POA: Diagnosis not present

## 2016-01-13 DIAGNOSIS — R5381 Other malaise: Secondary | ICD-10-CM

## 2016-01-13 NOTE — Therapy (Signed)
[facility] [place], [place], [address] Phone: [phone]   Fax:  [phone]  Physical Therapy Treatment  Patient Details  Name: Timothy Duran MRN: [id number] Date of Birth: 1946-07-04 Referring Provider: Justice Britain MD  Encounter Date: 01/13/2016      PT End of Session - 01/13/16 0812    Visit Number 11   Number of Visits 12   Date for PT Re-Evaluation 01/21/16   PT Start Time 0736   PT Stop Time 0831   PT Time Calculation (min) 55 min   Activity Tolerance Patient tolerated treatment well   Behavior During Therapy [facility] for tasks assessed/performed      Past Medical History  Diagnosis Date  . Hypertension   . Abdominal hernia     "in there now" (05/20/2015)  . TIA (transient ischemic attack) 2004  . Arthritis     "terrible; all over" (05/20/2015)  . Prostate cancer ([place])   . Basal cell carcinoma     "left neck"  . Epistaxis 05/20/2015    hospitalized    Past Surgical History  Procedure Laterality Date  . Video assisted thoracoscopy (vats)/wedge resection Right 10/2009    "upper; benign"  . Prostatectomy  01/2010  . Cystoscopy w/ stone manipulation  1994    "got one stone; still has one lodge in there" (05/20/2015)  . Bladder stone removal  09/2009  . Basal cell carcinoma excision Left 2015    "neck"  . Nasal endoscopy with epistaxis control Left 05/21/2015    Procedure: NASAL ENDOSCOPY WITH EPISTAXIS CONTROL;  Surgeon: Melissa Montane, MD;  Location: [place];  Service: ENT;  Laterality: Left;  . Incision and drainage abscess Left 06/20/2015    Procedure: INCISION AND DRAINAGE ABSCESS;  Surgeon: Melina Schools, MD;  [facility];  Service: Orthopedics;  Laterality: Left;  . Knee arthrotomy Left 07/20/2015    Procedure: KNEE ARTHROTOMY Incision and Drainage left knee;  Surgeon: Paralee Cancel, MD;  [facility];  Service: Orthopedics;  Laterality: Left;    There were no vitals filed for this visit.  Visit Diagnosis:   Decreased ROM of right shoulder  Pain in right shoulder  Weakness of right upper extremity  Debility      Subjective Assessment - 01/13/16 0742    Subjective no new complaints per patient today   Pertinent History HTN, Knee arthotomy   Patient Stated Goals decrease pain, improve strength, raise arm   Currently in Pain? Yes   Pain Score 2    Pain Location Shoulder   Pain Orientation Right   Pain Descriptors / Indicators Sore   Pain Type Surgical pain   Pain Onset More than a month ago   Pain Frequency Intermittent   Aggravating Factors  overhead activity   Pain Relieving Factors rest                         [facility] Adult PT Treatment/Exercise - 01/13/16 0001    Shoulder Exercises: Prone   Retraction Strengthening;Right;Weights  kneeling 3x10   Retraction Weight (lbs) 2   Extension Strengthening;Right;Weights  kneeling 3x10   Extension Weight (lbs) 2   Horizontal ABduction 1 Strengthening;Right;Weights  kneeling 3x10   Horizontal ABduction 1 Weight (lbs) 1   Shoulder Exercises: Sidelying   External Rotation Strengthening;Right;Weights  3x10   External Rotation Weight (lbs) 2   Shoulder Exercises: Standing   External Rotation Strengthening;Right;Theraband  3x10   Theraband Level (Shoulder External  Rotation) Level 2 (Red)   Internal Rotation Strengthening;Right;Theraband  3x10   Theraband Level (Shoulder Internal Rotation) Level 2 (Red)   Flexion Strengthening;Right;Weights  3x10   Shoulder Flexion Weight (lbs) 1   Extension Strengthening;Right;Theraband  3x10   Theraband Level (Shoulder Extension) Level 2 (Red)   Row Strengthening;Right;Theraband  3x10   Theraband Level (Shoulder Row) Level 2 (Red)   Other Standing Exercises wall slide with eccentic lowering x fatigue   Other Standing Exercises R shoulder scaption 1# 3x10 reps   Shoulder Exercises: Pulleys   Flexion --  62min   Shoulder Exercises: ROM/Strengthening   UBE (Upper Arm Bike) x 8  min at 90 rpm   Moist Heat Therapy   Number Minutes Moist Heat 15 Minutes   Moist Heat Location Shoulder   Electrical Stimulation   Electrical Stimulation Location R shoulder   Electrical Stimulation Action premod   Electrical Stimulation Parameters 80-150hz    Electrical Stimulation Goals Pain                  PT Short Term Goals - 01/06/16 0751    PT SHORT TERM GOAL #1   Title I with initial HEP (12/24/15)   Time 2   Period Weeks   Status Achieved   PT SHORT TERM GOAL #2   Title decreased pain in R shoulder to 2/10 with ADLSl. ([id number])   Time 2   Period Weeks   Status Achieved  01/06/16           PT Long Term Goals - 01/06/16 0757    PT LONG TERM GOAL #1   Title I with advanced HEP   Time 6   Period Weeks   Status Achieved   PT LONG TERM GOAL #2   Title improved R shoulder to [facility] to peform ADLs.   Time 6   Period Weeks   Status On-going   PT LONG TERM GOAL #3   Title 4+/5 to 5/5 R shoulder strength to improve function.   Time 6   Period Weeks   Status On-going   PT LONG TERM GOAL #4   Title able to peform ADLs with 1/10 or less shoulder pain   Time 6   Period Weeks   Status Achieved  01/06/16               Plan - 01/13/16 0813    Clinical Impression Statement Patient reported less pain overall, yet has soreness with use daily with ADL's. Patient progressing with strengthening exercises and no pain reports during, only some fatigue. Patient unable to meet any further goals due to strength deficits.   Pt will benefit from skilled therapeutic intervention in order to improve on the following deficits Decreased range of motion;Impaired UE functional use;Decreased activity tolerance;Pain;Decreased strength;Postural dysfunction   Rehab Potential Excellent   PT Frequency 2x / week   PT Duration 6 weeks   PT Treatment/Interventions ADLs/Self Care Home Management;Electrical Stimulation;Moist Heat;Therapeutic exercise;Neuromuscular  re-education;Patient/family education;Manual techniques;Vasopneumatic Device;Other (comment);Passive range of motion   PT Next Visit Plan Continue R shoulder strengthening and ROM per MPT POC with modalities for pain PRN. (April 15 Dr. supple)   Consulted and Agree with Plan of Care Patient        Problem List Patient Active Problem List   Diagnosis Date Noted  . BMI 28.0-28.9,adult 08/13/2015  . Pyogenic arthritis of left knee joint ([place])   . Recurrent infections   . Septic joint of left knee joint ([place]) 07/20/2015  .  Septic arthritis of knee, left ([place]) 06/20/2015  . Epistaxis 05/20/2015  . Bleeding nose 05/20/2015  . Gastroesophageal reflux disease without esophagitis 05/13/2015  . Hypertension 08/31/2013  . TIA (transient ischemic attack) 08/31/2013    Malisha Mabey P, PTA 01/13/2016, 8:46 AM  [facility] [place], [place], [address] Phone: [phone]   Fax:  [phone]  Name: Timothy Duran MRN: [id number] Date of Birth: [birth year]

## 2016-01-17 ENCOUNTER — Ambulatory Visit: Payer: Medicare Other | Admitting: *Deleted

## 2016-01-17 DIAGNOSIS — R5381 Other malaise: Secondary | ICD-10-CM

## 2016-01-17 DIAGNOSIS — M25511 Pain in right shoulder: Secondary | ICD-10-CM

## 2016-01-17 DIAGNOSIS — M25611 Stiffness of right shoulder, not elsewhere classified: Secondary | ICD-10-CM

## 2016-01-17 DIAGNOSIS — R29898 Other symptoms and signs involving the musculoskeletal system: Secondary | ICD-10-CM

## 2016-01-17 NOTE — Therapy (Addendum)
[facility] [place], [place], [address] Phone: [phone]   Fax:  [phone]  Physical Therapy Treatment  Patient Details  Name: Timothy Duran MRN: [id number] Date of Birth: 1946-05-13 Referring Provider: Justice Britain MD  Encounter Date: 01/17/2016      PT End of Session - 01/17/16 0912    Visit Number 12   Number of Visits 12   Date for PT Re-Evaluation 01/21/16   PT Start Time 0830   PT Stop Time 0915   PT Time Calculation (min) 45 min   Activity Tolerance Patient tolerated treatment well      Past Medical History  Diagnosis Date  . Hypertension   . Abdominal hernia     "in there now" (05/20/2015)  . TIA (transient ischemic attack) 2004  . Arthritis     "terrible; all over" (05/20/2015)  . Prostate cancer ([place])   . Basal cell carcinoma     "left neck"  . Epistaxis 05/20/2015    hospitalized    Past Surgical History  Procedure Laterality Date  . Video assisted thoracoscopy (vats)/wedge resection Right 10/2009    "upper; benign"  . Prostatectomy  01/2010  . Cystoscopy w/ stone manipulation  1994    "got one stone; still has one lodge in there" (05/20/2015)  . Bladder stone removal  09/2009  . Basal cell carcinoma excision Left 2015    "neck"  . Nasal endoscopy with epistaxis control Left 05/21/2015    Procedure: NASAL ENDOSCOPY WITH EPISTAXIS CONTROL;  Surgeon: Melissa Montane, MD;  Location: [facility];  Service: ENT;  Laterality: Left;  . Incision and drainage abscess Left 06/20/2015    Procedure: INCISION AND DRAINAGE ABSCESS;  Surgeon: Melina Schools, MD;  Location: [facility];  Service: Orthopedics;  Laterality: Left;  . Knee arthrotomy Left 07/20/2015    Procedure: KNEE ARTHROTOMY Incision and Drainage left knee;  Surgeon: Paralee Cancel, MD;  Location: [facility];  Service: Orthopedics;  Laterality: Left;    There were no vitals filed for this visit.  Visit Diagnosis:  Decreased ROM of right shoulder  Pain in right  shoulder  Weakness of right upper extremity  Debility      Subjective Assessment - 01/17/16 0835    Subjective no new complaints per patient today. RT shldr is doing better. Will do HEP until I see MD on 02-01-16.  15 mins late   Patient Stated Goals decrease pain, improve strength, raise arm   Currently in Pain? Yes   Pain Score 2    Pain Location Shoulder   Pain Orientation Right   Pain Descriptors / Indicators Sore   Pain Type Surgical pain   Pain Onset More than a month ago   Pain Frequency Intermittent            [facility] PT Assessment - 01/17/16 0001    PROM   PROM Assessment Site Shoulder   Right/Left Shoulder Right   Right Shoulder Flexion 135 Degrees   Right Shoulder External Rotation 50 Degrees                     [facility] Adult PT Treatment/Exercise - 01/17/16 0001    Shoulder Exercises: Standing   External Rotation Strengthening;Right;Theraband  3x10   Theraband Level (Shoulder External Rotation) Level 2 (Red)   Internal Rotation Strengthening;Right;Theraband  3x10   Theraband Level (Shoulder Internal Rotation) Level 2 (Red)   Flexion Strengthening;Right;Weights  3x10   Row Strengthening;Right;Theraband  3x10  Theraband Level (Shoulder Row) Level 2 (Red)   Other Standing Exercises wall slide with eccentic lowering x fatigue   Shoulder Exercises: ROM/Strengthening   UBE (Upper Arm Bike) x 8 min at 90 rpm   Modalities   Modalities Electrical Stimulation;Moist Heat   Moist Heat Therapy   Number Minutes Moist Heat 15 Minutes   Moist Heat Location Shoulder   Electrical Stimulation   Electrical Stimulation Location right shoulder IFC 80-'150hz'  x 15 mins   Electrical Stimulation Goals Pain                  PT Short Term Goals - 01/06/16 0751    PT SHORT TERM GOAL #1   Title I with initial HEP (12/24/15)   Time 2   Period Weeks   Status Achieved   PT SHORT TERM GOAL #2   Title decreased pain in R shoulder to 2/10 with ADLSl.  ([birth year])   Time 2   Period Weeks   Status Achieved  01/06/16           PT Long Term Goals - 01/06/16 0757    PT LONG TERM GOAL #1   Title I with advanced HEP   Time 6   Period Weeks   Status Achieved   PT LONG TERM GOAL #2   Title improved R shoulder to [facility] to peform ADLs.   Time 6   Period Weeks   Status On-going   PT LONG TERM GOAL #3   Title 4+/5 to 5/5 R shoulder strength to improve function.   Time 6   Period Weeks   Status On-going   PT LONG TERM GOAL #4   Title able to peform ADLs with 1/10 or less shoulder pain   Time 6   Period Weeks   Status Achieved  01/06/16               Plan - 01/17/16 0913    Clinical Impression Statement Pt did well with Rx and is Independent in HEP to continue Ex until MD Appt. He still has a ROM deficit in RT shldr in all motions. 2 out of 4 LTGs met at this time.   Pt will benefit from skilled therapeutic intervention in order to improve on the following deficits Decreased range of motion;Impaired UE functional use;Decreased activity tolerance;Pain;Decreased strength;Postural dysfunction   Rehab Potential Excellent   PT Frequency 2x / week   PT Duration 6 weeks   PT Treatment/Interventions ADLs/Self Care Home Management;Electrical Stimulation;Moist Heat;Therapeutic exercise;Neuromuscular re-education;Patient/family education;Manual techniques;Vasopneumatic Device;Other (comment);Passive range of motion   PT Next Visit Plan Continue R shoulder strengthening and ROM per MPT POC with modalities for pain PRN. (April 15 Dr. supple)  Pt to perform HEP until MD F/U.  Send [place] hand behind head for ER stretch, pec stretch as tolerated   Consulted and Agree with Plan of Care Patient        Problem List Patient Active Problem List   Diagnosis Date Noted  . BMI 28.0-28.9,adult 08/13/2015  . Pyogenic arthritis of left knee joint ([place])   . Recurrent infections   . Septic joint of left knee joint ([place])  07/20/2015  . Septic arthritis of knee, left ([place]) 06/20/2015  . Epistaxis 05/20/2015  . Bleeding nose 05/20/2015  . Gastroesophageal reflux disease without esophagitis 05/13/2015  . Hypertension 08/31/2013  . TIA (transient ischemic attack) 08/31/2013    Sibyl Mikula,CHRIS, PTA 01/17/2016, 9:24 AM  [facility]  [place], [place], [address] Phone: [phone]   Fax:  [phone]  Name: Timothy Duran MRN: [id number] Date of Birth: March 23, 1946   PHYSICAL THERAPY DISCHARGE SUMMARY  Visits from Start of Care: 12.  Current functional level related to goals / functional outcomes: See above.   Remaining deficits: Decreased right shoulder ROM and strength.   Education / Equipment: HEP. Plan: Patient agrees to discharge.  Patient goals were partially met. Patient is being discharged due to not returning since the last visit.  ?????         Mali Applegate MPT

## 2016-02-03 ENCOUNTER — Other Ambulatory Visit: Payer: Self-pay | Admitting: Nurse Practitioner

## 2016-02-03 NOTE — Telephone Encounter (Signed)
Last seen 10/17/15  MMM

## 2016-02-10 ENCOUNTER — Telehealth: Payer: Self-pay | Admitting: Nurse Practitioner

## 2016-03-17 ENCOUNTER — Telehealth: Payer: Self-pay | Admitting: Nurse Practitioner

## 2016-03-24 ENCOUNTER — Ambulatory Visit (INDEPENDENT_AMBULATORY_CARE_PROVIDER_SITE_OTHER): Payer: Medicare Other | Admitting: Nurse Practitioner

## 2016-03-24 ENCOUNTER — Encounter: Payer: Self-pay | Admitting: Nurse Practitioner

## 2016-03-24 ENCOUNTER — Ambulatory Visit (INDEPENDENT_AMBULATORY_CARE_PROVIDER_SITE_OTHER): Payer: Medicare Other

## 2016-03-24 VITALS — BP 114/79 | HR 105 | Temp 97.3°F | Ht 68.0 in | Wt 206.0 lb

## 2016-03-24 DIAGNOSIS — R194 Change in bowel habit: Secondary | ICD-10-CM

## 2016-03-24 DIAGNOSIS — I10 Essential (primary) hypertension: Secondary | ICD-10-CM

## 2016-03-24 DIAGNOSIS — R42 Dizziness and giddiness: Secondary | ICD-10-CM | POA: Diagnosis not present

## 2016-03-24 LAB — FINGERSTICK HEMOGLOBIN: Hemoglobin: 16.2 g/dL (ref 12.6–17.7)

## 2016-03-24 MED ORDER — CLOPIDOGREL BISULFATE 75 MG PO TABS: 75.0000 mg | ORAL_TABLET | Freq: Every day | ORAL | Status: DC

## 2016-03-24 MED ORDER — MECLIZINE HCL 25 MG PO TABS: 25.0000 mg | ORAL_TABLET | Freq: Three times a day (TID) | ORAL | Status: DC | PRN

## 2016-03-24 MED ORDER — OLMESARTAN MEDOXOMIL-HCTZ 20-12.5 MG PO TABS: 1.0000 | ORAL_TABLET | Freq: Every day | ORAL | Status: DC

## 2016-03-24 NOTE — Progress Notes (Signed)
   Subjective:    Patient ID: Timothy Duran, male    DOB: 12/24/1945, 70 y.o.   MRN: [id number]  Dizziness This is a recurrent problem. The current episode started 1 to 4 weeks ago. The problem occurs 2 to 4 times per day. The problem has been gradually worsening. Associated symptoms include nausea. Pertinent negatives include no abdominal pain. The symptoms are aggravated by bending. He has tried nothing for the symptoms.   He also says that there has been a change in his bowel movements- stools come out in flat small pieces, black in appearance and has to go frequently- this started about 4 weeks ago and occurs daily.   Review of Systems  Constitutional: Negative.   HENT: Negative.   Respiratory: Negative.   Cardiovascular: Negative.   Gastrointestinal: Positive for nausea. Negative for abdominal pain.  Neurological: Positive for dizziness.  Psychiatric/Behavioral: Negative.   All other systems reviewed and are negative.      Objective:   Physical Exam  Constitutional: He is oriented to person, place, and time. He appears well-developed and well-nourished. No distress.  Cardiovascular: Normal rate, regular rhythm and normal heart sounds.   Pulmonary/Chest: Effort normal and breath sounds normal.  Neurological: He is alert and oriented to person, place, and time. He has normal reflexes. No cranial nerve deficit.  Skin: Skin is warm.  Psychiatric: He has a normal mood and affect. His behavior is normal. Judgment and thought content normal.   KUB- normal-Preliminary reading by Ronnald Collum, FNP  [facility]  HGB 16.2  BP 114/79 mmHg  Pulse 105  Temp(Src) 97.3 F (36.3 C) (Oral)  Ht _0  (1.727 m)  Wt 206 lb (93.441 kg)  BMI 31.33 kg/m2      Assessment & Plan:  1. Vertigo Force fluids - Fingerstick Hemoglobin - meclizine (ANTIVERT) 25 MG tablet; Take 1 tablet (25 mg total) by mouth 3 (three) times daily as needed for dizziness.  Dispense: 30 tablet; Refill: 0 - Ambulatory  referral to Neurology  2. Change in bowel habits Continue o watch until sees GI - CMP14+EGFR - Amylase - Lipase - DG Abd 1 View; Future - Ambulatory referral to Gastroenterology   [place], [place]

## 2016-03-24 NOTE — Patient Instructions (Signed)
Fall Prevention in the Home  Falls can cause injuries and can affect people from all age groups. There are many simple things that you can do to make your home safe and to help prevent falls. WHAT CAN I DO ON THE OUTSIDE OF MY HOME?  Regularly repair the edges of walkways and driveways and fix any cracks.  Remove high doorway thresholds.  Trim any shrubbery on the main path into your home.  Use bright outdoor lighting.  Clear walkways of debris and clutter, including tools and rocks.  Regularly check that handrails are securely fastened and in good repair. Both sides of any steps should have handrails.  Install guardrails along the edges of any raised decks or porches.  Have leaves, snow, and ice cleared regularly.  Use sand or salt on walkways during winter months.  In the garage, clean up any spills right away, including grease or oil spills. WHAT CAN I DO IN THE BATHROOM?  Use night lights.  Install grab bars by the toilet and in the tub and shower. Do not use towel bars as grab bars.  Use non-skid mats or decals on the floor of the tub or shower.  If you need to sit down while you are in the shower, use a plastic, non-slip stool..  Keep the floor dry. Immediately clean up any water that spills on the floor.  Remove soap buildup in the tub or shower on a regular basis.  Attach bath mats securely with double-sided non-slip rug tape.  Remove throw rugs and other tripping hazards from the floor. WHAT CAN I DO IN THE BEDROOM?  Use night lights.  Make sure that a bedside light is easy to reach.  Do not use oversized bedding that drapes onto the floor.  Have a firm chair that has side arms to use for getting dressed.  Remove throw rugs and other tripping hazards from the floor. WHAT CAN I DO IN THE KITCHEN?   Clean up any spills right away.  Avoid walking on wet floors.  Place frequently used items in easy-to-reach places.  If you need to reach for something  above you, use a sturdy step stool that has a grab bar.  Keep electrical cables out of the way.  Do not use floor polish or wax that makes floors slippery. If you have to use wax, make sure that it is non-skid floor wax.  Remove throw rugs and other tripping hazards from the floor. WHAT CAN I DO IN THE STAIRWAYS?  Do not leave any items on the stairs.  Make sure that there are handrails on both sides of the stairs. Fix handrails that are broken or loose. Make sure that handrails are as long as the stairways.  Check any carpeting to make sure that it is firmly attached to the stairs. Fix any carpet that is loose or worn.  Avoid having throw rugs at the top or bottom of stairways, or secure the rugs with carpet tape to prevent them from moving.  Make sure that you have a light switch at the top of the stairs and the bottom of the stairs. If you do not have them, have them installed. WHAT ARE SOME OTHER FALL PREVENTION TIPS?  Wear closed-toe shoes that fit well and support your feet. Wear shoes that have rubber soles or low heels.  When you use a stepladder, make sure that it is completely opened and that the sides are firmly locked. Have someone hold the ladder while you   are using it. Do not climb a closed stepladder.  Add color or contrast paint or tape to grab bars and handrails in your home. Place contrasting color strips on the first and last steps.  Use mobility aids as needed, such as canes, walkers, scooters, and crutches.  Turn on lights if it is dark. Replace any light bulbs that burn out.  Set up furniture so that there are clear paths. Keep the furniture in the same spot.  Fix any uneven floor surfaces.  Choose a carpet design that does not hide the edge of steps of a stairway.  Be aware of any and all pets.  Review your medicines with your healthcare provider. Some medicines can cause dizziness or changes in blood pressure, which increase your risk of falling. Talk  with your health care provider about other ways that you can decrease your risk of falls. This may include working with a physical therapist or trainer to improve your strength, balance, and endurance.   This information is not intended to replace advice given to you by your health care provider. Make sure you discuss any questions you have with your health care provider.   Document Released: 09/25/2002 Document Revised: 02/19/2015 Document Reviewed: 11/09/2014 Elsevier Interactive Patient Education 2016 Elsevier Inc.  

## 2016-03-25 ENCOUNTER — Telehealth: Payer: Self-pay | Admitting: Nurse Practitioner

## 2016-03-25 ENCOUNTER — Encounter: Payer: Self-pay | Admitting: Gastroenterology

## 2016-03-25 DIAGNOSIS — R194 Change in bowel habit: Secondary | ICD-10-CM

## 2016-03-25 LAB — CMP14+EGFR
A/G RATIO: 1.9 (ref 1.2–2.2)
ALT: 15 IU/L (ref 0–44)
AST: 14 IU/L (ref 0–40)
Albumin: 4.3 g/dL (ref 3.5–4.8)
Alkaline Phosphatase: 91 IU/L (ref 39–117)
BILIRUBIN TOTAL: 0.2 mg/dL (ref 0.0–1.2)
BUN/Creatinine Ratio: 18 (ref 10–24)
BUN: 16 mg/dL (ref 8–27)
CALCIUM: 10.6 mg/dL — AB (ref 8.6–10.2)
CHLORIDE: 102 mmol/L (ref 96–106)
CO2: 24 mmol/L (ref 18–29)
Creatinine, Ser: 0.9 mg/dL (ref 0.76–1.27)
GFR calc Af Amer: 98 mL/min/{1.73_m2} (ref >59)
GFR calc non Af Amer: 85 mL/min/{1.73_m2} (ref >59)
GLUCOSE: 96 mg/dL (ref 65–99)
Globulin, Total: 2.3 g/dL (ref 1.5–4.5)
POTASSIUM: 4.5 mmol/L (ref 3.5–5.2)
Sodium: 144 mmol/L (ref 134–144)
Total Protein: 6.6 g/dL (ref 6.0–8.5)

## 2016-03-25 LAB — LIPASE: LIPASE: 34 U/L (ref 0–59)

## 2016-03-25 LAB — AMYLASE: Amylase: 78 U/L (ref 31–124)

## 2016-03-26 NOTE — Telephone Encounter (Signed)
New referreal made to GI

## 2016-04-06 ENCOUNTER — Encounter: Payer: Self-pay | Admitting: Diagnostic Neuroimaging

## 2016-04-06 ENCOUNTER — Ambulatory Visit (INDEPENDENT_AMBULATORY_CARE_PROVIDER_SITE_OTHER): Payer: Medicare Other | Admitting: Diagnostic Neuroimaging

## 2016-04-06 VITALS — BP 121/83 | HR 77 | Ht 68.0 in | Wt 205.6 lb

## 2016-04-06 DIAGNOSIS — G43809 Other migraine, not intractable, without status migrainosus: Secondary | ICD-10-CM

## 2016-04-06 DIAGNOSIS — G43109 Migraine with aura, not intractable, without status migrainosus: Secondary | ICD-10-CM | POA: Diagnosis not present

## 2016-04-06 DIAGNOSIS — R42 Dizziness and giddiness: Secondary | ICD-10-CM | POA: Diagnosis not present

## 2016-04-06 MED ORDER — PROPRANOLOL HCL 20 MG PO TABS: 20.0000 mg | ORAL_TABLET | Freq: Two times a day (BID) | ORAL | Status: DC

## 2016-04-06 NOTE — Patient Instructions (Addendum)
Thank you for coming to see [place] at [facility]. I hope we have been able to provide you high quality care today.  You may receive a patient satisfaction survey over the next few weeks. We would appreciate your feedback and comments so that we may continue to improve ourselves and the health of our patients.  - start propranolol 71m daily; after 1 week increase to twice a day (for migraine prevention) - monitor blood pressure at home - I will check MRI brain scan   ~~~~~~~~~~~~~~~~~~~~~~~~~~~~~~~~~~~~~~~~~~~~~~~~~~~~~~~~~~~~~~~~~  DR. Raif Chachere'S GUIDE TO HAPPY AND HEALTHY LIVING These are some of my general health and wellness recommendations. Some of them may apply to you better than others. Please use common sense as you try these suggestions and feel free to ask me any questions.   ACTIVITY/FITNESS Mental, social, emotional and physical stimulation are very important for brain and body health. Try learning a new activity (arts, music, language, sports, games).  Keep moving your body to the best of your abilities. You can do this at home, inside or outside, the park, community center, gym or anywhere you like. Consider a physical therapist or personal trainer to get started. Consider the app Sworkit. Fitness trackers such as smart-watches, smart-phones or Fitbits can help as well.   NUTRITION Eat more plants: colorful vegetables, nuts, seeds and berries.  Eat less sugar, salt, preservatives and processed foods.  Avoid toxins such as cigarettes and alcohol.  Drink water when you are thirsty. Warm water with a slice of lemon is an excellent morning drink to start the day.  Consider these websites for more information The Nutrition Source (hhttps://www.henry-hernandez.biz/ Precision Nutrition (wWindowBlog.ch   RELAXATION Consider practicing mindfulness meditation or other relaxation techniques such as deep breathing,  prayer, yoga, tai chi, massage. See website mindful.org or the apps Headspace or Calm to help get started.   SLEEP Try to get at least 7-8+ hours sleep per day. Regular exercise and reduced caffeine will help you sleep better. Practice good sleep hygeine techniques. See website sleep.org for more information.   PLANNING Prepare estate planning, living will, healthcare POA documents. Sometimes this is best planned with the help of an attorney. Theconversationproject.org and agingwithdignity.org are excellent resources.

## 2016-04-06 NOTE — Progress Notes (Signed)
[facility]  PATIENT: Timothy Duran DOB: 1946/03/13  REFERRING CLINICIAN: Mary-Margaret Hassell Done, FNP HISTORY FROM: patient and wife REASON FOR VISIT: new consult    HISTORICAL  CHIEF COMPLAINT:  Chief Complaint  Patient presents with  . Vertigo    rm 6, New Pt, wife- Bethena Roys, "dizziness off and on x 2 years, constant in past month, goes away in late afternoon if I sit real still"    HISTORY OF PRESENT ILLNESS:   NEW HPI (04/06/16): 70 year old right-handed male with history of hypercholesterolemia, prostate cancer, kidney stones, lung tumor, TIA, here for evaluation of dizziness spells. In 2012 patient came to see me for similar problems. At that time he was having 2-3 episodes per year. Symptoms had resolved. However in the last 4-6 weeks symptoms have returned with more intensity. Patient reports a seasickness sensation, rocking back and forth, nausea, vomiting. No spinning sensation, numbness or weakness. Sometimes she has occasional headache in the occipital region with aching pressure sensation, nausea. No photophobia or phonophobia. Patient is having episodes on a daily basis. Attacks can last 15-20 minutes at a time. Sometimes changing position seems to trigger the episodes. Patient has family history of migraine in his daughter.  UPDATE (05/25/11): Doing much better; no further dizzy spells.  PRIOR HPI (04/13/11): 70 year old right-handed male with history of hypertension, hypercholesterolemia, prostate cancer, benign lung nodule, here for evaluation of intermittent episodes of dizziness. In 2009 he had a 30 minute episodes of feeling sweaty, dizziness, inability to walk straight. He was diagnosed with TIA and started on aspirin and plavix. Since Jan 2012, he has had 3 episodes lasting 5 minutes each, of severe nausea, vomiting, dizziness, "head spinning" sensation.  These were not triggered by change in head position or orthostatic changes.  He is not exerting himself  from these occurred. He had no chest pain, shortness of breath with these events.  He did feel a wave of heat and numbness from his toes up to his ears during these episodes.    REVIEW OF SYSTEMS: Full 14 system review of systems performed and negative with exception of: Weight gain fatigue swelling in legs blurred vision shortness of breath snoring easy bruising easy bleeding feeling hot increased thirst joint pain cramps aching muscles dizziness weakness headache snoring restless legs.  ALLERGIES: Allergies  Allergen Reactions  . Other     Nasal packing- headaches and facial pain  . Penicillins     Blisters  Has patient had a PCN reaction causing immediate rash, facial/tongue/throat swelling, SOB or lightheadedness with hypotension: (Unknown} Has patient had a PCN reaction causing severe rash involving mucus membranes or skin necrosis: Unknown Has patient had a PCN reaction that required hospitalization No Has patient had a PCN reaction occurring within the last 10 years: No If all of the above answers are "NO", then may proceed with Cephalosporin use.    HOME MEDICATIONS: Outpatient Prescriptions Prior to Visit  Medication Sig Dispense Refill  . acetaminophen (TYLENOL) 325 MG tablet Take 2 tablets (650 mg total) by mouth every 6 (six) hours as needed for mild pain (or Fever >/= 101).    . clopidogrel (PLAVIX) 75 MG tablet Take 1 tablet (75 mg total) by mouth daily. 90 tablet 0  . meclizine (ANTIVERT) 25 MG tablet Take 1 tablet (25 mg total) by mouth 3 (three) times daily as needed for dizziness. 30 tablet 0  . olmesartan-hydrochlorothiazide (BENICAR HCT) 20-12.5 MG tablet Take 1 tablet by mouth daily. 30 tablet 5  .  polyethylene glycol (MIRALAX / GLYCOLAX) packet Take 17 g by mouth daily as needed for mild constipation. (Patient not taking: Reported on 04/06/2016) 14 each 0   No facility-administered medications prior to visit.    PAST MEDICAL HISTORY: Past Medical History    Diagnosis Date  . Hypertension   . Abdominal hernia     "in there now" (05/20/2015)  . TIA (transient ischemic attack) 2005    ?  [facility] [facility] Arthritis     "terrible; all over" (05/20/2015)  . Prostate cancer [facility]) 2010    prostatectomy  . Basal cell carcinoma     "left neck"  . Epistaxis 05/20/2015    hospitalized  . Hypercholesterolemia     PAST SURGICAL HISTORY: Past Surgical History  Procedure Laterality Date  . Video assisted thoracoscopy (vats)/wedge resection Right 10/2009    "upper; benign"  . Prostatectomy  01/2010  . Cystoscopy w/ stone manipulation  1994    "got one stone; still has one lodge in there" (05/20/2015)  . Bladder stone removal  09/2009  . Basal cell carcinoma excision Left 2015    "neck"  . Nasal endoscopy with epistaxis control Left 05/21/2015    Procedure: NASAL ENDOSCOPY WITH EPISTAXIS CONTROL;  Surgeon: Melissa Montane, MD;  Location: [facility];  Service: ENT;  Laterality: Left;  . Incision and drainage abscess Left 06/20/2015    Procedure: INCISION AND DRAINAGE ABSCESS;  Surgeon: Melina Schools, MD;  Location: [facility];  Service: Orthopedics;  Laterality: Left;  . Knee arthrotomy Left 07/20/2015    Procedure: KNEE ARTHROTOMY Incision and Drainage left knee;  Surgeon: Paralee Cancel, MD;  Location: [facility];  Service: Orthopedics;  Laterality: Left;  . Kidney stone surgery  1995  . Shoulder surgery  11/2015    torn ligament    FAMILY HISTORY: Family History  Problem Relation Age of Onset  . Dementia Mother   . Cancer Father     brain, lung, prostate  . Cancer Brother   . Cancer Sister     SOCIAL HISTORY:  Social History   Social History  . Marital Status: Married    Spouse Name: Bethena Roys  . Number of Children: 2  . Years of Education: 12   Occupational History  .      part time- parts dept for [employer]   Social History Main Topics  . Smoking status: Former Smoker -- 1.00 packs/day for 4 years    Types: Cigarettes    Quit date: 08/29/2008  . Smokeless tobacco:  Never Used  . Alcohol Use: No  . Drug Use: No  . Sexual Activity: Not on file   Other Topics Concern  . Not on file   Social History Narrative   Lives with wife   'caffeine use- coffee, 2 cups a day, occas tea     PHYSICAL EXAM  GENERAL EXAM/CONSTITUTIONAL: Vitals:  Filed Vitals:   04/06/16 0841  BP: 121/83  Pulse: 77  Height: 5\' 8"  (1.727 m)  Weight: 205 lb 9.6 oz (93.26 kg)    ORTHOSTATIC VITALS  04/06/16    LYING   137/91, 78   STANDING  137/83, 83    Body mass index is 31.27 kg/(m^2).  Visual Acuity Screening   Right eye Left eye Both eyes  Without correction:     With correction: 20/40 20/50   Comments: 04/06/16 bifocals    Patient is in no distress; well developed, nourished and groomed; neck is supple  CARDIOVASCULAR:  Examination of carotid arteries is  normal; no carotid bruits  Regular rate and rhythm, no murmurs  Examination of peripheral vascular system by observation and palpation is normal  EYES:  Ophthalmoscopic exam of optic discs and posterior segments is normal; no papilledema or hemorrhages  MUSCULOSKELETAL:  Gait, strength, tone, movements noted in Neurologic exam below  NEUROLOGIC: MENTAL STATUS:  No flowsheet data found.  awake, alert, oriented to person, place and time  recent and remote memory intact  normal attention and concentration  language fluent, comprehension intact, naming intact,   fund of knowledge appropriate  CRANIAL NERVE:   2nd - no papilledema on fundoscopic exam  2nd, 3rd, 4th, 6th - pupils equal and reactive to light, visual fields full to confrontation, extraocular muscles intact, no nystagmus  5th - facial sensation symmetric  7th - facial strength symmetric  8th - hearing intact  9th - palate elevates symmetrically, uvula midline  11th - shoulder shrug symmetric  12th - tongue protrusion midline  MOTOR:   normal bulk and tone, full strength in the BUE, BLE  SENSORY:   normal and  symmetric to light touch, temperature, vibration  COORDINATION:   finger-nose-finger, fine finger movements normal  REFLEXES:   deep tendon reflexes TRACE and symmetric  GAIT/STATION:   narrow based gait; romberg is negative    DIAGNOSTIC DATA (LABS, IMAGING, TESTING) - I reviewed patient records, labs, notes, testing and imaging myself where available.  Lab Results  Component Value Date   WBC 11.8* 07/21/2015   HGB 14.3 07/21/2015   HCT 43.6 07/21/2015   MCV 92.8 07/21/2015   PLT 190 07/21/2015      Component Value Date/Time   NA 144 03/24/2016 1443   NA 134* 07/21/2015 0516   K 4.5 03/24/2016 1443   CL 102 03/24/2016 1443   CO2 24 03/24/2016 1443   GLUCOSE 96 03/24/2016 1443   GLUCOSE 120* 07/21/2015 0516   BUN 16 03/24/2016 1443   BUN 21* 07/21/2015 0516   CREATININE 0.90 03/24/2016 1443   CALCIUM 10.6* 03/24/2016 1443   PROT 6.6 03/24/2016 1443   PROT 5.3* 11/03/2009 0339   ALBUMIN 4.3 03/24/2016 1443   ALBUMIN 3.0* 11/03/2009 0339   AST 14 03/24/2016 1443   ALT 15 03/24/2016 1443   ALKPHOS 91 03/24/2016 1443   BILITOT 0.2 03/24/2016 1443   BILITOT 0.2 06/22/2014 0845   GFRNONAA 85 03/24/2016 1443   GFRAA 98 03/24/2016 1443   Lab Results  Component Value Date   CHOL 210* 08/13/2015   HDL 46 08/13/2015   LDLCALC 137* 08/13/2015   TRIG 135 08/13/2015   CHOLHDL 4.6 08/13/2015   No results found for: HGBA1C No results found for: VITAMINB12 No results found for: TSH   04/29/11 MRI brain (without contrast)  - chronic right basal ganglia infarction with hemosiderin staining.  04/29/11 MRA head  - normal  04/29/11 MRA neck  - normal     ASSESSMENT AND PLAN  70 y.o. year old male here with intermittent dizzy episodes with C6 sensation, nausea, vomiting, headaches. Could represent peripheral vestibulopathy, vestibular migraine, other secondary cause. We'll check MRI brain and try patient on migraine prophylaxis.   Dx:  1. Dizziness and  giddiness   2. Vestibular migraine      PLAN:  Orders Placed This Encounter  Procedures  . MR Brain/IAC Wo/W Cm    Meds ordered this encounter  Medications  . propranolol (INDERAL) 20 MG tablet    Sig: Take 1 tablet (20 mg total) by mouth 2 (  two) times daily.    Dispense:  60 tablet    Refill:  6   Return in about 6 weeks (around 05/18/2016).    Penni Bombard, MD [id number], [id number] AM Certified in Neurology, Neurophysiology and Neuroimaging  [facility] [address] [place], [place] [address] [phone]

## 2016-04-30 ENCOUNTER — Telehealth: Payer: Self-pay | Admitting: Nurse Practitioner

## 2016-05-01 ENCOUNTER — Inpatient Hospital Stay: Admission: RE | Admit: 2016-05-01 | Payer: Medicare Other | Source: Ambulatory Visit

## 2016-05-05 NOTE — Telephone Encounter (Signed)
scheduled

## 2016-05-06 ENCOUNTER — Ambulatory Visit (INDEPENDENT_AMBULATORY_CARE_PROVIDER_SITE_OTHER): Payer: Medicare Other | Admitting: Pediatrics

## 2016-05-06 ENCOUNTER — Encounter: Payer: Self-pay | Admitting: Pediatrics

## 2016-05-06 VITALS — BP 114/65 | HR 80 | Temp 97.6°F | Ht 68.0 in

## 2016-05-06 DIAGNOSIS — I10 Essential (primary) hypertension: Secondary | ICD-10-CM | POA: Diagnosis not present

## 2016-05-06 DIAGNOSIS — M5442 Lumbago with sciatica, left side: Secondary | ICD-10-CM | POA: Diagnosis not present

## 2016-05-06 MED ORDER — MELOXICAM 7.5 MG PO TABS: 7.5000 mg | ORAL_TABLET | Freq: Every day | ORAL | Status: DC

## 2016-05-06 NOTE — Patient Instructions (Addendum)
Aspercream or anacream or similar with anti-inflammatory medicine in it twice a day Tylenol at least twice a day meloxicam once a day Heating pad Ice as needed

## 2016-05-06 NOTE — Progress Notes (Signed)
    Subjective:    Patient ID: Timothy Duran, male    DOB: 1945-12-15, 70 y.o.   MRN: [id number]  CC: Back Pain   HPI: Timothy Duran is a 70 y.o. male presenting for Back Pain  9 months ago had septic joint had IV antibiotics Tore a rotator cuff, had it fixed Was limping for a while due to knee pain and low back pain started around that time Having low back pain, pain going down  This morning was walking into the house from doing some work outside and felt a spasm of pain in his back Back pain comes and goes over past few months Some weeks worse than others hasnt taken anything for it   Depression screen [facility] 2/9 05/06/2016 11/05/2015 08/22/2015 07/11/2015 05/13/2015  Decreased Interest 0 0 0 0 0  Down, Depressed, Hopeless 0 0 0 0 0  PHQ - 2 Score 0 0 0 0 0     Relevant past medical, surgical, family and social history reviewed and updated as indicated.  Interim medical history since our last visit reviewed. Allergies and medications reviewed and updated.  ROS: Per HPI unless specifically indicated above  History  Smoking status  . Former Smoker -- 1.00 packs/day for 4 years  . Types: Cigarettes  . Quit date: 08/29/2008  Smokeless tobacco  . Never Used       Objective:    BP 114/65 mmHg  Pulse 80  Temp(Src) 97.6 F (36.4 C) (Oral)  Ht 5\' 8"  (1.727 m)  Wt Readings from Last 3 Encounters:  04/06/16 205 lb 9.6 oz (93.26 kg)  03/24/16 206 lb (93.441 kg)  11/05/15 193 lb 8 oz (87.771 kg)     Gen: NAD, alert, cooperative with exam, NCAT EYES: EOMI, no scleral injection or icterus CV: NRRR, normal S1/S2, no murmur Resp: CTABL, no wheezes, normal WOB Ext: No edema, warm Neuro: Alert and oriented, strength equal b/l LE MSK: normal muscle bulk, tender with palpation L lower paraspinous muscles. No pain with SLR, no point tenderness over spiny processes     Assessment & Plan:    Yitzhak was seen today for back pain. No red flag symptoms. Tylenol twice a day. Mobic as below  and aspercream, heating pad.  Diagnoses and all orders for this visit:  Left-sided low back pain with left-sided sciatica -     meloxicam (MOBIC) 7.5 MG tablet; Take 1 tablet (7.5 mg total) by mouth daily.  HTN well controlled today, cont current meds  H/o TIAs on plavix  Follow up plan: Return in about 4 weeks (around 06/03/2016).  Assunta Found, MD [facility] 05/06/2016, 3:29 PM

## 2016-05-12 ENCOUNTER — Ambulatory Visit
Admission: RE | Admit: 2016-05-12 | Discharge: 2016-05-12 | Disposition: A | Discharge status: Home / Self Care | Payer: Medicare Other | Source: Ambulatory Visit | Attending: Diagnostic Neuroimaging | Admitting: Diagnostic Neuroimaging

## 2016-05-12 DIAGNOSIS — R42 Dizziness and giddiness: Secondary | ICD-10-CM

## 2016-05-12 DIAGNOSIS — G43109 Migraine with aura, not intractable, without status migrainosus: Secondary | ICD-10-CM | POA: Diagnosis not present

## 2016-05-12 DIAGNOSIS — G43809 Other migraine, not intractable, without status migrainosus: Secondary | ICD-10-CM

## 2016-05-12 MED ORDER — GADOBENATE DIMEGLUMINE 529 MG/ML IV SOLN
18.0000 mL | Freq: Once | INTRAVENOUS | Status: AC | PRN
  Administered 2016-05-12: 18 mL via INTRAVENOUS

## 2016-05-14 ENCOUNTER — Telehealth: Payer: Self-pay | Admitting: *Deleted

## 2016-05-14 NOTE — Telephone Encounter (Signed)
Per Dr Leta Baptist, LVM informing patient his MRI showed no major findings, results are unremarkable, continue with current treatment plan. Left name and number for any quesitons.

## 2016-05-18 ENCOUNTER — Encounter: Payer: Self-pay | Admitting: Diagnostic Neuroimaging

## 2016-05-18 ENCOUNTER — Ambulatory Visit (INDEPENDENT_AMBULATORY_CARE_PROVIDER_SITE_OTHER): Payer: Medicare Other | Admitting: Diagnostic Neuroimaging

## 2016-05-18 VITALS — BP 122/83 | HR 80 | Wt 207.8 lb

## 2016-05-18 DIAGNOSIS — R42 Dizziness and giddiness: Secondary | ICD-10-CM | POA: Diagnosis not present

## 2016-05-18 DIAGNOSIS — G43109 Migraine with aura, not intractable, without status migrainosus: Secondary | ICD-10-CM

## 2016-05-18 DIAGNOSIS — G43809 Other migraine, not intractable, without status migrainosus: Secondary | ICD-10-CM

## 2016-05-18 MED ORDER — PROPRANOLOL HCL 20 MG PO TABS: 20.0000 mg | ORAL_TABLET | Freq: Two times a day (BID) | ORAL | 12 refills | Status: DC

## 2016-05-18 NOTE — Progress Notes (Signed)
[facility]  PATIENT: Timothy Duran DOB: [birth year]  REFERRING CLINICIAN: Mary-Margaret Hassell Done, FNP HISTORY FROM: patient and wife REASON FOR VISIT: follow up   HISTORICAL  CHIEF COMPLAINT:  Chief Complaint  Patient presents with  . Dizziness    rm 7, wife- Bethena Roys, "still light headed and some dizziness but not like it was; review MRI results"  . Follow-up    6 week    HISTORY OF PRESENT ILLNESS:   UPDATE 05/18/16: Since last visit, doing much better. Dizzy attacks essentially resolved. Tolerating propranolol well. BP reading range SBP 100-140 ever last few weeks. AM BP tends to be lower.   NEW HPI (04/06/16): 70 year old right-handed male with history of hypercholesterolemia, prostate cancer, kidney stones, lung tumor, TIA, here for evaluation of dizziness spells. In 2012 patient came to see me for similar problems. At that time he was having 2-3 episodes per year. Symptoms had resolved. However in the last 4-6 weeks symptoms have returned with more intensity. Patient reports a seasickness sensation, rocking back and forth, nausea, vomiting. No spinning sensation, numbness or weakness. Sometimes she has occasional headache in the occipital region with aching pressure sensation, nausea. No photophobia or phonophobia. Patient is having episodes on a daily basis. Attacks can last 15-20 minutes at a time. Sometimes changing position seems to trigger the episodes. Patient has family history of migraine in his daughter.  UPDATE (05/25/11): Doing much better; no further dizzy spells.  PRIOR HPI (04/13/11): 70 year old right-handed male with history of hypertension, hypercholesterolemia, prostate cancer, benign lung nodule, here for evaluation of intermittent episodes of dizziness. In 2009 he had a 30 minute episodes of feeling sweaty, dizziness, inability to walk straight. He was diagnosed with TIA and started on aspirin and plavix. Since Jan 2012, he has had 3 episodes lasting 5  minutes each, of severe nausea, vomiting, dizziness, "head spinning" sensation.  These were not triggered by change in head position or orthostatic changes.  He is not exerting himself from these occurred. He had no chest pain, shortness of breath with these events.  He did feel a wave of heat and numbness from his toes up to his ears during these episodes.    REVIEW OF SYSTEMS: Full 14 system review of systems performed and negative with exception of: dizziness.  ALLERGIES: Allergies  Allergen Reactions  . Other     Nasal packing- headaches and facial pain  . Penicillins     Blisters  Has patient had a PCN reaction causing immediate rash, facial/tongue/throat swelling, SOB or lightheadedness with hypotension: (Unknown} Has patient had a PCN reaction causing severe rash involving mucus membranes or skin necrosis: Unknown Has patient had a PCN reaction that required hospitalization No Has patient had a PCN reaction occurring within the last 10 years: No If all of the above answers are "NO", then may proceed with Cephalosporin use.    HOME MEDICATIONS: Outpatient Medications Prior to Visit  Medication Sig Dispense Refill  . clopidogrel (PLAVIX) 75 MG tablet Take 1 tablet (75 mg total) by mouth daily. 90 tablet 0  . meclizine (ANTIVERT) 25 MG tablet Take 1 tablet (25 mg total) by mouth 3 (three) times daily as needed for dizziness. 30 tablet 0  . olmesartan-hydrochlorothiazide (BENICAR HCT) 20-12.5 MG tablet Take 1 tablet by mouth daily. 30 tablet 5  . propranolol (INDERAL) 20 MG tablet Take 1 tablet (20 mg total) by mouth 2 (two) times daily. 60 tablet 6  . acetaminophen (TYLENOL) 325 MG tablet Take 2  tablets (650 mg total) by mouth every 6 (six) hours as needed for mild pain (or Fever >/= 101).    . meloxicam (MOBIC) 7.5 MG tablet Take 1 tablet (7.5 mg total) by mouth daily. 30 tablet 0   No facility-administered medications prior to visit.     PAST MEDICAL HISTORY: Past Medical  History:  Diagnosis Date  . Abdominal hernia    "in there now" (05/20/2015)  . Arthritis    "terrible; all over" (05/20/2015)  . Basal cell carcinoma    "left neck"  . Epistaxis 05/20/2015   hospitalized  . Hypercholesterolemia   . Hypertension   . Prostate cancer [facility]) 2010   prostatectomy  . TIA (transient ischemic attack) 2005   ?    PAST SURGICAL HISTORY: Past Surgical History:  Procedure Laterality Date  . BASAL CELL CARCINOMA EXCISION Left 2015   "neck"  . BLADDER STONE REMOVAL  09/2009  . [place]   "got one stone; still has one lodge in there" (05/20/2015)  . INCISION AND DRAINAGE ABSCESS Left 06/20/2015   Procedure: INCISION AND DRAINAGE ABSCESS;  Surgeon: Melina Schools, MD;  Location: [facility];  Service: Orthopedics;  Laterality: Left;  . [place]  . KNEE ARTHROTOMY Left 07/20/2015   Procedure: KNEE ARTHROTOMY Incision and Drainage left knee;  Surgeon: Paralee Cancel, MD;  [facility];  Service: Orthopedics;  Laterality: Left;  . NASAL ENDOSCOPY WITH EPISTAXIS CONTROL Left 05/21/2015   Procedure: NASAL ENDOSCOPY WITH EPISTAXIS CONTROL;  Surgeon: Melissa Montane, MD;  Location: [facility];  Service: ENT;  Laterality: Left;  . PROSTATECTOMY  01/2010  . SHOULDER SURGERY  11/2015   torn ligament  . VIDEO ASSISTED THORACOSCOPY (VATS)/WEDGE RESECTION Right 10/2009   "upper; benign"    FAMILY HISTORY: Family History  Problem Relation Age of Onset  . Dementia Mother   . Cancer Father     brain, lung, prostate  . Cancer Brother   . Cancer Sister     SOCIAL HISTORY:  Social History   Social History  . Marital status: Married    Spouse name: Bethena Roys  . Number of children: 2  . Years of education: 12   Occupational History  .      part time- parts dept for [employer]   Social History Main Topics  . Smoking status: Former Smoker    Packs/day: 1.00    Years: 4.00    Types: Cigarettes    Quit date: 08/29/2008  . Smokeless tobacco:  Never Used  . Alcohol use No  . Drug use: No  . Sexual activity: Not on file   Other Topics Concern  . Not on file   Social History Narrative   Lives with wife   'caffeine use- coffee, 2 cups a day, occas tea     PHYSICAL EXAM  GENERAL EXAM/CONSTITUTIONAL: Vitals:  Vitals:   05/18/16 1259  BP: 122/83  Pulse: 80  Weight: 207 lb 12.8 oz (94.3 kg)    ORTHOSTATIC VITALS  04/06/16    LYING   137/91, 78   STANDING  137/83, 83   Body mass index is 31.6 kg/m. No exam data present  Patient is in no distress; well developed, nourished and groomed; neck is supple  CARDIOVASCULAR:  Examination of carotid arteries is normal; no carotid bruits  Regular rate and rhythm, no murmurs  Examination of peripheral vascular system by observation and palpation is normal  EYES:  Ophthalmoscopic exam of  optic discs and posterior segments is normal; no papilledema or hemorrhages  MUSCULOSKELETAL:  Gait, strength, tone, movements noted in Neurologic exam below  NEUROLOGIC: MENTAL STATUS:  No flowsheet data found.  awake, alert, oriented to person, place and time  recent and remote memory intact  normal attention and concentration  language fluent, comprehension intact, naming intact,   fund of knowledge appropriate  CRANIAL NERVE:   2nd - no papilledema on fundoscopic exam  2nd, 3rd, 4th, 6th - pupils equal and reactive to light, visual fields full to confrontation, extraocular muscles intact, no nystagmus  5th - facial sensation symmetric  7th - facial strength symmetric  8th - hearing intact  9th - palate elevates symmetrically, uvula midline  11th - shoulder shrug symmetric  12th - tongue protrusion midline  MOTOR:   normal bulk and tone, full strength in the BUE, BLE  SENSORY:   normal and symmetric to light touch, temperature, vibration  COORDINATION:   finger-nose-finger, fine finger movements normal  REFLEXES:   deep tendon reflexes TRACE  and symmetric  GAIT/STATION:   narrow based gait; romberg is negative    DIAGNOSTIC DATA (LABS, IMAGING, TESTING) - I reviewed patient records, labs, notes, testing and imaging myself where available.  Lab Results  Component Value Date   WBC 11.8 (H) 07/21/2015   HGB 14.3 07/21/2015   HCT 43.6 07/21/2015   MCV 92.8 07/21/2015   PLT 190 07/21/2015      Component Value Date/Time   NA 144 03/24/2016 1443   K 4.5 03/24/2016 1443   CL 102 03/24/2016 1443   CO2 24 03/24/2016 1443   GLUCOSE 96 03/24/2016 1443   GLUCOSE 120 (H) 07/21/2015 0516   BUN 16 03/24/2016 1443   CREATININE 0.90 03/24/2016 1443   CALCIUM 10.6 (H) 03/24/2016 1443   PROT 6.6 03/24/2016 1443   ALBUMIN 4.3 03/24/2016 1443   AST 14 03/24/2016 1443   ALT 15 03/24/2016 1443   ALKPHOS 91 03/24/2016 1443   BILITOT 0.2 03/24/2016 1443   GFRNONAA 85 03/24/2016 1443   GFRAA 98 03/24/2016 1443   Lab Results  Component Value Date   CHOL 210 (H) 08/13/2015   HDL 46 08/13/2015   LDLCALC 137 (H) 08/13/2015   TRIG 135 08/13/2015   CHOLHDL 4.6 08/13/2015   No results found for: HGBA1C No results found for: VITAMINB12 No results found for: TSH   04/29/11 MRI brain (without contrast)  - chronic right basal ganglia infarction with hemosiderin staining.  04/29/11 MRA head  - normal  04/29/11 MRA neck  - normal  05/12/16 MRI brain and IAC [I reviewed images myself and agree with interpretation. -[facility]]  1.   Mild age related cortical atrophy and mild chronic microvascular ischemic change.   None of the changes appear to be acute. The atrophy is slightly increased compared to 04/30/2011 and the microvascular ischemic changes are similar. 2.   Mild dolichoectasia of the left vertebral and basilar arteries. 3.   The internal auditory canals appear normal before and after contrast administration. 4.    There are no acute findings.    ASSESSMENT AND PLAN  70 y.o. year old male here with intermittent dizzy episodes  with C6 sensation, nausea, vomiting, headaches. Could represent peripheral vestibulopathy, vestibular migraine, other secondary cause. Doing better with propranolol migraine prophylaxis.    Dx:  1. Dizziness and giddiness   2. Vestibular migraine      PLAN: - continue propranolol 20mg  twice a day - monitor symptoms  Meds ordered this encounter  Medications  . propranolol (INDERAL) 20 MG tablet    Sig: Take 1 tablet (20 mg total) by mouth 2 (two) times daily.    Dispense:  60 tablet    Refill:  12   Return in about 6 months (around 11/18/2016).    Penni Bombard, MD [id number], [id number] PM Certified in Neurology, Neurophysiology and Neuroimaging  [facility] [address] [place], [place] [address] [phone]

## 2016-05-18 NOTE — Patient Instructions (Signed)
-   continue propranolol 20mg  twice a day

## 2016-05-19 ENCOUNTER — Encounter: Payer: Medicare Other | Admitting: Nurse Practitioner

## 2016-05-25 ENCOUNTER — Ambulatory Visit: Payer: Medicare Other | Admitting: Gastroenterology

## 2016-06-11 ENCOUNTER — Ambulatory Visit (INDEPENDENT_AMBULATORY_CARE_PROVIDER_SITE_OTHER): Payer: Medicare Other | Admitting: Gastroenterology

## 2016-06-11 ENCOUNTER — Encounter: Payer: Self-pay | Admitting: Gastroenterology

## 2016-06-11 ENCOUNTER — Telehealth: Payer: Self-pay

## 2016-06-11 VITALS — BP 110/70 | HR 80 | Ht 65.75 in | Wt 210.0 lb

## 2016-06-11 DIAGNOSIS — R194 Change in bowel habit: Secondary | ICD-10-CM | POA: Diagnosis not present

## 2016-06-11 DIAGNOSIS — R1030 Lower abdominal pain, unspecified: Secondary | ICD-10-CM

## 2016-06-11 DIAGNOSIS — R198 Other specified symptoms and signs involving the digestive system and abdomen: Secondary | ICD-10-CM

## 2016-06-11 DIAGNOSIS — Z800 Family history of malignant neoplasm of digestive organs: Secondary | ICD-10-CM | POA: Diagnosis not present

## 2016-06-11 MED ORDER — DICYCLOMINE HCL 10 MG PO CAPS: 10.0000 mg | ORAL_CAPSULE | Freq: Three times a day (TID) | ORAL | 1 refills | Status: DC | PRN

## 2016-06-11 MED ORDER — NA SULFATE-K SULFATE-MG SULF 17.5-3.13-1.6 GM/177ML PO SOLN: 1.0000 | Freq: Once | ORAL | 0 refills | Status: AC

## 2016-06-11 NOTE — Progress Notes (Signed)
HPI :  70 y/o male with  PMH of TIA on plavix, history of prostate CA s/p prostatectomy in 2010 or so, and arthritis, here for abdominal pains and changes in bowel habits.   He reports some changes in his bowel habits for the past 3-4 months. No blood in the stools. He reports the stool caliber has changed, short and flat, and associated with a sense of incomplete evacuation which leads to the urge to defecate multiple times to feel evacuated. He usually has multiple bowel movements in the morning - he has roughly 5 BMs in the morning until he feels he is completely emptied, and then none the rest of the day. Sister had colon cancer at age 61s or so. He has some straining, but not significant. He has not tried anything for his bowels.  He endorses some occasional LLQ pain, RLQ pain, and RUQ pains - he thinks it feels "full everywhere" in this area, occurs about 2-3 times per week. He has a history of VATS procedure in 2011 and has had chronic discomfort in the RUQ since that time, usually positional.  He has gone from 32 to 38 waist over 18 months or so, he thinks he has gained about 40 lbs or so.  If he has a bowel movement it will relieve his abdominal pains if present, but most of the time his pains are later in the day and not related to his bowels.   He has vertigo otherwise followed by Neurology and having some nausea with this. No vomiting. He is eating okay otherwise. Good appetite. He has had a TIA in 2006 or so.   He has had an abdominal xray done in June showing moderate stool burden and renal stone. CT scan abdomen done July 2016 which did not show any acute findings.   Colonoscopy 01/2009 - sigmoid polyp removed - no colonic tissue reported in the specimen, diverticulosis.   Past Medical History:  Diagnosis Date  . Abdominal hernia    "in there now" (05/20/2015)  . Arthritis    "terrible; all over" (05/20/2015)  . Basal cell carcinoma    "left neck"  . Diverticulosis   . Epistaxis  05/20/2015   hospitalized  . Hypercholesterolemia   . Hypertension   . Kidney stone   . Prostate cancer [facility]) 2010   prostatectomy  . TIA (transient ischemic attack) 2005   ?     Past Surgical History:  Procedure Laterality Date  . BASAL CELL CARCINOMA EXCISION Left 2015   "neck"  . BLADDER STONE REMOVAL  09/2009  . [place]   "got one stone; still has one lodge in there" (05/20/2015)  . INCISION AND DRAINAGE ABSCESS Left 06/20/2015   Procedure: INCISION AND DRAINAGE ABSCESS;  Surgeon: Melina Schools, MD;  Location: [facility];  Service: Orthopedics;  Laterality: Left;  . [place]  . KNEE ARTHROTOMY Left 07/20/2015   Procedure: KNEE ARTHROTOMY Incision and Drainage left knee;  Surgeon: Paralee Cancel, MD;  Location: [facility];  Service: Orthopedics;  Laterality: Left;  . NASAL ENDOSCOPY WITH EPISTAXIS CONTROL Left 05/21/2015   Procedure: NASAL ENDOSCOPY WITH EPISTAXIS CONTROL;  Surgeon: Melissa Montane, MD;  Location: [facility];  Service: ENT;  Laterality: Left;  . PROSTATECTOMY  01/2010  . SHOULDER SURGERY  11/2015   torn ligament  . VIDEO ASSISTED THORACOSCOPY (VATS)/WEDGE RESECTION Right 10/2009   "upper; benign"   Family History  Problem Relation Age of Onset  .  Dementia Mother   . Prostate cancer Father     eye, lung, prostate  . Prostate cancer Brother   . Colon cancer Sister    Social History  Substance Use Topics  . Smoking status: Former Smoker    Packs/day: 1.00    Years: 4.00    Types: Cigarettes    Quit date: 08/29/2008  . Smokeless tobacco: Never Used  . Alcohol use No   Current Outpatient Prescriptions  Medication Sig Dispense Refill  . clopidogrel (PLAVIX) 75 MG tablet Take 1 tablet (75 mg total) by mouth daily. 90 tablet 0  . meloxicam (MOBIC) 7.5 MG tablet Take 7.5 mg by mouth daily.    Marland Kitchen olmesartan-hydrochlorothiazide (BENICAR HCT) 20-12.5 MG tablet Take 1 tablet by mouth daily. 30 tablet 5  . propranolol (INDERAL) 20 MG tablet  Take 1 tablet (20 mg total) by mouth 2 (two) times daily. 60 tablet 12   No current facility-administered medications for this visit.    Allergies  Allergen Reactions  . Other     Nasal packing- headaches and facial pain  . Penicillins     Blisters  Has patient had a PCN reaction causing immediate rash, facial/tongue/throat swelling, SOB or lightheadedness with hypotension: (Unknown} Has patient had a PCN reaction causing severe rash involving mucus membranes or skin necrosis: Unknown Has patient had a PCN reaction that required hospitalization No Has patient had a PCN reaction occurring within the last 10 years: No If all of the above answers are "NO", then may proceed with Cephalosporin use.     Review of Systems: All systems reviewed and negative except where noted in HPI.   Lab Results  Component Value Date   WBC 11.8 (H) 07/21/2015   HGB 14.3 07/21/2015   HCT 43.6 07/21/2015   MCV 92.8 07/21/2015   PLT 190 07/21/2015    Lab Results  Component Value Date   CREATININE 0.90 03/24/2016   BUN 16 03/24/2016   NA 144 03/24/2016   K 4.5 03/24/2016   CL 102 03/24/2016   CO2 24 03/24/2016    Lab Results  Component Value Date   ALT 15 03/24/2016   AST 14 03/24/2016   ALKPHOS 91 03/24/2016   BILITOT 0.2 03/24/2016     Physical Exam: BP 110/70 (BP Location: Left Arm, Patient Position: Sitting, Cuff Size: Normal)   Pulse 80   Ht 5' 5.75" (1.67 m)   Wt 210 lb (95.3 kg)   BMI 34.15 kg/m  Constitutional: Pleasant,well-developed, male in no acute distress. HEENT: Normocephalic and atraumatic. Conjunctivae are normal. No scleral icterus. Neck supple.  Cardiovascular: Normal rate, regular rhythm.  Pulmonary/chest: Effort normal and breath sounds normal. No wheezing, rales or rhonchi. Abdominal: Soft, prortuberant / obese abdomen, nontender. Large diastasis recti hernia, Bowel sounds active throughout. There are no masses palpable. Unable to reproduce pain with  palpation Extremities: no edema Lymphadenopathy: No cervical adenopathy noted. Neurological: Alert and oriented to person place and time. Skin: Skin is warm and dry. No rashes noted. Psychiatric: Normal mood and affect. Behavior is normal.   ASSESSMENT AND PLAN: 70 y/o male with a FH of colon cancer, presenting with bowel habit changes and intermittent lower abdominal pain, which can be relieved with a bowel movement. He has chronic RUQ pain which has been bothering him since a VATS surgery and likely neuropathic. He's otherwise had a colonoscopy done in 2010 in which he had a polyp removed but no colonic mucosa was noted, and would treat this as  an adenoma given the polyp sample appeared lost.   At this time recommend a colonoscopy in light of these symptoms, and prior colonoscopy results. In the interim will give him a trial of Bentyl and recommend a daily fiber supplement.  I discussed risks / benefits of the procedures to the patient and he agreed to proceed. Further recommendations pending the results. We will seek approval to hold plavix for 5 days prior to the procedure, patient can take an aspirin during this time.   Emery Cellar, MD [facility] Pager [phone]  CC: Hassell Done, Mary-Margaret, *

## 2016-06-11 NOTE — Telephone Encounter (Signed)
  Dear Chevis Pretty, FNP,    We have scheduled the above patient for an endoscopic procedure. Our records show that he is on anticoagulation therapy.   Please advise as to how long the patient may come off his therapy of Plavix prior to the procedure, which is scheduled for 06/25/16.  Please route your answer to Marlon Pel, CMA   Sincerely,    Marlon Pel

## 2016-06-11 NOTE — Patient Instructions (Signed)
You have been scheduled for a colonoscopy. Please follow written instructions given to you at your visit today.  Please pick up your prep supplies at the pharmacy within the next 1-3 days. If you use inhalers (even only as needed), please bring them with you on the day of your procedure. Your physician has requested that you go to www.startemmi.com and enter the access code given to you at your visit today. This web site gives a general overview about your procedure. However, you should still follow specific instructions given to you by our office regarding your preparation for the procedure.  You will be contacted by our office prior to your procedure for directions on holding your Plavix.  If you do not hear from our office 1 week prior to your scheduled procedure, please call [phone] to discuss.  Start a baby aspirin 81 mg daily while you are off your Plavix.   Start a daily fiber supplement.   We have sent the following medications to your pharmacy for you to pick up at your convenience:Bentyl.

## 2016-06-12 ENCOUNTER — Encounter: Payer: Self-pay | Admitting: Gastroenterology

## 2016-06-12 NOTE — Telephone Encounter (Signed)
It is okay for him to stop plavix 3-5 days prior to precedure and start back day after procedure

## 2016-06-12 NOTE — Telephone Encounter (Signed)
Spoke with Remonia Richter and informed her that patient can hold his Plavix 5 days prior to his procedure per Mary-Margaret Hassell Done, NP. Charlett Nose verbalized understanding and states she will notify patient.

## 2016-06-19 ENCOUNTER — Telehealth: Payer: Self-pay | Admitting: Nurse Practitioner

## 2016-06-19 NOTE — Telephone Encounter (Signed)
Patient aware to stop plavix 5 days prior to colonoscopy

## 2016-06-19 NOTE — Telephone Encounter (Signed)
That is fine 

## 2016-06-25 ENCOUNTER — Encounter: Payer: Self-pay | Admitting: Gastroenterology

## 2016-06-25 ENCOUNTER — Ambulatory Visit (AMBULATORY_SURGERY_CENTER): Payer: Medicare Other | Admitting: Gastroenterology

## 2016-06-25 VITALS — BP 149/65 | HR 86 | Temp 99.1°F | Resp 20 | Ht 68.0 in | Wt 210.0 lb

## 2016-06-25 DIAGNOSIS — R194 Change in bowel habit: Secondary | ICD-10-CM | POA: Diagnosis present

## 2016-06-25 DIAGNOSIS — Z800 Family history of malignant neoplasm of digestive organs: Secondary | ICD-10-CM | POA: Diagnosis not present

## 2016-06-25 DIAGNOSIS — K621 Rectal polyp: Secondary | ICD-10-CM | POA: Diagnosis not present

## 2016-06-25 DIAGNOSIS — Z86010 Personal history of colonic polyps: Secondary | ICD-10-CM | POA: Diagnosis not present

## 2016-06-25 DIAGNOSIS — D128 Benign neoplasm of rectum: Secondary | ICD-10-CM

## 2016-06-25 DIAGNOSIS — D129 Benign neoplasm of anus and anal canal: Secondary | ICD-10-CM

## 2016-06-25 MED ORDER — SODIUM CHLORIDE 0.9 % IV SOLN: 500.0000 mL | INTRAVENOUS | Status: DC

## 2016-06-25 NOTE — Progress Notes (Signed)
1638  Pt. Has been up to walk in hall and sitting on commode.  He is passing air. Dr. Havery Moros has been in to check on him several times post procedure.  He states that he has belly pain almost every day after eating.  He rates his pain as A 4.   Dr. Havery Moros states it is OK for pt. To be discharged.

## 2016-06-25 NOTE — Progress Notes (Signed)
Pt. Distended and tight after colonoscopy.  Has passed some air, but still very Distended and uncomfortable.  Moans with movement.  Dr. Havery Moros in. May give infant simethicone drops up to 30 mls if gas air continues to  Be retained.  Verbal order Dr. Jackie Plum RN.

## 2016-06-25 NOTE — Op Note (Signed)
[place] Patient Name: Timothy Duran Procedure Date: 06/25/2016 3:02 PM MRN: [id number] Endoscopist: Remo Lipps P. Havery Moros , MD Age: 70 Referring MD:  Date of Birth: 1946/07/24 Gender: Male Account #: [id number] Procedure:                Colonoscopy Indications:              Change in bowel habits, history of colon polyps,                            family history of colon cancer (sister age 60) Medicines:                Monitored Anesthesia Care Procedure:                Pre-Anesthesia Assessment:                           - Prior to the procedure, a History and Physical                            was performed, and patient medications and                            allergies were reviewed. The patient's tolerance of                            previous anesthesia was also reviewed. The risks                            and benefits of the procedure and the sedation                            options and risks were discussed with the patient.                            All questions were answered, and informed consent                            was obtained. Prior Anticoagulants: The patient has                            taken Plavix (clopidogrel), last dose was 5 days                            prior to procedure. ASA Grade Assessment: III - A                            patient with severe systemic disease. After                            reviewing the risks and benefits, the patient was                            deemed in satisfactory condition to undergo the  procedure.                           After obtaining informed consent, the colonoscope                            was passed under direct vision. Throughout the                            procedure, the patient's blood pressure, pulse, and                            oxygen saturations were monitored continuously. The                            Model CF-HQ190L [id number]) scope was introduced                             through the anus and advanced to the the cecum,                            identified by appendiceal orifice and ileocecal                            valve. The colonoscopy was technically difficult                            and complex due to significant looping. The patient                            tolerated the procedure well. The quality of the                            bowel preparation was adequate. The ileocecal                            valve, appendiceal orifice, and rectum were                            photographed. Scope In: 3:12:32 PM Scope Out: 3:34:37 PM Scope Withdrawal Time: 0 hours 13 minutes 12 seconds  Total Procedure Duration: 0 hours 22 minutes 5 seconds  Findings:                 The perianal and digital rectal examinations were                            normal.                           Scattered medium-mouthed diverticula were found in                            the entire colon.  A 3 mm polyp was found in the rectum. The polyp was                            sessile. The polyp was removed with a cold biopsy                            forceps. Resection and retrieval were complete.                           Non-bleeding internal hemorrhoids were found during                            retroflexion. The hemorrhoids were small.                           The exam was otherwise without abnormality. Complications:            No immediate complications. Estimated blood loss:                            Minimal. Estimated Blood Loss:     Estimated blood loss was minimal. Impression:               - Diverticulosis in the entire examined colon.                           - One 3 mm polyp in the rectum, removed with a cold                            biopsy forceps. Resected and retrieved.                           - Non-bleeding internal hemorrhoids.                           - The examination was otherwise  normal. Recommendation:           - Patient has a contact number available for                            emergencies. The signs and symptoms of potential                            delayed complications were discussed with the                            patient. Return to normal activities tomorrow.                            Written discharge instructions were provided to the                            patient.                           - Resume previous  diet.                           - Continue present medications.                           - Resume Plavix tonight                           - Daily fiber supplement such as Benefiber or                            Citrucel                           - Trial of bentyl 10mg  - 1 to 2 tablets PO q 8                            hours PRN cramps                           - Await pathology results.                           - Repeat colonoscopy is recommended for                            surveillance. The colonoscopy date will be                            determined after pathology results from today's                            exam become available for review. Remo Lipps P. Armbruster, MD 06/25/2016 3:41:12 PM This report has been signed electronically.

## 2016-06-25 NOTE — Progress Notes (Signed)
Report to PACU, RN, vss, BBS= Clear.  

## 2016-06-25 NOTE — Patient Instructions (Addendum)
YOU HAD AN ENDOSCOPIC PROCEDURE TODAY AT [facility]:   Refer to the procedure report that was given to you for any specific questions about what was found during the examination.  If the procedure report does not answer your questions, please call your gastroenterologist to clarify.  If you requested that your care partner not be given the details of your procedure findings, then the procedure report has been included in a sealed envelope for you to review at your convenience later.  YOU SHOULD EXPECT: Some feelings of bloating in the abdomen. Passage of more gas than usual.  Walking can help get rid of the air that was put into your GI tract during the procedure and reduce the bloating. If you had a lower endoscopy (such as a colonoscopy or flexible sigmoidoscopy) you may notice spotting of blood in your stool or on the toilet paper. If you underwent a bowel prep for your procedure, you may not have a normal bowel movement for a few days.  Please Note:  You might notice some irritation and congestion in your nose or some drainage.  This is from the oxygen used during your procedure.  There is no need for concern and it should clear up in a day or so.  SYMPTOMS TO REPORT IMMEDIATELY:   Following lower endoscopy (colonoscopy or flexible sigmoidoscopy):  Excessive amounts of blood in the stool  Significant tenderness or worsening of abdominal pains  Swelling of the abdomen that is new, acute  Fever of 100F or higher   Following upper endoscopy (EGD)  Vomiting of blood or coffee ground material  New chest pain or pain under the shoulder blades  Painful or persistently difficult swallowing  New shortness of breath  Fever of 100F or higher  Black, tarry-looking stools  For urgent or emergent issues, a gastroenterologist can be reached at any hour by calling [phone].   DIET:  We do recommend a small meal at first, but then you may proceed to your regular diet.  Drink  plenty of fluids but you should avoid alcoholic beverages for 24 hours.  ACTIVITY:  You should plan to take it easy for the rest of today and you should NOT DRIVE or use heavy machinery until tomorrow (because of the sedation medicines used during the test).    FOLLOW UP: Our staff will call the number listed on your records the next business day following your procedure to check on you and address any questions or concerns that you may have regarding the information given to you following your procedure. If we do not reach you, we will leave a message.  However, if you are feeling well and you are not experiencing any problems, there is no need to return our call.  We will assume that you have returned to your regular daily activities without incident.  If any biopsies were taken you will be contacted by phone or by letter within the next 1-3 weeks.  Please call us at [phone] if you have not heard about the biopsies in 3 weeks.    SIGNATURES/CONFIDENTIALITY: You and/or your care partner have signed paperwork which will be entered into your electronic medical record.  These signatures attest to the fact that that the information above on your After Visit Summary has been reviewed and is understood.  Full responsibility of the confidentiality of this discharge information lies with you and/or your care-partner.  Resume Plavix tonight.  Daily fiber supplement such as Benefiber or  Citrucel.  Try Bentyl 10mg . 1-2 tablets every 8 hours as needed for cramps.  Polyp, diverticulosis and hemorrhoid information given.

## 2016-06-26 ENCOUNTER — Telehealth: Payer: Self-pay | Admitting: *Deleted

## 2016-06-26 ENCOUNTER — Encounter: Payer: Medicare Other | Admitting: Nurse Practitioner

## 2016-06-26 NOTE — Telephone Encounter (Signed)
  Follow up Call-  Call back number 06/25/2016  Post procedure Call Back phone  # [phone]  Permission to leave phone message Yes  Some recent data might be hidden     Patient questions:  Do you have a fever, pain , or abdominal swelling? No. Pain Score  0 *  Have you tolerated food without any problems? Yes.    Have you been able to return to your normal activities? Yes.    Do you have any questions about your discharge instructions: Diet   No. Medications  No. Follow up visit  No.  Do you have questions or concerns about your Care? No.  Actions: * If pain score is 4 or above: No action needed, pain <4.

## 2016-07-01 ENCOUNTER — Encounter: Payer: Self-pay | Admitting: Gastroenterology

## 2016-07-17 ENCOUNTER — Ambulatory Visit (INDEPENDENT_AMBULATORY_CARE_PROVIDER_SITE_OTHER): Payer: Medicare Other | Admitting: Nurse Practitioner

## 2016-07-17 ENCOUNTER — Encounter: Payer: Self-pay | Admitting: Nurse Practitioner

## 2016-07-17 VITALS — BP 129/82 | HR 61 | Temp 96.8°F | Ht 68.0 in | Wt 206.0 lb

## 2016-07-17 DIAGNOSIS — Z6828 Body mass index (BMI) 28.0-28.9, adult: Secondary | ICD-10-CM

## 2016-07-17 DIAGNOSIS — I10 Essential (primary) hypertension: Secondary | ICD-10-CM

## 2016-07-17 DIAGNOSIS — Z125 Encounter for screening for malignant neoplasm of prostate: Secondary | ICD-10-CM

## 2016-07-17 DIAGNOSIS — Z0000 Encounter for general adult medical examination without abnormal findings: Secondary | ICD-10-CM

## 2016-07-17 DIAGNOSIS — K219 Gastro-esophageal reflux disease without esophagitis: Secondary | ICD-10-CM

## 2016-07-17 DIAGNOSIS — G459 Transient cerebral ischemic attack, unspecified: Secondary | ICD-10-CM

## 2016-07-17 MED ORDER — PROPRANOLOL HCL 20 MG PO TABS: 20.0000 mg | ORAL_TABLET | Freq: Two times a day (BID) | ORAL | 12 refills | Status: DC

## 2016-07-17 MED ORDER — CLOPIDOGREL BISULFATE 75 MG PO TABS: 75.0000 mg | ORAL_TABLET | Freq: Every day | ORAL | 1 refills | Status: DC

## 2016-07-17 MED ORDER — OLMESARTAN MEDOXOMIL-HCTZ 20-12.5 MG PO TABS: 1.0000 | ORAL_TABLET | Freq: Every day | ORAL | 5 refills | Status: DC

## 2016-07-17 NOTE — Patient Instructions (Signed)

## 2016-07-17 NOTE — Progress Notes (Signed)
Subjective:    Patient ID: [name], male    DOB: Jun 08, 1946, 70 y.o.   MRN: [id number]  Patient here today for annual physical exam and  follow up of chronic medical problems. When last seen he was c/o vertigo which has since resolved. No complaints today.  Outpatient Encounter Prescriptions as of 07/17/2016  Medication Sig  . clopidogrel (PLAVIX) 75 MG tablet Take 1 tablet (75 mg total) by mouth daily.  . meloxicam (MOBIC) 7.5 MG tablet Take 7.5 mg by mouth daily.  Marland Kitchen olmesartan-hydrochlorothiazide (BENICAR HCT) 20-12.5 MG tablet Take 1 tablet by mouth daily.  . propranolol (INDERAL) 20 MG tablet Take 1 tablet (20 mg total) by mouth 2 (two) times daily.     Hypertension  This is a chronic problem. The current episode started more than 1 year ago. The problem has been waxing and waning since onset. The problem is resistant. Pertinent negatives include no chest pain, headaches, palpitations or shortness of breath. Risk factors for coronary artery disease include dyslipidemia and male gender. Past treatments include angiotensin blockers and diuretics. The current treatment provides moderate improvement. Compliance problems include diet and exercise.  Hypertensive end-organ damage includes CVA. There is no history of CAD/MI.  Hx TIA Greater then 5 years ago- currently on plavix without any bleeding. GERD Currently just doing diet control.    Review of Systems  Constitutional: Negative.   HENT: Negative.   Respiratory: Negative.  Negative for shortness of breath.   Cardiovascular: Negative for chest pain and palpitations.  Genitourinary: Negative.   Neurological: Negative for headaches.  Psychiatric/Behavioral: Negative.   All other systems reviewed and are negative.      Objective:   Physical Exam  Constitutional: He is oriented to person, place, and time. He appears well-developed and well-nourished.  HENT:  Head: Normocephalic.  Right Ear: External ear normal.  Left Ear:  External ear normal.  Nose: Nose normal.  Mouth/Throat: Oropharynx is clear and moist.  Eyes: EOM are normal. Pupils are equal, round, and reactive to light.  Neck: Normal range of motion. Neck supple. No JVD present. No thyromegaly present.  Cardiovascular: Normal rate, regular rhythm, normal heart sounds and intact distal pulses.  Exam reveals no gallop and no friction rub.   No murmur heard. Pulmonary/Chest: Effort normal and breath sounds normal. No respiratory distress. He has no wheezes. He has no rales. He exhibits no tenderness.  Abdominal: Soft. Bowel sounds are normal. He exhibits no mass. There is no tenderness.  Genitourinary:  Genitourinary Comments: Refused prostate rectal exam  Musculoskeletal: Normal range of motion. He exhibits no edema.  Lymphadenopathy:    He has no cervical adenopathy.  Neurological: He is alert and oriented to person, place, and time. No cranial nerve deficit.  Skin: Skin is warm and dry.  Psychiatric: He has a normal mood and affect. His behavior is normal. Judgment and thought content normal.    BP 129/82   Pulse 61   Temp (!) 96.8 F (36 C) (Oral)   Ht '5\' 8"'  (1.727 m)   Wt 206 lb (93.4 kg)   BMI 31.32 kg/m       Assessment & Plan:   1. Essential hypertension Do not add salt to diet - olmesartan-hydrochlorothiazide (BENICAR HCT) 20-12.5 MG tablet; Take 1 tablet by mouth daily.  Dispense: 30 tablet; Refill: 5 - propranolol (INDERAL) 20 MG tablet; Take 1 tablet (20 mg total) by mouth 2 (two) times daily.  Dispense: 60 tablet; Refill: 12 -  CMP14+EGFR - Lipid panel  2. Transient cerebral ischemia, unspecified transient cerebral ischemia type - clopidogrel (PLAVIX) 75 MG tablet; Take 1 tablet (75 mg total) by mouth daily.  Dispense: 90 tablet; Refill: 1  3. Gastroesophageal reflux disease without esophagitis Avoid spicy foods Do not eat 2 hours prior to bedtime   4. BMI 28.0-28.9,adult Discussed diet and exercise for person with BMI  >25 Will recheck weight in 3-6 months  5. Annual physical exam - CBC with Differential/Platelet  6. Prostate cancer screening - PSA, total and free    Labs pending Health maintenance reviewed Diet and exercise encouraged Continue all meds Follow up  In 6 months   [place], FNP

## 2016-07-18 LAB — CMP14+EGFR
ALT: 14 IU/L (ref 0–44)
AST: 15 IU/L (ref 0–40)
Albumin/Globulin Ratio: 2 (ref 1.2–2.2)
Albumin: 4.3 g/dL (ref 3.5–4.8)
Alkaline Phosphatase: 92 IU/L (ref 39–117)
BUN/Creatinine Ratio: 13 (ref 10–24)
BUN: 13 mg/dL (ref 8–27)
Bilirubin Total: 0.3 mg/dL (ref 0.0–1.2)
CALCIUM: 10.2 mg/dL (ref 8.6–10.2)
CO2: 26 mmol/L (ref 18–29)
CREATININE: 0.99 mg/dL (ref 0.76–1.27)
Chloride: 102 mmol/L (ref 96–106)
GFR calc Af Amer: 88 mL/min/{1.73_m2} (ref >59)
GFR, EST NON AFRICAN AMERICAN: 76 mL/min/{1.73_m2} (ref >59)
Globulin, Total: 2.1 g/dL (ref 1.5–4.5)
Glucose: 98 mg/dL (ref 65–99)
Potassium: 4.7 mmol/L (ref 3.5–5.2)
Sodium: 140 mmol/L (ref 134–144)
Total Protein: 6.4 g/dL (ref 6.0–8.5)

## 2016-07-18 LAB — PSA, TOTAL AND FREE
PSA, Free: <0.01 ng/mL
Prostate Specific Ag, Serum: <0.1 ng/mL (ref 0.0–4.0)

## 2016-07-18 LAB — CBC WITH DIFFERENTIAL/PLATELET
BASOS: 1 %
Basophils Absolute: 0.1 10*3/uL (ref 0.0–0.2)
EOS (ABSOLUTE): 0.3 10*3/uL (ref 0.0–0.4)
Eos: 4 %
Hematocrit: 47.7 % (ref 37.5–51.0)
Hemoglobin: 16 g/dL (ref 12.6–17.7)
IMMATURE GRANULOCYTES: 0 %
Immature Grans (Abs): 0 10*3/uL (ref 0.0–0.1)
LYMPHS ABS: 2.2 10*3/uL (ref 0.7–3.1)
Lymphs: 32 %
MCH: 31.4 pg (ref 26.6–33.0)
MCHC: 33.5 g/dL (ref 31.5–35.7)
MCV: 94 fL (ref 79–97)
MONOS ABS: 0.7 10*3/uL (ref 0.1–0.9)
Monocytes: 11 %
NEUTROS ABS: 3.6 10*3/uL (ref 1.4–7.0)
Neutrophils: 52 %
PLATELETS: 222 10*3/uL (ref 150–379)
RBC: 5.09 x10E6/uL (ref 4.14–5.80)
RDW: 14 % (ref 12.3–15.4)
WBC: 6.8 10*3/uL (ref 3.4–10.8)

## 2016-07-18 LAB — LIPID PANEL
CHOL/HDL RATIO: 5.6 ratio — AB (ref 0.0–5.0)
Cholesterol, Total: 212 mg/dL — ABNORMAL HIGH (ref 100–199)
HDL: 38 mg/dL — AB (ref >39)
LDL CALC: 140 mg/dL — AB (ref 0–99)
TRIGLYCERIDES: 170 mg/dL — AB (ref 0–149)
VLDL Cholesterol Cal: 34 mg/dL (ref 5–40)

## 2016-08-25 ENCOUNTER — Other Ambulatory Visit: Payer: Self-pay | Admitting: Pediatrics

## 2016-08-25 DIAGNOSIS — M5442 Lumbago with sciatica, left side: Secondary | ICD-10-CM

## 2016-08-26 NOTE — Telephone Encounter (Signed)
Forwarding to PCP/MMM 

## 2016-10-05 ENCOUNTER — Ambulatory Visit (HOSPITAL_COMMUNITY)
Admission: RE | Admit: 2016-10-05 | Discharge: 2016-10-05 | Disposition: A | Discharge status: Home / Self Care | Payer: Medicare Other | Source: Ambulatory Visit | Attending: Family Medicine | Admitting: Family Medicine

## 2016-10-05 ENCOUNTER — Ambulatory Visit (HOSPITAL_COMMUNITY): Payer: Medicare Other

## 2016-10-05 ENCOUNTER — Ambulatory Visit (INDEPENDENT_AMBULATORY_CARE_PROVIDER_SITE_OTHER): Payer: Medicare Other | Admitting: Family Medicine

## 2016-10-05 ENCOUNTER — Telehealth: Payer: Self-pay | Admitting: Family Medicine

## 2016-10-05 ENCOUNTER — Encounter: Payer: Self-pay | Admitting: Family Medicine

## 2016-10-05 VITALS — BP 137/85 | HR 92 | Temp 99.5°F | Ht 68.0 in | Wt 208.0 lb

## 2016-10-05 DIAGNOSIS — N200 Calculus of kidney: Secondary | ICD-10-CM | POA: Diagnosis not present

## 2016-10-05 DIAGNOSIS — J101 Influenza due to other identified influenza virus with other respiratory manifestations: Secondary | ICD-10-CM | POA: Diagnosis not present

## 2016-10-05 DIAGNOSIS — R1011 Right upper quadrant pain: Secondary | ICD-10-CM | POA: Diagnosis not present

## 2016-10-05 DIAGNOSIS — R109 Unspecified abdominal pain: Secondary | ICD-10-CM | POA: Insufficient documentation

## 2016-10-05 LAB — CMP14+EGFR
A/G RATIO: 1.8 (ref 1.2–2.2)
ALT: 17 IU/L (ref 0–44)
AST: 14 IU/L (ref 0–40)
Albumin: 4.5 g/dL (ref 3.5–4.8)
Alkaline Phosphatase: 105 IU/L (ref 39–117)
BUN/Creatinine Ratio: 20 (ref 10–24)
BUN: 20 mg/dL (ref 8–27)
Bilirubin Total: 0.4 mg/dL (ref 0.0–1.2)
CALCIUM: 10.1 mg/dL (ref 8.6–10.2)
CO2: 24 mmol/L (ref 18–29)
Chloride: 95 mmol/L — ABNORMAL LOW (ref 96–106)
Creatinine, Ser: 0.99 mg/dL (ref 0.76–1.27)
GFR, EST AFRICAN AMERICAN: 88 mL/min/{1.73_m2} (ref >59)
GFR, EST NON AFRICAN AMERICAN: 76 mL/min/{1.73_m2} (ref >59)
GLOBULIN, TOTAL: 2.5 g/dL (ref 1.5–4.5)
Glucose: 104 mg/dL — ABNORMAL HIGH (ref 65–99)
POTASSIUM: 5.1 mmol/L (ref 3.5–5.2)
Sodium: 137 mmol/L (ref 134–144)
TOTAL PROTEIN: 7 g/dL (ref 6.0–8.5)

## 2016-10-05 LAB — LIPASE: LIPASE: 30 U/L (ref 13–78)

## 2016-10-05 LAB — VERITOR FLU A/B WAIVED
Influenza A: NEGATIVE
Influenza B: NEGATIVE

## 2016-10-05 MED ORDER — OSELTAMIVIR PHOSPHATE 75 MG PO CAPS: 75.0000 mg | ORAL_CAPSULE | Freq: Two times a day (BID) | ORAL | 0 refills | Status: DC

## 2016-10-05 NOTE — Telephone Encounter (Signed)
Pt aware of results from earlier. Also informed daughter that he may take Mobic & tylenol for his pain

## 2016-10-05 NOTE — Telephone Encounter (Signed)
I ordered for him to have a CT scan as well

## 2016-10-05 NOTE — Telephone Encounter (Signed)
Daughter aware pt can take his Mobic & Tylenol for his pain

## 2016-10-05 NOTE — Progress Notes (Signed)
BP 137/85   Pulse 92   Temp 99.5 F (37.5 C) (Oral)   Ht 5' 8" (1.727 m)   Wt 208 lb (94.3 kg)   BMI 31.63 kg/m    Subjective:    Patient ID: [name], male    DOB: 05-31-1946, 70 y.o.   MRN: [id number]  HPI: TAINO MAERTENS is a 70 y.o. male presenting on 10/05/2016 for Body aches, headache, stomach pain   HPI Body aches and congestion Patient has been having body aches and congestion then been going on for the past couple days. His wife is been sick for longer than him. His wife was tested positive for influenza A and is likely that that is similar to what he has. He denies any true fevers but has had chills and aches. His cough is productive of yellow-green sputum.  Abdominal pain Patient has been having epigastric and right upper quadrant abdominal pain that's been going on intermittently for the past 6 months but this time is a lot worse than it has been previously. He says that the pain is worse after eating. He has been having some nausea associated with that especially today started up again. He denies any diarrhea or blood in his stool or constipation. He denies any dysuria or urinary symptoms. The pain today as an 8 out of 10.  Relevant past medical, surgical, family and social history reviewed and updated as indicated. Interim medical history since our last visit reviewed. Allergies and medications reviewed and updated.  Review of Systems  Constitutional: Positive for chills. Negative for fever.  HENT: Positive for congestion, postnasal drip, rhinorrhea, sinus pressure and sore throat. Negative for ear discharge, ear pain, sneezing and voice change.   Eyes: Negative for pain, discharge, redness and visual disturbance.  Respiratory: Positive for cough. Negative for shortness of breath and wheezing.   Cardiovascular: Negative for chest pain and leg swelling.  Gastrointestinal: Positive for abdominal pain and nausea. Negative for blood in stool, constipation, diarrhea and  vomiting.  Genitourinary: Negative for dysuria, frequency and hematuria.  Musculoskeletal: Positive for myalgias. Negative for back pain and gait problem.  Skin: Negative for rash.  All other systems reviewed and are negative.  Per HPI unless specifically indicated above    Objective:    BP 137/85   Pulse 92   Temp 99.5 F (37.5 C) (Oral)   Ht 5' 8" (1.727 m)   Wt 208 lb (94.3 kg)   BMI 31.63 kg/m   Wt Readings from Last 3 Encounters:  10/05/16 208 lb (94.3 kg)  07/17/16 206 lb (93.4 kg)  06/25/16 210 lb (95.3 kg)    Physical Exam  Constitutional: He is oriented to person, place, and time. He appears well-developed and well-nourished. No distress.  HENT:  Right Ear: Tympanic membrane, external ear and ear canal normal.  Left Ear: Tympanic membrane, external ear and ear canal normal.  Nose: Mucosal edema and rhinorrhea present. No sinus tenderness. No epistaxis. Right sinus exhibits maxillary sinus tenderness. Right sinus exhibits no frontal sinus tenderness. Left sinus exhibits maxillary sinus tenderness. Left sinus exhibits no frontal sinus tenderness.  Mouth/Throat: Uvula is midline and mucous membranes are normal. Posterior oropharyngeal edema and posterior oropharyngeal erythema present. No oropharyngeal exudate or tonsillar abscesses.  Eyes: Conjunctivae are normal. Right eye exhibits no discharge. Left eye exhibits no discharge. No scleral icterus.  Neck: Neck supple. No thyromegaly present.  Cardiovascular: Normal rate, regular rhythm, normal heart sounds and intact distal pulses.  No murmur heard. Pulmonary/Chest: Effort normal and breath sounds normal. No respiratory distress. He has no wheezes. He has no rales.  Abdominal: Soft. Bowel sounds are normal. He exhibits no distension. There is no hepatosplenomegaly. There is tenderness in the right upper quadrant. There is no rigidity, no rebound, no guarding and no CVA tenderness.  Musculoskeletal: Normal range of motion.  He exhibits no edema.  Lymphadenopathy:    He has no cervical adenopathy.  Neurological: He is alert and oriented to person, place, and time. Coordination normal.  Skin: Skin is warm and dry. No rash noted. He is not diaphoretic.  Psychiatric: He has a normal mood and affect. His behavior is normal.  Nursing note and vitals reviewed.     Assessment & Plan:   Problem List Items Addressed This Visit    None    Visit Diagnoses    Influenza A    -  Primary   Relevant Medications   oseltamivir (TAMIFLU) 75 MG capsule   Other Relevant Orders   Veritor Flu A/B Waived   Abdominal pain, right upper quadrant       Acute onset on recurrent symptoms, will do ultrasound stat   Relevant Orders   US Abdomen Limited RUQ   Lipase   CMP14+EGFR      Follow up plan: Return if symptoms worsen or fail to improve.  Counseling provided for all of the vaccine components Orders Placed This Encounter  Procedures  . Veritor Flu A/B Waived  . US Abdomen Limited RUQ  . Lipase  . [place], MD [facility] 10/05/2016, 11:16 AM

## 2016-10-06 ENCOUNTER — Ambulatory Visit (HOSPITAL_COMMUNITY): Payer: Medicare Other

## 2016-10-14 ENCOUNTER — Encounter (HOSPITAL_COMMUNITY): Payer: Self-pay

## 2016-10-14 ENCOUNTER — Ambulatory Visit (HOSPITAL_COMMUNITY)
Admission: RE | Admit: 2016-10-14 | Discharge: 2016-10-14 | Disposition: A | Discharge status: Home / Self Care | Payer: Medicare Other | Source: Ambulatory Visit | Attending: Family Medicine | Admitting: Family Medicine

## 2016-10-14 DIAGNOSIS — N200 Calculus of kidney: Secondary | ICD-10-CM | POA: Diagnosis not present

## 2016-10-14 DIAGNOSIS — I700 Atherosclerosis of aorta: Secondary | ICD-10-CM | POA: Insufficient documentation

## 2016-10-14 DIAGNOSIS — R918 Other nonspecific abnormal finding of lung field: Secondary | ICD-10-CM | POA: Diagnosis not present

## 2016-10-14 DIAGNOSIS — R109 Unspecified abdominal pain: Secondary | ICD-10-CM | POA: Insufficient documentation

## 2016-10-16 ENCOUNTER — Telehealth: Payer: Self-pay | Admitting: Nurse Practitioner

## 2016-10-16 NOTE — Telephone Encounter (Signed)
If he is having continued abdominal pain have him come in sooner but if he is talking about for his routine follow-up for hypertension then he is due to come back in 3 months

## 2016-10-16 NOTE — Telephone Encounter (Signed)
Patient aware and appointment made

## 2016-10-16 NOTE — Telephone Encounter (Signed)
lmtcb

## 2016-10-16 NOTE — Telephone Encounter (Signed)
Patient aware of renal stone study results and would like to know when you would like him to follow up with you? Patient does not have a follow up appointment. Please advise and send to pools.

## 2016-10-21 ENCOUNTER — Encounter: Payer: Self-pay | Admitting: Family Medicine

## 2016-10-21 ENCOUNTER — Ambulatory Visit (INDEPENDENT_AMBULATORY_CARE_PROVIDER_SITE_OTHER): Payer: Medicare Other | Admitting: Family Medicine

## 2016-10-21 VITALS — BP 116/79 | HR 77 | Temp 97.1°F | Ht 68.0 in | Wt 212.0 lb

## 2016-10-21 DIAGNOSIS — R1011 Right upper quadrant pain: Secondary | ICD-10-CM

## 2016-10-21 MED ORDER — GI COCKTAIL ~~LOC~~
30.0000 mL | Freq: Once | ORAL | Status: DC
Start: 2016-10-21 — End: 2017-05-27

## 2016-10-21 MED ORDER — PANTOPRAZOLE SODIUM 40 MG PO TBEC: 40.0000 mg | DELAYED_RELEASE_TABLET | Freq: Every day | ORAL | 3 refills | Status: DC

## 2016-10-21 NOTE — Progress Notes (Signed)
BP 116/79   Pulse 77   Temp 97.1 F (36.2 C) (Oral)   Ht _0  (1.727 m)   Wt 212 lb (96.2 kg)   BMI 32.23 kg/m    Subjective:    Patient ID: Timothy Duran, male    DOB: February 03, 1946, 71 y.o.   MRN: [id number]  HPI: Timothy Duran is a 71 y.o. male presenting on 10/21/2016 for Abdominal Pain   HPI Right upper quadrant abdominal pain Patient has been having continued intermittent epigastric abdominal pain. He says that it is most every day but does come and go, over the last few days and has been lasting most of the day. He does seem to associate it with being after meals but does not associate it with any specific type of food. He does have nausea but no vomiting. He does have some belching but denies any heartburn. He denies any pain going into his back or into his flanks. The pain does go into the right side of his abdomen as well. He denies any constipation or diarrhea. He does see his stools and then a little bit looser over the past day and a half, he denies any blood in his stool. He denies any urinary issues such as dysuria or hematuria.  Relevant past medical, surgical, family and social history reviewed and updated as indicated. Interim medical history since our last visit reviewed. Allergies and medications reviewed and updated.  Review of Systems  Constitutional: Negative for chills and fever.  Eyes: Negative for discharge.  Respiratory: Negative for shortness of breath and wheezing.   Cardiovascular: Negative for chest pain and leg swelling.  Gastrointestinal: Positive for abdominal pain and nausea. Negative for blood in stool, constipation, diarrhea and vomiting.  Endocrine: Negative for cold intolerance and heat intolerance.  Genitourinary: Negative for difficulty urinating, flank pain, frequency, hematuria and urgency.  Musculoskeletal: Negative for back pain and gait problem.  Skin: Negative for color change and rash.  All other systems reviewed and are negative.   Per  HPI unless specifically indicated above      Objective:    BP 116/79   Pulse 77   Temp 97.1 F (36.2 C) (Oral)   Ht _1  (1.727 m)   Wt 212 lb (96.2 kg)   BMI 32.23 kg/m   Wt Readings from Last 3 Encounters:  10/21/16 212 lb (96.2 kg)  10/05/16 208 lb (94.3 kg)  07/17/16 206 lb (93.4 kg)    Physical Exam  Constitutional: He is oriented to person, place, and time. He appears well-developed and well-nourished. No distress.  Eyes: Conjunctivae are normal. Right eye exhibits no discharge. Left eye exhibits no discharge. No scleral icterus.  Cardiovascular: Normal rate, regular rhythm, normal heart sounds and intact distal pulses.   No murmur heard. Pulmonary/Chest: Effort normal and breath sounds normal. No respiratory distress. He has no wheezes. He has no rales.  Abdominal: Soft. Bowel sounds are normal. He exhibits no distension. There is no hepatosplenomegaly. There is tenderness in the right upper quadrant and epigastric area. There is no rigidity, no rebound, no guarding and no CVA tenderness.  Musculoskeletal: Normal range of motion. He exhibits no edema.  Neurological: He is alert and oriented to person, place, and time. Coordination normal.  Skin: Skin is warm and dry. No rash noted. He is not diaphoretic.  Psychiatric: He has a normal mood and affect. His behavior is normal.  Nursing note and vitals reviewed.   Results for orders placed  or performed in visit on 10/05/16  Veritor Flu A/B Waived  Result Value Ref Range   Influenza A Negative Negative   Influenza B Negative Negative  Lipase  Result Value Ref Range   Lipase 30 13 - 78 U/L  CMP14+EGFR  Result Value Ref Range   Glucose 104 (H) 65 - 99 mg/dL   BUN 20 8 - 27 mg/dL   Creatinine, Ser 0.99 0.76 - 1.27 mg/dL   GFR calc non Af Amer 76 >59 mL/min/1.73   GFR calc Af Amer 88 >59 mL/min/1.73   BUN/Creatinine Ratio 20 10 - 24   Sodium 137 134 - 144 mmol/L   Potassium 5.1 3.5 - 5.2 mmol/L   Chloride 95 (L) 96 -  106 mmol/L   CO2 24 18 - 29 mmol/L   Calcium 10.1 8.6 - 10.2 mg/dL   Total Protein 7.0 6.0 - 8.5 g/dL   Albumin 4.5 3.5 - 4.8 g/dL   Globulin, Total 2.5 1.5 - 4.5 g/dL   Albumin/Globulin Ratio 1.8 1.2 - 2.2   Bilirubin Total 0.4 0.0 - 1.2 mg/dL   Alkaline Phosphatase 105 39 - 117 IU/L   AST 14 0 - 40 IU/L   ALT 17 0 - 44 IU/L   Right upper quadrant abdominal ultrasound: Negative  CT abdomen/renal study: Renal pelvic stones, early signs of avascular necrosis in the right hip, stable 7 mm nodule and right lower lung, unchanged since 2010, moderate abdominal aortic atherosclerosis    Assessment & Plan:   Problem List Items Addressed This Visit    None    Visit Diagnoses    Abdominal pain, right upper quadrant    -  Primary   Concern for ulcer versus cholecystitis, given GI cocktail and will do HIDA scan   Relevant Medications   gi cocktail (Maalox,Lidocaine,Donnatal)   pantoprazole (PROTONIX) 40 MG tablet   Other Relevant Orders   NM Hepato W/Eject Fract       Follow up plan: Return if symptoms worsen or fail to improve.  Counseling provided for all of the vaccine components Orders Placed This Encounter  Procedures  . NM Hepato W/Eject Fract    Caryl Pina, MD [facility] 10/21/2016, 8:52 AM

## 2016-11-02 ENCOUNTER — Telehealth: Payer: Self-pay | Admitting: Nurse Practitioner

## 2016-11-02 NOTE — Telephone Encounter (Signed)
Pt instructed to take medications

## 2016-11-04 ENCOUNTER — Ambulatory Visit (HOSPITAL_COMMUNITY)
Admission: RE | Admit: 2016-11-04 | Discharge: 2016-11-04 | Disposition: A | Discharge status: Home / Self Care | Payer: Medicare Other | Source: Ambulatory Visit | Attending: Family Medicine | Admitting: Family Medicine

## 2016-11-04 DIAGNOSIS — R1011 Right upper quadrant pain: Secondary | ICD-10-CM | POA: Insufficient documentation

## 2016-11-04 MED ORDER — TECHNETIUM TC 99M MEBROFENIN IV KIT
5.0000 | PACK | Freq: Once | INTRAVENOUS | Status: AC | PRN
  Administered 2016-11-04: 5 via INTRAVENOUS

## 2016-11-09 ENCOUNTER — Other Ambulatory Visit: Payer: Self-pay | Admitting: Nurse Practitioner

## 2016-11-09 DIAGNOSIS — G459 Transient cerebral ischemic attack, unspecified: Secondary | ICD-10-CM

## 2016-11-18 ENCOUNTER — Ambulatory Visit (INDEPENDENT_AMBULATORY_CARE_PROVIDER_SITE_OTHER): Payer: Medicare Other | Admitting: Diagnostic Neuroimaging

## 2016-11-18 ENCOUNTER — Encounter: Payer: Self-pay | Admitting: Diagnostic Neuroimaging

## 2016-11-18 VITALS — BP 137/79 | HR 85 | Wt 211.8 lb

## 2016-11-18 DIAGNOSIS — R42 Dizziness and giddiness: Secondary | ICD-10-CM

## 2016-11-18 DIAGNOSIS — G43809 Other migraine, not intractable, without status migrainosus: Secondary | ICD-10-CM

## 2016-11-18 DIAGNOSIS — G43109 Migraine with aura, not intractable, without status migrainosus: Secondary | ICD-10-CM | POA: Diagnosis not present

## 2016-11-18 NOTE — Progress Notes (Signed)
[facility]  PATIENT: Timothy Duran DOB: 11-20-1945  REFERRING CLINICIAN: Mary-Margaret Hassell Done, FNP HISTORY FROM: patient and daughter  REASON FOR VISIT: follow up   HISTORICAL  CHIEF COMPLAINT:  Chief Complaint  Patient presents with  . Dizziness    rm 7, dgtr- [name], " I'm dizzy every day; stopped propanolol 4 days ago but no change; less dizzy from past but get lightheaded"  . Follow-up    6 months    HISTORY OF PRESENT ILLNESS:   UPDATE 11/18/16: Since last visit, doing about the same. Stopped propranolol a few days ago, to see if lightheadedness would improve. No change so far. No major vertigo / dizzy attacks.   UPDATE 05/18/16: Since last visit, doing much better. Dizzy attacks essentially resolved. Tolerating propranolol well. BP reading range SBP 100-140 ever last few weeks. AM BP tends to be lower.   NEW HPI (04/06/16): 71 year old right-handed male with history of hypercholesterolemia, prostate cancer, kidney stones, lung tumor, TIA, here for evaluation of dizziness spells. In 2012 patient came to see me for similar problems. At that time he was having 2-3 episodes per year. Symptoms had resolved. However in the last 4-6 weeks symptoms have returned with more intensity. Patient reports a seasickness sensation, rocking back and forth, nausea, vomiting. No spinning sensation, numbness or weakness. Sometimes she has occasional headache in the occipital region with aching pressure sensation, nausea. No photophobia or phonophobia. Patient is having episodes on a daily basis. Attacks can last 15-20 minutes at a time. Sometimes changing position seems to trigger the episodes. Patient has family history of migraine in his daughter.  UPDATE (05/25/11): Doing much better; no further dizzy spells.  PRIOR HPI (04/13/11): 71 year old right-handed male with history of hypertension, hypercholesterolemia, prostate cancer, benign lung nodule, here for evaluation of  intermittent episodes of dizziness. In 2009 he had a 30 minute episodes of feeling sweaty, dizziness, inability to walk straight. He was diagnosed with TIA and started on aspirin and plavix. Since Jan 2012, he has had 3 episodes lasting 5 minutes each, of severe nausea, vomiting, dizziness, "head spinning" sensation.  These were not triggered by change in head position or orthostatic changes.  He is not exerting himself from these occurred. He had no chest pain, shortness of breath with these events.  He did feel a wave of heat and numbness from his toes up to his ears during these episodes.    REVIEW OF SYSTEMS: Full 14 system review of systems performed and negative with exception of: eye itching abd pain.   ALLERGIES: Allergies  Allergen Reactions  . Other     Nasal packing- headaches and facial pain  . Penicillins     Blisters  Has patient had a PCN reaction causing immediate rash, facial/tongue/throat swelling, SOB or lightheadedness with hypotension: (Unknown} Has patient had a PCN reaction causing severe rash involving mucus membranes or skin necrosis: Unknown Has patient had a PCN reaction that required hospitalization No Has patient had a PCN reaction occurring within the last 10 years: No If all of the above answers are "NO", then may proceed with Cephalosporin use.    HOME MEDICATIONS: Outpatient Medications Prior to Visit  Medication Sig Dispense Refill  . clopidogrel (PLAVIX) 75 MG tablet Take 1 Tablet by mouth once daily 90 tablet 1  . meloxicam (MOBIC) 7.5 MG tablet TAKE ONE TABLET BY MOUTH ONCE DAILY 30 tablet 0  . olmesartan-hydrochlorothiazide (BENICAR HCT) 20-12.5 MG tablet Take 1 tablet by mouth daily. [place]  tablet 5  . pantoprazole (PROTONIX) 40 MG tablet Take 1 tablet (40 mg total) by mouth daily. 30 tablet 3  . propranolol (INDERAL) 20 MG tablet Take 1 tablet (20 mg total) by mouth 2 (two) times daily. 60 tablet 12   Facility-Administered Medications Prior to Visit    Medication Dose Route Frequency Provider Last Rate Last Dose  . 0.9 %  sodium chloride infusion  500 mL Intravenous Continuous Manus Gunning, MD      . gi cocktail (Maalox,Lidocaine,Donnatal)  30 mL Oral Once Worthy Rancher, MD        PAST MEDICAL HISTORY: Past Medical History:  Diagnosis Date  . Abdominal hernia    "in there now" (05/20/2015)  . Arthritis    "terrible; all over" (05/20/2015)  . Basal cell carcinoma    "left neck"  . Diverticulosis   . Epistaxis 05/20/2015   hospitalized  . Hypercholesterolemia   . Hypertension   . Kidney stone   . Prostate cancer [facility]) 2010   prostatectomy  . TIA (transient ischemic attack) 2005   ?    PAST SURGICAL HISTORY: Past Surgical History:  Procedure Laterality Date  . BASAL CELL CARCINOMA EXCISION Left 2015   "neck"  . BLADDER STONE REMOVAL  09/2009  . [place]   "got one stone; still has one lodge in there" (05/20/2015)  . INCISION AND DRAINAGE ABSCESS Left 06/20/2015   Procedure: INCISION AND DRAINAGE ABSCESS;  Surgeon: Melina Schools, MD;  Location: [facility];  Service: Orthopedics;  Laterality: Left;  . [place]  . KNEE ARTHROTOMY Left 07/20/2015   Procedure: KNEE ARTHROTOMY Incision and Drainage left knee;  Surgeon: Paralee Cancel, MD;  Location: [facility];  Service: Orthopedics;  Laterality: Left;  . NASAL ENDOSCOPY WITH EPISTAXIS CONTROL Left 05/21/2015   Procedure: NASAL ENDOSCOPY WITH EPISTAXIS CONTROL;  Surgeon: Melissa Montane, MD;  Location: [facility];  Service: ENT;  Laterality: Left;  . PROSTATECTOMY  01/2010  . SHOULDER SURGERY  11/2015   torn ligament  . VIDEO ASSISTED THORACOSCOPY (VATS)/WEDGE RESECTION Right 10/2009   "upper; benign"    FAMILY HISTORY: Family History  Problem Relation Age of Onset  . Dementia Mother   . Prostate cancer Father     eye, lung, prostate  . Prostate cancer Brother   . Colon cancer Sister     SOCIAL HISTORY:  Social History   Social  History  . Marital status: Married    Spouse name: Bethena Roys  . Number of children: 2  . Years of education: 12   Occupational History  . retired     part time- Chief Financial Officer for [employer]   Social History Main Topics  . Smoking status: Former Smoker    Packs/day: 1.00    Years: 4.00    Types: Cigarettes    Quit date: 08/29/2008  . Smokeless tobacco: Never Used  . Alcohol use No  . Drug use: No  . Sexual activity: Not on file   Other Topics Concern  . Not on file   Social History Narrative   Lives with wife   'caffeine use- coffee, 2 cups a day, occas tea     PHYSICAL EXAM  GENERAL EXAM/CONSTITUTIONAL: Vitals:  Vitals:   11/18/16 1017  BP: 137/79  Pulse: 85  Weight: 211 lb 12.8 oz (96.1 kg)    ORTHOSTATIC VITALS  04/06/16    LYING   137/91, 78   STANDING  137/83, 83  Body mass index is 32.2 kg/m. No exam data present  Patient is in no distress; well developed, nourished and groomed; neck is supple  CARDIOVASCULAR:  Examination of carotid arteries is normal; no carotid bruits  Regular rate and rhythm, no murmurs  Examination of peripheral vascular system by observation and palpation is normal  EYES:  Ophthalmoscopic exam of optic discs and posterior segments is normal; no papilledema or hemorrhages  MUSCULOSKELETAL:  Gait, strength, tone, movements noted in Neurologic exam below  NEUROLOGIC: MENTAL STATUS:  No flowsheet data found.  awake, alert, oriented to person, place and time  recent and remote memory intact  normal attention and concentration  language fluent, comprehension intact, naming intact,   fund of knowledge appropriate  CRANIAL NERVE:   2nd - no papilledema on fundoscopic exam  2nd, 3rd, 4th, 6th - pupils equal and reactive to light, visual fields full to confrontation, extraocular muscles intact, no nystagmus  5th - facial sensation symmetric  7th - facial strength symmetric  8th - hearing intact  9th - palate  elevates symmetrically, uvula midline  11th - shoulder shrug symmetric  12th - tongue protrusion midline  MOTOR:   normal bulk and tone, full strength in the BUE, BLE  SENSORY:   normal and symmetric to light touch  COORDINATION:   finger-nose-finger, fine finger movements normal  REFLEXES:   deep tendon reflexes TRACE and symmetric  GAIT/STATION:   narrow based gait; romberg is negative    DIAGNOSTIC DATA (LABS, IMAGING, TESTING) - I reviewed patient records, labs, notes, testing and imaging myself where available.  Lab Results  Component Value Date   WBC 6.8 07/17/2016   HGB 14.3 07/21/2015   HCT 47.7 07/17/2016   MCV 94 07/17/2016   PLT 222 07/17/2016      Component Value Date/Time   NA 137 10/05/2016 1128   K 5.1 10/05/2016 1128   CL 95 (L) 10/05/2016 1128   CO2 24 10/05/2016 1128   GLUCOSE 104 (H) 10/05/2016 1128   GLUCOSE 120 (H) 07/21/2015 0516   BUN 20 10/05/2016 1128   CREATININE 0.99 10/05/2016 1128   CALCIUM 10.1 10/05/2016 1128   PROT 7.0 10/05/2016 1128   ALBUMIN 4.5 10/05/2016 1128   AST 14 10/05/2016 1128   ALT 17 10/05/2016 1128   ALKPHOS 105 10/05/2016 1128   BILITOT 0.4 10/05/2016 1128   GFRNONAA 76 10/05/2016 1128   GFRAA 88 10/05/2016 1128   Lab Results  Component Value Date   CHOL 212 (H) 07/17/2016   HDL 38 (L) 07/17/2016   LDLCALC 140 (H) 07/17/2016   TRIG 170 (H) 07/17/2016   CHOLHDL 5.6 (H) 07/17/2016   No results found for: HGBA1C No results found for: VITAMINB12 No results found for: TSH   04/29/11 MRI brain (without contrast)  - chronic right basal ganglia infarction with hemosiderin staining.  04/29/11 MRA head  - normal  04/29/11 MRA neck  - normal  05/12/16 MRI brain and IAC [I reviewed images myself and agree with interpretation. -[facility]P]  1.   Mild age related cortical atrophy and mild chronic microvascular ischemic change.   None of the changes appear to be acute. The atrophy is slightly increased compared to  04/30/2011 and the microvascular ischemic changes are similar. 2.   Mild dolichoectasia of the left vertebral and basilar arteries. 3.   The internal auditory canals appear normal before and after contrast administration. 4.    There are no acute findings.     ASSESSMENT AND  PLAN  71 y.o. year old male here with intermittent dizzy episodes with C6 sensation, nausea, vomiting, headaches. Could represent peripheral vestibulopathy, vestibular migraine, other secondary cause. Some benefit with propranolol in past.    Dx: peripheral vestibulopathy + vestibular migraine + lightheadedness (orthostatic hypotension)  1. Dizziness and giddiness   2. Vestibular migraine      PLAN: I spent 25 minutes of face to face time with patient. Greater than 50% of time was spent in counseling and coordination of care with patient. In summary we discussed:  - ok to stay off propranolol for now - encouraged physical activity, but using caution with balance - may consider physical therapy - monitor symptoms; return as needed  Return if symptoms worsen or fail to improve, for return to PCP.    Penni Bombard, MD [id number], [id number] AM Certified in Neurology, Neurophysiology and Neuroimaging  [facility] [address] [place], [place] [address] [phone]

## 2017-03-18 ENCOUNTER — Other Ambulatory Visit: Payer: Self-pay | Admitting: Nurse Practitioner

## 2017-03-18 DIAGNOSIS — I10 Essential (primary) hypertension: Secondary | ICD-10-CM

## 2017-05-11 ENCOUNTER — Other Ambulatory Visit: Payer: Self-pay | Admitting: Nurse Practitioner

## 2017-05-11 DIAGNOSIS — G459 Transient cerebral ischemic attack, unspecified: Secondary | ICD-10-CM

## 2017-05-13 NOTE — Telephone Encounter (Signed)
Appt made for 06/04/17

## 2017-05-13 NOTE — Telephone Encounter (Signed)
Last refill without being seen 

## 2017-05-16 IMAGING — CT CT ANGIO HEAD
6 of 19 series · 17 of 47 positions shown · IV contrast (OMNI 350)
Comparison: CT head August 12, 2011 and MRI of the brain April 30, 2011

CLINICAL DATA: Tumor versus aneurysm. Headache and epistaxis.
History of hypertension, transient ischemic attack, prostate cancer.

EXAM:
CT HEAD WITHOUT CONTRAST
CT ANGIOGRAPHY OF THE HEAD
TECHNIQUE: Contiguous axial images were obtained from the base of the skull
through the vertex without intravenous contrast. Multidetector CT
imaging of the head was performed using the standard protocol during
bolus administration of intravenous contrast. Multiplanar CT image
reconstructions and MIPs were obtained to evaluate the vascular
anatomy. Please note, due to the nondiagnostic initial angiogram,
repeat examination performed.
CONTRAST:  100 cc OMNIPAQUE IOHEXOL 350 MG/ML SOLN

[Series 8: headangio 2.0 j40f 1 · axial · 0.47mm/px · z∈[-180,-126]mm · 2 of 83 slices shown (1 of 2)]
[im 28/83  brain]
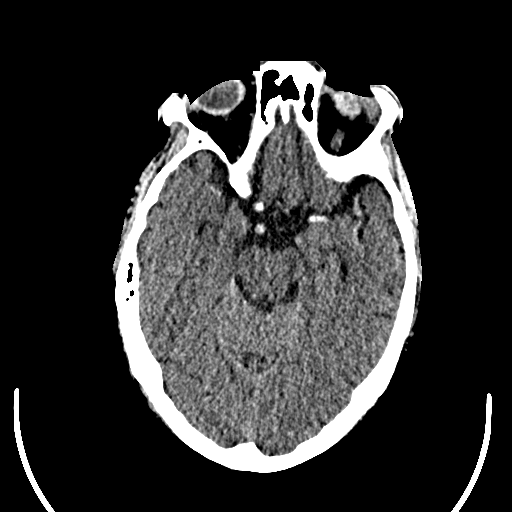
[im 55/83  brain]
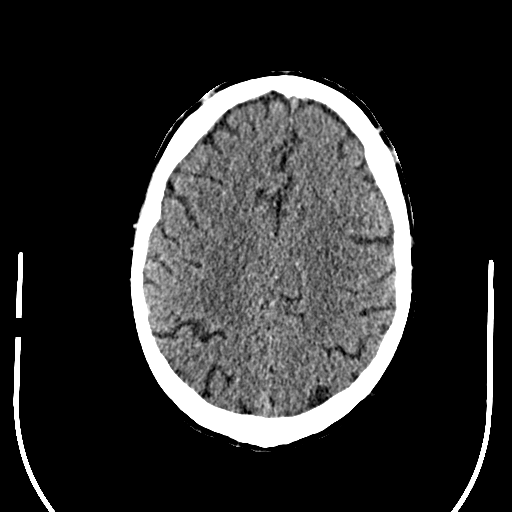

[Series 9: axial 1x1 · axial · 0.39mm/px · z∈[-204,-99]mm · 5 of 159 slices shown (1 of 2)]
[im 27/159  brain]
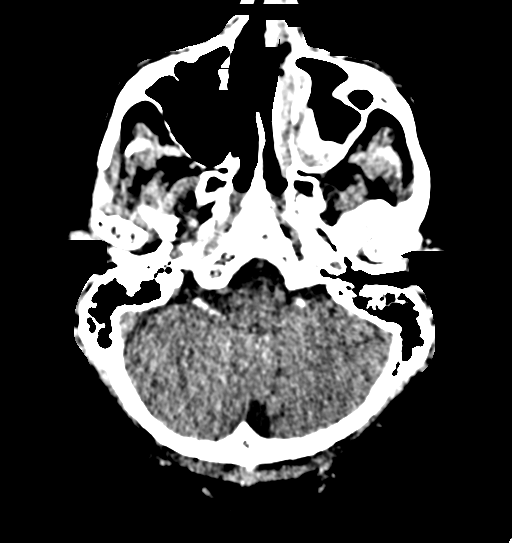
[im 53/159  bone]
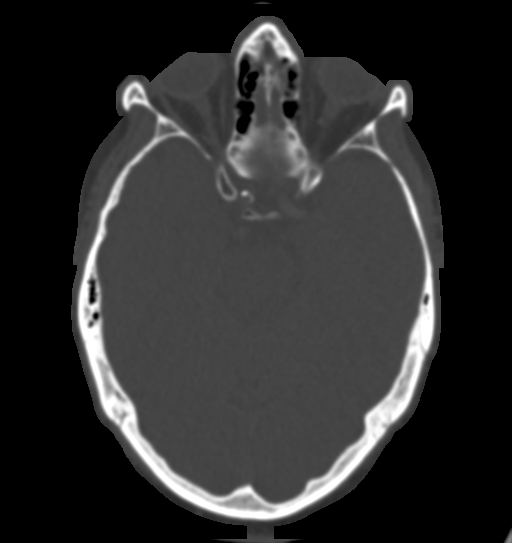
[im 80/159  brain]
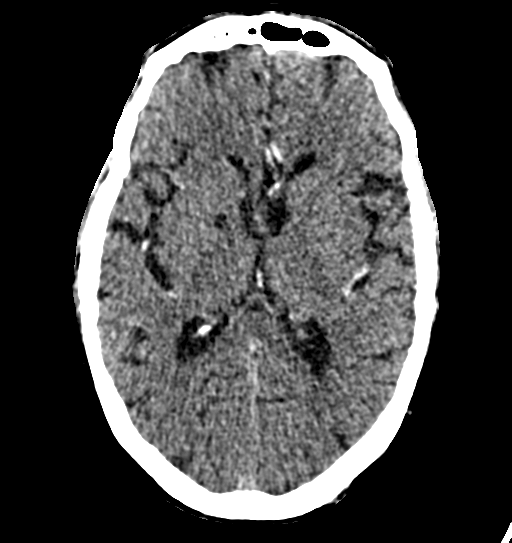
[im 106/159  bone]
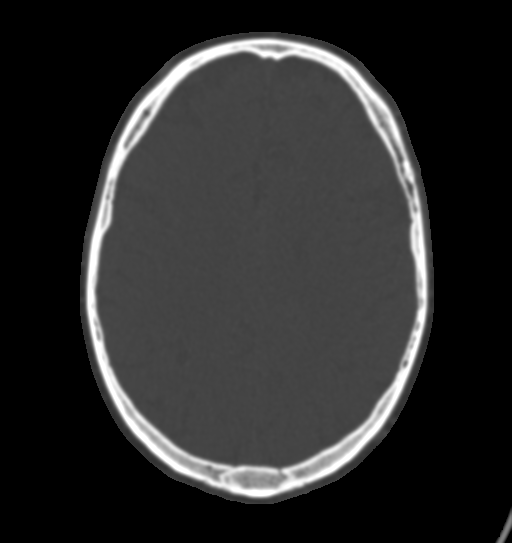
[im 132/159  brain]
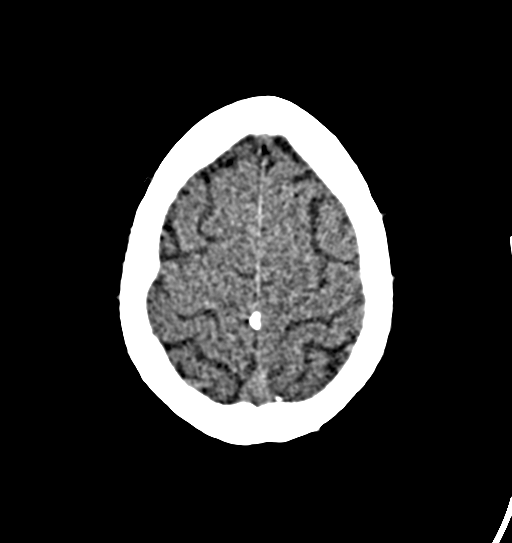

[Series 10: coronal 1x1 · coronal · 0.32mm/px · 2 of 222 slices shown]
[im 74/222  brain]
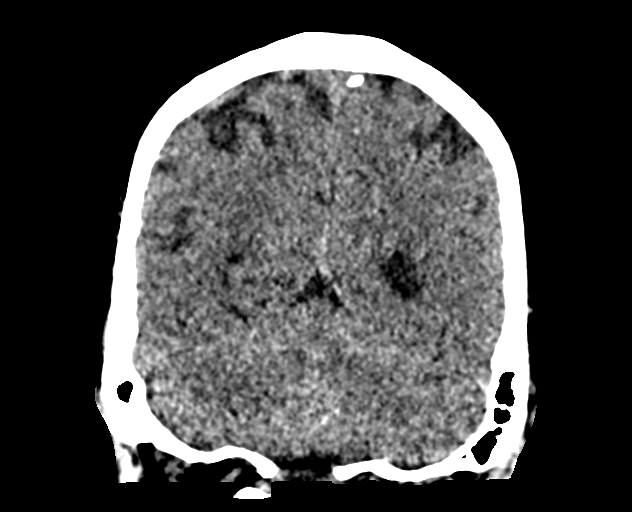
[im 148/222  brain]
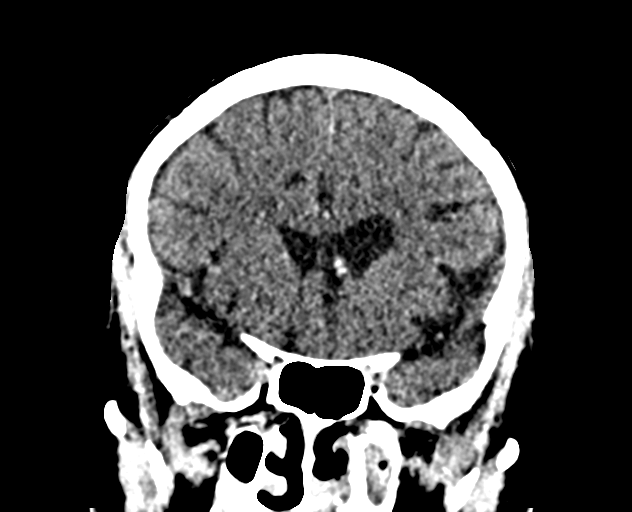

[Series 23: headangio 2.0 j40f 1 · axial · 0.47mm/px · z∈[-65,-11]mm · 2 of 82 slices shown (2 of 2)]
[im 28/82  brain]
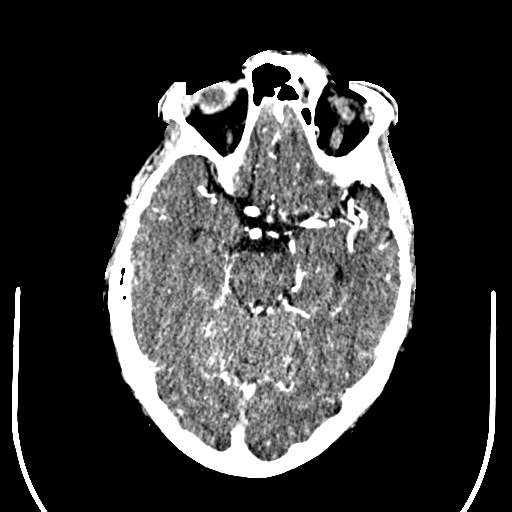
[im 55/82  brain]
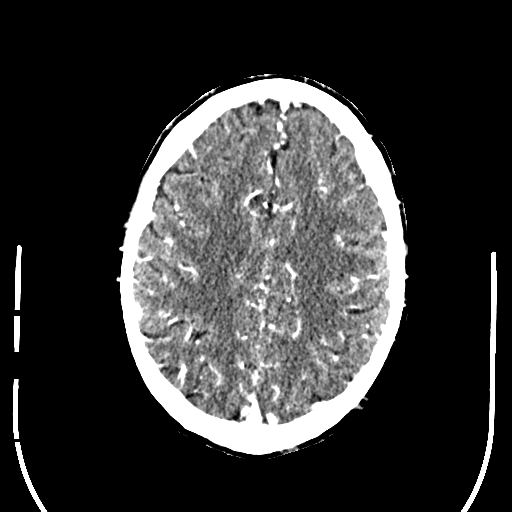

[Series 24: axial 1x1 · axial · 0.39mm/px · z∈[-89,+14]mm · 5 of 155 slices shown (2 of 2)]
[im 26/155  brain]
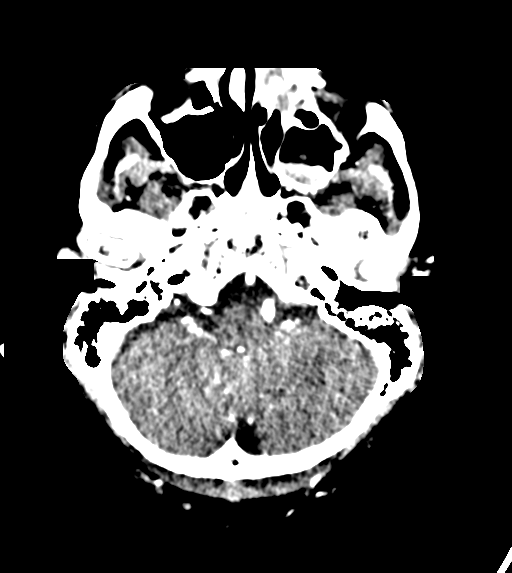
[im 52/155  brain]
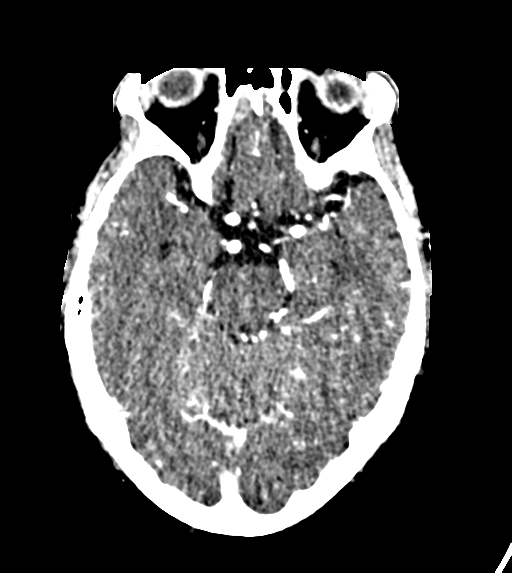
[im 78/155  brain]
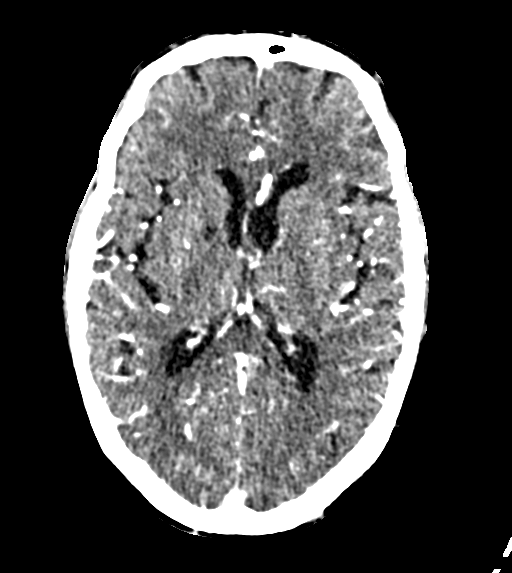
[im 103/155  brain]
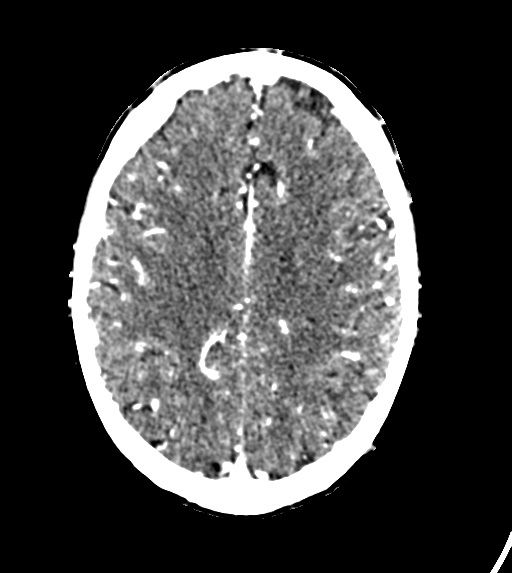
[im 129/155  brain]
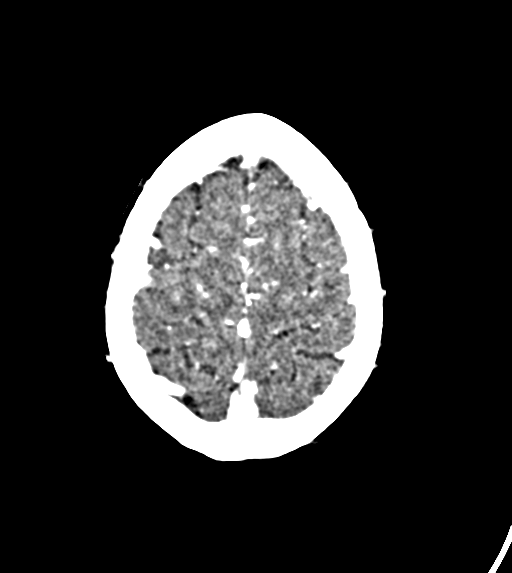

[Series 28: sagittal 1x1 · sagittal · 0.33mm/px · 1 of 157 slices shown]
[im 79/157  brain]
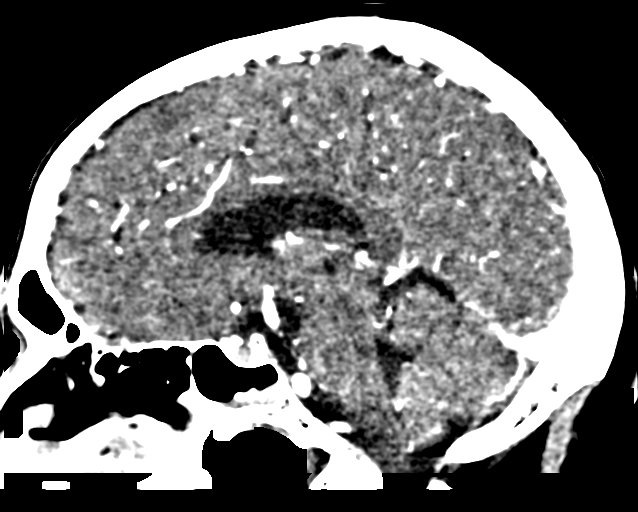

[17 of 47 positions shown; findings below may reference images not displayed]

FINDINGS: CT HEAD WITHOUT CONTRAST

The ventricles and sulci are normal for age. No intraparenchymal
hemorrhage, mass effect nor midline shift. Patchy supratentorial
white matter hypodensities are less than expected for patient's age
and though non-specific suggest sequelae of chronic small vessel
ischemic disease. Subcentimeter hypodensity RIGHT internal capsule
corresponds to perivascular space on prior MRI. No acute large
vascular territory infarcts.

No abnormal extra-axial fluid collections. Basal cisterns are
patent. Mild to moderate calcific atherosclerosis of the carotid
siphons and included vertebral arteries.

No skull fracture. The included ocular globes and orbital contents
are non-suspicious. Lobulated mucosal thickening of the included
maxillary sinuses, ethmoid and sphenoid sinuses packing material
LEFT nasal vestibule. The mastoid air cells are well aerated.

CT ANGIOGRAPHY OF THE HEAD

Anterior circulation: Normal appearance of the cervical internal
carotid arteries, petrous, cavernous and supra clinoid internal
carotid arteries. Widely patent anterior communicating artery.
Normal appearance of the anterior and middle cerebral arteries.

Posterior circulation: LEFT vertebral artery is dominant with normal
appearance of the vertebral arteries, vertebrobasilar junction and
basilar artery, as well as main branch vessels. Minimal luminal
irregularity RIGHT P1 segment compatible with atherosclerosis.
Posterior cerebral arteries are widely patent.

No large vessel occlusion, hemodynamically significant stenosis,
dissection, luminal irregularity, contrast extravasation or aneurysm
within the anterior nor posterior circulation.
IMPRESSION: CT head: No acute intracranial process; normal noncontrast CT head
for age.

CTA head: No aneurysm, acute vascular process or high-grade
stenosis.

## 2017-05-27 ENCOUNTER — Ambulatory Visit (INDEPENDENT_AMBULATORY_CARE_PROVIDER_SITE_OTHER): Payer: Medicare Other | Admitting: Pharmacist

## 2017-05-27 ENCOUNTER — Encounter: Payer: Self-pay | Admitting: Pharmacist

## 2017-05-27 VITALS — BP 116/72 | HR 68 | Ht 68.0 in | Wt 217.0 lb

## 2017-05-27 DIAGNOSIS — E6609 Other obesity due to excess calories: Secondary | ICD-10-CM | POA: Diagnosis not present

## 2017-05-27 DIAGNOSIS — R42 Dizziness and giddiness: Secondary | ICD-10-CM | POA: Diagnosis not present

## 2017-05-27 DIAGNOSIS — Z6832 Body mass index (BMI) 32.0-32.9, adult: Secondary | ICD-10-CM | POA: Diagnosis not present

## 2017-05-27 NOTE — Progress Notes (Signed)
Subjective:     Timothy Duran is a 71 y.o. male here for discussion regarding weight loss.He states that he has been struggling with vertigo for several years.  He has had CAT scans and MRI.  Per patient his neurologist feels that dizziness is related to migraine HAs and started him on propanolol.  The patient states he has a friend that told him he has a doctor give him a diet and told him to stop eating hamburgers and his friend's vertigo resolved.  Timothy Duran wants to know if weight loss could help with vertigo.  He has noted a weight gain of approximately 30 pounds over the last 4 years. He feels ideal weight is 170 pounds. Weight at graduation from high school was 155 pounds. History of eating disorders: none. There is a family history positive for obesity in the patient and brother. Previous treatments for obesity include none. Obesity associated medical conditions: hypertension. Obesity associated medications: none. Cardiovascular risk factors besides obesity: advanced age (older than 64 for men, 12 for women), hypertension, male gender, obesity (BMI >= 30 kg/m2) and sedentary lifestyle.  Current diet:  Breakfast - 2 boiled eggs or sausage biscuit Lunch - sandwich, vienna suasages or pork n beans; chips Supper - meat and vegetables; eats at [employer] once per week and [employer] once per week Snacks - cookies, sweets Beverages - coffee, gatorade, water, sweet tea.  The following portions of the patient's history were reviewed and updated as appropriate: allergies, current medications, past family history, past medical history, past social history, past surgical history and problem list.  Review of Systems Cardiovascular: positive for fatigue, negative for chest pressure/discomfort, lower extremity edema and palpitations Neurological: positive for dizziness and vertigo  Negative for pain / headaches   Objective:    Body mass index is 32.99 kg/m.   Assessment:    Obesity. I assessed Timothy Duran to  be in an action stage with respect to weight loss.   Vertigo / migraine HAs   Plan:   Behavioral treatment: recommend that he keep a food and HA diary to try to pin point foods that might be causing dizziness / HAs. Diet interventions: moderate (500 kCal/d) deficit diet and limiting sweets / high calorie foods; recommended increase lean proteins and non starchy vegetables. Sample diet and food list given to patient Eliminate sugar containing beverages.  Eliminate fried foods - choose baked, grilled or broiled instead.  Informal exercise measures discussed, e.g. taking stairs instead of elevator. Regular aerobic exercise program discussed. Medication: none. I discussed that I did not know of particular diet for vertigo but gave hiime infomration about limiting food / beverages that can increase HA - caffeine, chocolate, ages cheeses, artificial sweetners, MSG

## 2017-06-02 ENCOUNTER — Other Ambulatory Visit: Payer: Self-pay | Admitting: Nurse Practitioner

## 2017-06-02 DIAGNOSIS — I10 Essential (primary) hypertension: Secondary | ICD-10-CM

## 2017-06-02 NOTE — Telephone Encounter (Signed)
Next OV 06/04/17

## 2017-06-04 ENCOUNTER — Ambulatory Visit (INDEPENDENT_AMBULATORY_CARE_PROVIDER_SITE_OTHER): Payer: Medicare Other | Admitting: Nurse Practitioner

## 2017-06-04 ENCOUNTER — Encounter: Payer: Self-pay | Admitting: Nurse Practitioner

## 2017-06-04 VITALS — BP 108/71 | HR 64 | Temp 96.8°F | Ht 68.0 in | Wt 213.0 lb

## 2017-06-04 DIAGNOSIS — K219 Gastro-esophageal reflux disease without esophagitis: Secondary | ICD-10-CM | POA: Diagnosis not present

## 2017-06-04 DIAGNOSIS — Z125 Encounter for screening for malignant neoplasm of prostate: Secondary | ICD-10-CM | POA: Diagnosis not present

## 2017-06-04 DIAGNOSIS — R42 Dizziness and giddiness: Secondary | ICD-10-CM | POA: Diagnosis not present

## 2017-06-04 DIAGNOSIS — Z6828 Body mass index (BMI) 28.0-28.9, adult: Secondary | ICD-10-CM | POA: Diagnosis not present

## 2017-06-04 DIAGNOSIS — I10 Essential (primary) hypertension: Secondary | ICD-10-CM

## 2017-06-04 DIAGNOSIS — G459 Transient cerebral ischemic attack, unspecified: Secondary | ICD-10-CM | POA: Diagnosis not present

## 2017-06-04 MED ORDER — OLMESARTAN MEDOXOMIL-HCTZ 20-12.5 MG PO TABS: 1.0000 | ORAL_TABLET | Freq: Every day | ORAL | 1 refills | Status: DC

## 2017-06-04 MED ORDER — CLOPIDOGREL BISULFATE 75 MG PO TABS: 75.0000 mg | ORAL_TABLET | Freq: Every day | ORAL | 1 refills | Status: DC

## 2017-06-04 MED ORDER — PROPRANOLOL HCL 20 MG PO TABS: 20.0000 mg | ORAL_TABLET | Freq: Two times a day (BID) | ORAL | 1 refills | Status: DC

## 2017-06-04 NOTE — Patient Instructions (Signed)
Exercising to Lose Weight Exercising can help you to lose weight. In order to lose weight through exercise, you need to do vigorous-intensity exercise. You can tell that you are exercising with vigorous intensity if you are breathing very hard and fast and cannot hold a conversation while exercising. Moderate-intensity exercise helps to maintain your current weight. You can tell that you are exercising at a moderate level if you have a higher heart rate and faster breathing, but you are still able to hold a conversation. How often should I exercise? Choose an activity that you enjoy and set realistic goals. Your health care provider can help you to make an activity plan that works for you. Exercise regularly as directed by your health care provider. This may include:  Doing resistance training twice each week, such as: ? Push-ups. ? Sit-ups. ? Lifting weights. ? Using resistance bands.  Doing a given intensity of exercise for a given amount of time. Choose from these options: ? 150 minutes of moderate-intensity exercise every week. ? 75 minutes of vigorous-intensity exercise every week. ? A mix of moderate-intensity and vigorous-intensity exercise every week.  Children, pregnant women, people who are out of shape, people who are overweight, and older adults may need to consult a health care provider for individual recommendations. If you have any sort of medical condition, be sure to consult your health care provider before starting a new exercise program. What are some activities that can help me to lose weight?  Walking at a rate of at least 4.5 miles an hour.  Jogging or running at a rate of 5 miles per hour.  Biking at a rate of at least 10 miles per hour.  Lap swimming.  Roller-skating or in-line skating.  Cross-country skiing.  Vigorous competitive sports, such as football, basketball, and soccer.  Jumping rope.  Aerobic dancing. How can I be more active in my day-to-day  activities?  Use the stairs instead of the elevator.  Take a walk during your lunch break.  If you drive, park your car farther away from work or school.  If you take public transportation, get off one stop early and walk the rest of the way.  Make all of your phone calls while standing up and walking around.  Get up, stretch, and walk around every 30 minutes throughout the day. What guidelines should I follow while exercising?  Do not exercise so much that you hurt yourself, feel dizzy, or get very short of breath.  Consult your health care provider prior to starting a new exercise program.  Wear comfortable clothes and shoes with good support.  Drink plenty of water while you exercise to prevent dehydration or heat stroke. Body water is lost during exercise and must be replaced.  Work out until you breathe faster and your heart beats faster. This information is not intended to replace advice given to you by your health care provider. Make sure you discuss any questions you have with your health care provider. Document Released: 11/07/2010 Document Revised: 03/12/2016 Document Reviewed: 03/08/2014 Elsevier Interactive Patient Education  2018 Else[employer] Inc[employer]  

## 2017-06-04 NOTE — Progress Notes (Signed)
Subjective:    Patient ID: Timothy Duran, male    DOB: Nov 23, 1945, 71 y.o.   MRN: [id number]  HPI  Timothy Duran is here today for follow up of chronic medical problem.  Outpatient Encounter Prescriptions as of 06/04/2017  Medication Sig  . clopidogrel (PLAVIX) 75 MG tablet Take 1 Tablet by mouth once daily  . olmesartan-hydrochlorothiazide (BENICAR HCT) 20-12.5 MG tablet Take 1 Tablet by mouth once daily  . propranolol (INDERAL) 20 MG tablet Take 1 Tablet by mouth 2 times a day   No facility-administered encounter medications on file as of 06/04/2017.     1. Essential hypertension  No c/o chest pain, SOB or headache. Does not check blood pressure at home  2. Gastroesophageal reflux disease without esophagitis  Uses OTC meds as needed  3. Vertigo  Just says he feels light headed if he gets up to fast  4. BMI 28.0-28.9,adult  No significant  weight changes. Has lost 4 lbs.  5. Transient cerebral ischemia, unspecified type  He does not think he has had anymore episodes of TIA's    New complaints: Nothing new  Social history: Lives with wife- really close to his grandson, who is going to firefighter school    Review of Systems  Constitutional: Negative for activity change and appetite change.  HENT: Negative.   Eyes: Negative for pain.  Respiratory: Negative for shortness of breath.   Cardiovascular: Negative for chest pain, palpitations and leg swelling.  Gastrointestinal: Negative for abdominal pain.  Endocrine: Negative for polydipsia.  Genitourinary: Negative.   Skin: Negative for rash.  Neurological: Negative for dizziness, weakness and headaches.  Hematological: Does not bruise/bleed easily.  Psychiatric/Behavioral: Negative.   All other systems reviewed and are negative.      Objective:   Physical Exam  Constitutional: He is oriented to person, place, and time. He appears well-developed and well-nourished.  HENT:  Head: Normocephalic.  Right Ear: External  ear normal.  Left Ear: External ear normal.  Nose: Nose normal.  Mouth/Throat: Oropharynx is clear and moist.  Eyes: Pupils are equal, round, and reactive to light. EOM are normal.  Neck: Normal range of motion. Neck supple. No JVD present. No thyromegaly present.  Cardiovascular: Normal rate, regular rhythm, normal heart sounds and intact distal pulses.  Exam reveals no gallop and no friction rub.   No murmur heard. Pulmonary/Chest: Effort normal and breath sounds normal. No respiratory distress. He has no wheezes. He has no rales. He exhibits no tenderness.  Abdominal: Soft. Bowel sounds are normal. He exhibits no mass. There is no tenderness.  Abdominal wall hernia  Genitourinary: Prostate normal and penis normal.  Musculoskeletal: Normal range of motion. He exhibits no edema.  Lymphadenopathy:    He has no cervical adenopathy.  Neurological: He is alert and oriented to person, place, and time. No cranial nerve deficit.  Skin: Skin is warm and dry.  Psychiatric: He has a normal mood and affect. His behavior is normal. Judgment and thought content normal.   BP 108/71   Pulse 64   Temp (!) 96.8 F (36 C) (Oral)   Ht '5\' 8"'  (1.727 m)   Wt 213 lb (96.6 kg)   BMI 32.39 kg/m       Assessment & Plan:  1. Essential hypertension Low sodium diet - olmesartan-hydrochlorothiazide (BENICAR HCT) 20-12.5 MG tablet; Take 1 tablet by mouth daily.  Dispense: 90 tablet; Refill: 1 - propranolol (INDERAL) 20 MG tablet; Take 1 tablet (20 mg total)  by mouth 2 (two) times daily.  Dispense: 180 tablet; Refill: 1 - CMP14+EGFR - Lipid panel  2. Gastroesophageal reflux disease without esophagitis Avoid spicy foods Do not eat 2 hours prior to bedtime  3. Vertigo Rise slowly from sitting and standing  4. BMI 28.0-28.9,adult Discussed diet and exercise for person with BMI >25 Will recheck weight in 3-6 months  5. Transient cerebral ischemia, unspecified type - clopidogrel (PLAVIX) 75 MG tablet;  Take 1 tablet (75 mg total) by mouth daily.  Dispense: 90 tablet; Refill: 1  6. Prostate cancer screening - PSA, total and free    Labs pending Health maintenance reviewed Diet and exercise encouraged Continue all meds Follow up  In 6 months   [place], FNP

## 2017-06-05 LAB — CMP14+EGFR
A/G RATIO: 2 (ref 1.2–2.2)
ALBUMIN: 4.3 g/dL (ref 3.5–4.8)
ALK PHOS: 78 IU/L (ref 39–117)
ALT: 19 IU/L (ref 0–44)
AST: 20 IU/L (ref 0–40)
BILIRUBIN TOTAL: 0.3 mg/dL (ref 0.0–1.2)
BUN / CREAT RATIO: 21 (ref 10–24)
BUN: 21 mg/dL (ref 8–27)
CHLORIDE: 104 mmol/L (ref 96–106)
CO2: 21 mmol/L (ref 20–29)
CREATININE: 0.98 mg/dL (ref 0.76–1.27)
Calcium: 10.2 mg/dL (ref 8.6–10.2)
GFR calc Af Amer: 88 mL/min/{1.73_m2} (ref >59)
GFR calc non Af Amer: 76 mL/min/{1.73_m2} (ref >59)
GLOBULIN, TOTAL: 2.1 g/dL (ref 1.5–4.5)
Glucose: 90 mg/dL (ref 65–99)
POTASSIUM: 4.8 mmol/L (ref 3.5–5.2)
SODIUM: 141 mmol/L (ref 134–144)
Total Protein: 6.4 g/dL (ref 6.0–8.5)

## 2017-06-05 LAB — LIPID PANEL
CHOL/HDL RATIO: 6.2 ratio — AB (ref 0.0–5.0)
CHOLESTEROL TOTAL: 210 mg/dL — AB (ref 100–199)
HDL: 34 mg/dL — ABNORMAL LOW (ref >39)
LDL CALC: 141 mg/dL — AB (ref 0–99)
TRIGLYCERIDES: 176 mg/dL — AB (ref 0–149)
VLDL Cholesterol Cal: 35 mg/dL (ref 5–40)

## 2017-06-05 LAB — PSA, TOTAL AND FREE

## 2017-06-07 ENCOUNTER — Other Ambulatory Visit: Payer: Self-pay | Admitting: Nurse Practitioner

## 2017-06-07 MED ORDER — ROSUVASTATIN CALCIUM 10 MG PO TABS: 10.0000 mg | ORAL_TABLET | Freq: Every day | ORAL | 1 refills | Status: DC

## 2018-01-28 ENCOUNTER — Ambulatory Visit: Payer: Medicare Other | Admitting: Family Medicine

## 2018-01-28 ENCOUNTER — Encounter: Payer: Self-pay | Admitting: Family Medicine

## 2018-01-28 VITALS — BP 127/87 | HR 81 | Temp 98.3°F | Ht 68.0 in | Wt 218.0 lb

## 2018-01-28 DIAGNOSIS — J0100 Acute maxillary sinusitis, unspecified: Secondary | ICD-10-CM

## 2018-01-28 LAB — CULTURE, GROUP A STREP

## 2018-01-28 LAB — RAPID STREP SCREEN (MED CTR MEBANE ONLY): STREP GP A AG, IA W/REFLEX: NEGATIVE

## 2018-01-28 MED ORDER — AZITHROMYCIN 250 MG PO TABS: ORAL_TABLET | ORAL | 0 refills | Status: DC

## 2018-01-28 NOTE — Addendum Note (Signed)
Addended by: Caryl Pina on: 01/28/2018 11:33 AM   Modules accepted: Orders

## 2018-01-28 NOTE — Progress Notes (Signed)
BP 127/87   Pulse 81   Temp 98.3 F (36.8 C) (Oral)   Ht 5\' 8"  (1.727 m)   Wt 218 lb (98.9 kg)   BMI 33.15 kg/m    Subjective:    Patient ID: Timothy Duran, male    DOB: 23-Sep-1946, 72 y.o.   MRN: [id number]  HPI: Timothy Duran is a 72 y.o. male presenting on 01/28/2018 for Sore throat, runny nose, cough, chest congestion   HPI Patient comes in complaining of cough and sinus congestion and nasal drainage and sore throat that has been going on for the past 2 days.  He denies any fevers or chills or shortness of breath or wheezing.  He does feel a little achiness as well along with it.  He has been trying some Tylenol but that has not been helping significantly.  Relevant past medical, surgical, family and social history reviewed and updated as indicated. Interim medical history since our last visit reviewed. Allergies and medications reviewed and updated.  Review of Systems  Constitutional: Negative for chills and fever.  HENT: Positive for congestion, postnasal drip, rhinorrhea, sinus pressure, sneezing and sore throat. Negative for ear discharge, ear pain and voice change.   Eyes: Negative for pain, discharge, redness and visual disturbance.  Respiratory: Negative for shortness of breath and wheezing.   Cardiovascular: Negative for chest pain and leg swelling.  Musculoskeletal: Negative for gait problem.  Skin: Negative for rash.  All other systems reviewed and are negative.   Per HPI unless specifically indicated above   Allergies as of 01/28/2018      Reactions   Other    Nasal packing- headaches and facial pain   Penicillins    Blisters  Has patient had a PCN reaction causing immediate rash, facial/tongue/throat swelling, SOB or lightheadedness with hypotension: (Unknown} Has patient had a PCN reaction causing severe rash involving mucus membranes or skin necrosis: Unknown Has patient had a PCN reaction that required hospitalization No Has patient had a PCN reaction  occurring within the last 10 years: No If all of the above answers are "NO", then may proceed with Cephalosporin use.      Medication List        Accurate as of 01/28/18 11:02 AM. Always use your most recent med list.          clopidogrel 75 MG tablet Commonly known as:  PLAVIX Take 1 tablet (75 mg total) by mouth daily.   olmesartan-hydrochlorothiazide 20-12.5 MG tablet Commonly known as:  BENICAR HCT Take 1 tablet by mouth daily.   propranolol 20 MG tablet Commonly known as:  INDERAL Take 1 tablet (20 mg total) by mouth 2 (two) times daily.          Objective:    BP 127/87   Pulse 81   Temp 98.3 F (36.8 C) (Oral)   Ht 5\' 8"  (1.727 m)   Wt 218 lb (98.9 kg)   BMI 33.15 kg/m   Wt Readings from Last 3 Encounters:  01/28/18 218 lb (98.9 kg)  06/04/17 213 lb (96.6 kg)  05/27/17 217 lb (98.4 kg)    Physical Exam  Constitutional: He is oriented to person, place, and time. He appears well-developed and well-nourished. No distress.  HENT:  Right Ear: Tympanic membrane, external ear and ear canal normal.  Left Ear: Tympanic membrane, external ear and ear canal normal.  Nose: Mucosal edema and rhinorrhea present. No sinus tenderness. No epistaxis. Right sinus exhibits maxillary sinus tenderness. Right  sinus exhibits no frontal sinus tenderness. Left sinus exhibits maxillary sinus tenderness. Left sinus exhibits no frontal sinus tenderness.  Mouth/Throat: Uvula is midline and mucous membranes are normal. Posterior oropharyngeal edema and posterior oropharyngeal erythema present. No oropharyngeal exudate or tonsillar abscesses.  Eyes: Pupils are equal, round, and reactive to light. Conjunctivae and EOM are normal. Right eye exhibits no discharge. No scleral icterus.  Neck: Neck supple. No thyromegaly present.  Cardiovascular: Normal rate, regular rhythm, normal heart sounds and intact distal pulses.  No murmur heard. Pulmonary/Chest: Effort normal and breath sounds normal.  No respiratory distress. He has no wheezes. He has no rales.  Musculoskeletal: Normal range of motion. He exhibits no edema.  Lymphadenopathy:    He has no cervical adenopathy.  Neurological: He is alert and oriented to person, place, and time. Coordination normal.  Skin: Skin is warm and dry. No rash noted. He is not diaphoretic.  Psychiatric: He has a normal mood and affect. His behavior is normal.  Nursing note and vitals reviewed.   Rapid strep negative    Assessment & Plan:   Problem List Items Addressed This Visit    None    Visit Diagnoses    Acute non-recurrent maxillary sinusitis    -  Primary   Relevant Orders   Rapid Strep Screen ([facility] & [facility] ONLY)       Follow up plan: Return if symptoms worsen or fail to improve.  Counseling provided for all of the vaccine components Orders Placed This Encounter  Procedures  . Rapid Strep Screen ([facility] & [facility] ONLY)    Caryl Pina, MD [facility] 01/28/2018, 11:02 AM

## 2018-02-09 ENCOUNTER — Other Ambulatory Visit: Payer: Self-pay | Admitting: Nurse Practitioner

## 2018-02-09 DIAGNOSIS — I10 Essential (primary) hypertension: Secondary | ICD-10-CM

## 2018-04-07 ENCOUNTER — Other Ambulatory Visit: Payer: Self-pay

## 2018-04-07 DIAGNOSIS — I10 Essential (primary) hypertension: Secondary | ICD-10-CM

## 2018-04-07 MED ORDER — OLMESARTAN MEDOXOMIL-HCTZ 20-12.5 MG PO TABS: 1.0000 | ORAL_TABLET | Freq: Every day | ORAL | 0 refills | Status: DC

## 2018-05-16 ENCOUNTER — Other Ambulatory Visit: Payer: Self-pay | Admitting: Nurse Practitioner

## 2018-05-16 DIAGNOSIS — G459 Transient cerebral ischemic attack, unspecified: Secondary | ICD-10-CM

## 2018-05-16 NOTE — Telephone Encounter (Signed)
Last seen 01/28/18  MMM

## 2018-07-21 ENCOUNTER — Telehealth: Payer: Self-pay | Admitting: Nurse Practitioner

## 2018-07-21 NOTE — Telephone Encounter (Signed)
Offered patient appt and he stated that he went to Urgent Care this AM and they took care of him. Advised patient to contact our office if he needs anything else

## 2018-08-02 ENCOUNTER — Other Ambulatory Visit: Payer: Self-pay | Admitting: Nurse Practitioner

## 2018-08-02 DIAGNOSIS — I10 Essential (primary) hypertension: Secondary | ICD-10-CM

## 2018-08-05 ENCOUNTER — Telehealth: Payer: Self-pay | Admitting: *Deleted

## 2018-08-05 DIAGNOSIS — I10 Essential (primary) hypertension: Secondary | ICD-10-CM

## 2018-08-05 MED ORDER — OLMESARTAN MEDOXOMIL-HCTZ 20-12.5 MG PO TABS: 1.0000 | ORAL_TABLET | Freq: Every day | ORAL | 0 refills | Status: DC

## 2018-08-05 NOTE — Telephone Encounter (Signed)
Pt aware 30 days sent to [facility], they had it in stock. Appt made for 08/24/18

## 2018-08-24 ENCOUNTER — Encounter: Payer: Self-pay | Admitting: Nurse Practitioner

## 2018-08-24 ENCOUNTER — Ambulatory Visit: Payer: Medicare Other | Admitting: Nurse Practitioner

## 2018-08-24 VITALS — BP 118/75 | HR 92 | Temp 96.8°F | Ht 68.0 in | Wt 217.0 lb

## 2018-08-24 DIAGNOSIS — R42 Dizziness and giddiness: Secondary | ICD-10-CM

## 2018-08-24 DIAGNOSIS — K219 Gastro-esophageal reflux disease without esophagitis: Secondary | ICD-10-CM | POA: Diagnosis not present

## 2018-08-24 DIAGNOSIS — Z6828 Body mass index (BMI) 28.0-28.9, adult: Secondary | ICD-10-CM | POA: Diagnosis not present

## 2018-08-24 DIAGNOSIS — I10 Essential (primary) hypertension: Secondary | ICD-10-CM

## 2018-08-24 DIAGNOSIS — E782 Mixed hyperlipidemia: Secondary | ICD-10-CM

## 2018-08-24 DIAGNOSIS — G459 Transient cerebral ischemic attack, unspecified: Secondary | ICD-10-CM

## 2018-08-24 LAB — CMP14+EGFR
ALK PHOS: 79 IU/L (ref 39–117)
ALT: 24 IU/L (ref 0–44)
AST: 16 IU/L (ref 0–40)
Albumin/Globulin Ratio: 2.2 (ref 1.2–2.2)
Albumin: 4.4 g/dL (ref 3.5–4.8)
BILIRUBIN TOTAL: 0.3 mg/dL (ref 0.0–1.2)
BUN/Creatinine Ratio: 16 (ref 10–24)
BUN: 15 mg/dL (ref 8–27)
CHLORIDE: 100 mmol/L (ref 96–106)
CO2: 23 mmol/L (ref 20–29)
Calcium: 10.5 mg/dL — ABNORMAL HIGH (ref 8.6–10.2)
Creatinine, Ser: 0.95 mg/dL (ref 0.76–1.27)
GFR calc Af Amer: 91 mL/min/{1.73_m2} (ref >59)
GFR calc non Af Amer: 79 mL/min/{1.73_m2} (ref >59)
GLUCOSE: 85 mg/dL (ref 65–99)
Globulin, Total: 2 g/dL (ref 1.5–4.5)
Potassium: 4.7 mmol/L (ref 3.5–5.2)
Sodium: 137 mmol/L (ref 134–144)
Total Protein: 6.4 g/dL (ref 6.0–8.5)

## 2018-08-24 LAB — LIPID PANEL
CHOLESTEROL TOTAL: 233 mg/dL — AB (ref 100–199)
Chol/HDL Ratio: 5.7 ratio — ABNORMAL HIGH (ref 0.0–5.0)
HDL: 41 mg/dL (ref >39)
LDL Calculated: 149 mg/dL — ABNORMAL HIGH (ref 0–99)
Triglycerides: 213 mg/dL — ABNORMAL HIGH (ref 0–149)
VLDL CHOLESTEROL CAL: 43 mg/dL — AB (ref 5–40)

## 2018-08-24 MED ORDER — OLMESARTAN MEDOXOMIL-HCTZ 20-12.5 MG PO TABS: 1.0000 | ORAL_TABLET | Freq: Every day | ORAL | 1 refills | Status: DC

## 2018-08-24 MED ORDER — CLOPIDOGREL BISULFATE 75 MG PO TABS: 75.0000 mg | ORAL_TABLET | Freq: Every day | ORAL | 1 refills | Status: DC

## 2018-08-24 MED ORDER — MECLIZINE HCL 25 MG PO TABS
25.0000 mg | ORAL_TABLET | Freq: Three times a day (TID) | ORAL | 0 refills | Status: DC | PRN
Start: 2018-08-24 — End: 2019-07-03

## 2018-08-24 NOTE — Progress Notes (Signed)
Subjective:    Patient ID: Timothy Duran, male    DOB: 15-Jan-1946, 72 y.o.   MRN: [id number]   Chief Complaint: medical management of chronic issues  HPI:  1. Essential hypertension  No c/o chest pain,sob or headache. Does not check blood pressure at home. BP Readings from Last 3 Encounters:  08/24/18 118/75  01/28/18 127/87  06/04/17 108/71     2. Gastroesophageal reflux disease without esophagitis  Not taking rx meds. Uses OTC meds as needed.  3. Vertigo went over 9 months with no dizziness and then had 2 epiisodes 2 weks ago.  4. BMI 28.0-28.9,adult  No recent weight changes  5. TIA (transient ischemic attack)  Has had no recent attacks in over 2 years.    Outpatient Encounter Medications as of 08/24/2018  Medication Sig  . azithromycin (ZITHROMAX) 250 MG tablet Take 2 the first day and then one each day after.  . clopidogrel (PLAVIX) 75 MG tablet TAKE 1 TABLET BY MOUTH ONCE DAILY  . olmesartan-hydrochlorothiazide (BENICAR HCT) 20-12.5 MG tablet Take 1 tablet by mouth daily.  . propranolol (INDERAL) 20 MG tablet Take 1 tablet (20 mg total) by mouth 2 (two) times daily. (Needs to be seen before next refill)     New complaints: None today  Social history: Lives with wife of over 31 years.   Review of Systems  Constitutional: Negative for activity change and appetite change.  HENT: Negative.   Eyes: Negative for pain.  Respiratory: Negative for shortness of breath.   Cardiovascular: Negative for chest pain, palpitations and leg swelling.  Gastrointestinal: Negative for abdominal pain.  Endocrine: Negative for polydipsia.  Genitourinary: Negative.   Skin: Negative for rash.  Neurological: Negative for dizziness, weakness and headaches.  Hematological: Does not bruise/bleed easily.  Psychiatric/Behavioral: Negative.   All other systems reviewed and are negative.      Objective:   Physical Exam  Constitutional: He is oriented to person, place, and time. He  appears well-developed and well-nourished.  HENT:  Head: Normocephalic.  Nose: Nose normal.  Mouth/Throat: Oropharynx is clear and moist.  Eyes: Pupils are equal, round, and reactive to light. EOM are normal.  Neck: Normal range of motion and phonation normal. Neck supple. No JVD present. Carotid bruit is not present. No thyroid mass and no thyromegaly present.  Cardiovascular: Normal rate and regular rhythm.  Pulmonary/Chest: Effort normal and breath sounds normal. No respiratory distress.  Abdominal: Soft. Normal appearance, normal aorta and bowel sounds are normal. There is no tenderness.  Musculoskeletal: Normal range of motion.  Lymphadenopathy:    He has no cervical adenopathy.  Neurological: He is alert and oriented to person, place, and time.  Skin: Skin is warm and dry.  Psychiatric: He has a normal mood and affect. His behavior is normal. Judgment and thought content normal.  Nursing note and vitals reviewed.   BP 118/75   Pulse 92   Temp (!) 96.8 F (36 C) (Oral)   Ht 5\' 8"  (1.727 m)   Wt 217 lb (98.4 kg)   BMI 32.99 kg/m        Assessment & Plan:  Timothy Duran comes in today with chief complaint of Medical Management of Chronic Issues   Diagnosis and orders addressed:  1. Essential hypertension Low sodium diet - olmesartan-hydrochlorothiazide (BENICAR HCT) 20-12.5 MG tablet; Take 1 tablet by mouth daily.  Dispense: 90 tablet; Refill: 1  2. Gastroesophageal reflux disease without esophagitis Avoid spicy foods Do not eat 2  hours prior to bedtime  3. Vertigo - meclizine (ANTIVERT) 25 MG tablet; Take 1 tablet (25 mg total) by mouth 3 (three) times daily as needed for dizziness.  Dispense: 30 tablet; Refill: 0  4. BMI 28.0-28.9,adult Discussed diet and exercise for person with BMI >25 Will recheck weight in 3-6 months  5. TIA (transient ischemic attack) - clopidogrel (PLAVIX) 75 MG tablet; Take 1 tablet (75 mg total) by mouth daily.  Dispense: 180 tablet;  Refill: 1   Labs pending Health Maintenance reviewed Diet and exercise encouraged  Follow up plan: 3 months   Mary-Margaret Hassell Done, FNP

## 2018-08-24 NOTE — Addendum Note (Signed)
Addended by: Chevis Pretty on: 08/24/2018 09:27 AM   Modules accepted: Orders

## 2018-08-24 NOTE — Patient Instructions (Signed)
DASH Eating Plan DASH stands for "Dietary Approaches to Stop Hypertension." The DASH eating plan is a healthy eating plan that has been shown to reduce high blood pressure (hypertension). It may also reduce your risk for type 2 diabetes, heart disease, and stroke. The DASH eating plan may also help with weight loss. What are tips for following this plan? General guidelines  Avoid eating more than 2,300 mg (milligrams) of salt (sodium) a day. If you have hypertension, you may need to reduce your sodium intake to 1,500 mg a day.  Limit alcohol intake to no more than 1 drink a day for nonpregnant women and 2 drinks a day for men. One drink equals 12 oz of beer, 5 oz of wine, or 1 oz of hard liquor.  Work with your health care provider to maintain a healthy body weight or to lose weight. Ask what an ideal weight is for you.  Get at least 30 minutes of exercise that causes your heart to beat faster (aerobic exercise) most days of the week. Activities may include walking, swimming, or biking.  Work with your health care provider or diet and nutrition specialist (dietitian) to adjust your eating plan to your individual calorie needs. Reading food labels  Check food labels for the amount of sodium per serving. Choose foods with less than 5 percent of the Daily Value of sodium. Generally, foods with less than 300 mg of sodium per serving fit into this eating plan.  To find whole grains, look for the word "whole" as the first word in the ingredient list. Shopping  Buy products labeled as "low-sodium" or "no salt added."  Buy fresh foods. Avoid canned foods and premade or frozen meals. Cooking  Avoid adding salt when cooking. Use salt-free seasonings or herbs instead of table salt or sea salt. Check with your health care provider or pharmacist before using salt substitutes.  Do not fry foods. Cook foods using healthy methods such as baking, boiling, grilling, and broiling instead.  Cook with  heart-healthy oils, such as olive, canola, soybean, or sunflower oil. Meal planning   Eat a balanced diet that includes: ? 5 or more servings of fruits and vegetables each day. At each meal, try to fill half of your plate with fruits and vegetables. ? Up to 6-8 servings of whole grains each day. ? Less than 6 oz of lean meat, poultry, or fish each day. A 3-oz serving of meat is about the same size as a deck of cards. One egg equals 1 oz. ? 2 servings of low-fat dairy each day. ? A serving of nuts, seeds, or beans 5 times each week. ? Heart-healthy fats. Healthy fats called Omega-3 fatty acids are found in foods such as flaxseeds and coldwater fish, like sardines, salmon, and mackerel.  Limit how much you eat of the following: ? Canned or prepackaged foods. ? Food that is high in trans fat, such as fried foods. ? Food that is high in saturated fat, such as fatty meat. ? Sweets, desserts, sugary drinks, and other foods with added sugar. ? Full-fat dairy products.  Do not salt foods before eating.  Try to eat at least 2 vegetarian meals each week.  Eat more home-cooked food and less restaurant, buffet, and fast food.  When eating at a restaurant, ask that your food be prepared with less salt or no salt, if possible. What foods are recommended? The items listed may not be a complete list. Talk with your dietitian about what   dietary choices are best for you. Grains Whole-grain or whole-wheat bread. Whole-grain or whole-wheat pasta. Brown rice. Oatmeal. Quinoa. Bulgur. Whole-grain and low-sodium cereals. Pita bread. Low-fat, low-sodium crackers. Whole-wheat flour tortillas. Vegetables Fresh or frozen vegetables (raw, steamed, roasted, or grilled). Low-sodium or reduced-sodium tomato and vegetable juice. Low-sodium or reduced-sodium tomato sauce and tomato paste. Low-sodium or reduced-sodium canned vegetables. Fruits All fresh, dried, or frozen fruit. Canned fruit in natural juice (without  added sugar). Meat and other protein foods Skinless chicken or turkey. Ground chicken or turkey. Pork with fat trimmed off. Fish and seafood. Egg whites. Dried beans, peas, or lentils. Unsalted nuts, nut butters, and seeds. Unsalted canned beans. Lean cuts of beef with fat trimmed off. Low-sodium, lean deli meat. Dairy Low-fat (1%) or fat-free (skim) milk. Fat-free, low-fat, or reduced-fat cheeses. Nonfat, low-sodium ricotta or cottage cheese. Low-fat or nonfat yogurt. Low-fat, low-sodium cheese. Fats and oils Soft margarine without trans fats. Vegetable oil. Low-fat, reduced-fat, or light mayonnaise and salad dressings (reduced-sodium). Canola, safflower, olive, soybean, and sunflower oils. Avocado. Seasoning and other foods Herbs. Spices. Seasoning mixes without salt. Unsalted popcorn and pretzels. Fat-free sweets. What foods are not recommended? The items listed may not be a complete list. Talk with your dietitian about what dietary choices are best for you. Grains Baked goods made with fat, such as croissants, muffins, or some breads. Dry pasta or rice meal packs. Vegetables Creamed or fried vegetables. Vegetables in a cheese sauce. Regular canned vegetables (not low-sodium or reduced-sodium). Regular canned tomato sauce and paste (not low-sodium or reduced-sodium). Regular tomato and vegetable juice (not low-sodium or reduced-sodium). Pickles. Olives. Fruits Canned fruit in a light or heavy syrup. Fried fruit. Fruit in cream or butter sauce. Meat and other protein foods Fatty cuts of meat. Ribs. Fried meat. Bacon. Sausage. Bologna and other processed lunch meats. Salami. Fatback. Hotdogs. Bratwurst. Salted nuts and seeds. Canned beans with added salt. Canned or smoked fish. Whole eggs or egg yolks. Chicken or turkey with skin. Dairy Whole or 2% milk, cream, and half-and-half. Whole or full-fat cream cheese. Whole-fat or sweetened yogurt. Full-fat cheese. Nondairy creamers. Whipped toppings.  Processed cheese and cheese spreads. Fats and oils Butter. Stick margarine. Lard. Shortening. Ghee. Bacon fat. Tropical oils, such as coconut, palm kernel, or palm oil. Seasoning and other foods Salted popcorn and pretzels. Onion salt, garlic salt, seasoned salt, table salt, and sea salt. Worcestershire sauce. Tartar sauce. Barbecue sauce. Teriyaki sauce. Soy sauce, including reduced-sodium. Steak sauce. Canned and packaged gravies. Fish sauce. Oyster sauce. Cocktail sauce. Horseradish that you find on the shelf. Ketchup. Mustard. Meat flavorings and tenderizers. Bouillon cubes. Hot sauce and Tabasco sauce. Premade or packaged marinades. Premade or packaged taco seasonings. Relishes. Regular salad dressings. Where to find more information:  National Heart, Lung, and Blood Institute: www.nhlbi.nih.gov  American Heart Association: www.heart.org Summary  The DASH eating plan is a healthy eating plan that has been shown to reduce high blood pressure (hypertension). It may also reduce your risk for type 2 diabetes, heart disease, and stroke.  With the DASH eating plan, you should limit salt (sodium) intake to 2,300 mg a day. If you have hypertension, you may need to reduce your sodium intake to 1,500 mg a day.  When on the DASH eating plan, aim to eat more fresh fruits and vegetables, whole grains, lean proteins, low-fat dairy, and heart-healthy fats.  Work with your health care provider or diet and nutrition specialist (dietitian) to adjust your eating plan to your individual   calorie needs. This information is not intended to replace advice given to you by your health care provider. Make sure you discuss any questions you have with your health care provider. Document Released: 09/24/2011 Document Revised: 09/28/2016 Document Reviewed: 09/28/2016 Elsevier Interactive Patient Education  2018 [employer].  

## 2018-08-25 ENCOUNTER — Encounter: Payer: Self-pay | Admitting: Nurse Practitioner

## 2018-08-25 MED ORDER — ROSUVASTATIN CALCIUM 10 MG PO TABS: 10.0000 mg | ORAL_TABLET | Freq: Every day | ORAL | 1 refills | Status: DC

## 2018-08-25 NOTE — Addendum Note (Signed)
Addended by: Chevis Pretty on: 08/25/2018 01:05 PM   Modules accepted: Orders

## 2019-02-23 ENCOUNTER — Encounter: Payer: Self-pay | Admitting: Nurse Practitioner

## 2019-02-23 ENCOUNTER — Other Ambulatory Visit: Payer: Self-pay

## 2019-02-23 ENCOUNTER — Ambulatory Visit (INDEPENDENT_AMBULATORY_CARE_PROVIDER_SITE_OTHER): Payer: Medicare Other | Admitting: Nurse Practitioner

## 2019-02-23 DIAGNOSIS — Z6828 Body mass index (BMI) 28.0-28.9, adult: Secondary | ICD-10-CM

## 2019-02-23 DIAGNOSIS — I10 Essential (primary) hypertension: Secondary | ICD-10-CM

## 2019-02-23 DIAGNOSIS — E782 Mixed hyperlipidemia: Secondary | ICD-10-CM

## 2019-02-23 DIAGNOSIS — K219 Gastro-esophageal reflux disease without esophagitis: Secondary | ICD-10-CM

## 2019-02-23 DIAGNOSIS — G459 Transient cerebral ischemic attack, unspecified: Secondary | ICD-10-CM | POA: Diagnosis not present

## 2019-02-23 MED ORDER — ROSUVASTATIN CALCIUM 10 MG PO TABS: 10.0000 mg | ORAL_TABLET | Freq: Every day | ORAL | 1 refills | Status: DC

## 2019-02-23 MED ORDER — CLOPIDOGREL BISULFATE 75 MG PO TABS: 75.0000 mg | ORAL_TABLET | Freq: Every day | ORAL | 1 refills | Status: DC

## 2019-02-23 MED ORDER — PROPRANOLOL HCL 20 MG PO TABS: 20.0000 mg | ORAL_TABLET | Freq: Two times a day (BID) | ORAL | 0 refills | Status: DC

## 2019-02-23 MED ORDER — OLMESARTAN MEDOXOMIL-HCTZ 20-12.5 MG PO TABS: 1.0000 | ORAL_TABLET | Freq: Every day | ORAL | 1 refills | Status: DC

## 2019-02-23 NOTE — Progress Notes (Signed)
Virtual Visit via telephone Note  I connected with Timothy Duran on 02/23/19 at 9:00 AM by telephone and verified that I am speaking with the correct person using two identifiers. Timothy Duran is currently located at home and no one is currently with her during visit. The provider, Mary-Margaret Hassell Done, FNP is located in their office at time of visit.  I discussed the limitations, risks, security and privacy concerns of performing an evaluation and management service by telephone and the availability of in person appointments. I also discussed with the patient that there may be a patient responsible charge related to this service. The patient expressed understanding and agreed to proceed.   History and Present Illness:  Chief Complaint: Medical Management of Chronic Issues    HPI:  1. Essential hypertension Denies any SOB , chest pain or headaches. Does not check blood pressure at home. BP Readings from Last 3 Encounters:  08/24/18 118/75  01/28/18 127/87  06/04/17 108/71     2. TIA (transient ischemic attack) Has had no attacks in over a year that he is aware of. Is still on plavix daily.  3. Gastroesophageal reflux disease without esophagitis He tries to control with diet  4. BMI 28.0-28.9,adult No recent weight chnages  5. Hyperlipidemia Does not wathc diet and does no exercise. He does stay busy.  Outpatient Encounter Medications as of 02/23/2019  Medication Sig  . clopidogrel (PLAVIX) 75 MG tablet Take 1 tablet (75 mg total) by mouth daily.  . meclizine (ANTIVERT) 25 MG tablet Take 1 tablet (25 mg total) by mouth 3 (three) times daily as needed for dizziness.  Marland Kitchen olmesartan-hydrochlorothiazide (BENICAR HCT) 20-12.5 MG tablet Take 1 tablet by mouth daily.  . propranolol (INDERAL) 20 MG tablet Take 1 tablet (20 mg total) by mouth 2 (two) times daily. (Needs to be seen before next refill) (Patient not taking: Reported on 08/24/2018)  . rosuvastatin (CRESTOR) 10 MG tablet  Take 1 tablet (10 mg total) by mouth daily.       New complaints: fell a week ago yesterday and fell on right wrist and it is sore, especially when he moves his thumb.  Social history: Lives with his wife of many years     Review of Systems  Constitutional: Negative for diaphoresis and weight loss.  Eyes: Negative for blurred vision, double vision and pain.  Respiratory: Negative for shortness of breath.   Cardiovascular: Negative for chest pain, palpitations, orthopnea and leg swelling.  Gastrointestinal: Negative for abdominal pain.  Skin: Negative for rash.  Neurological: Negative for dizziness, sensory change, loss of consciousness, weakness and headaches.  Endo/Heme/Allergies: Negative for polydipsia. Does not bruise/bleed easily.  Psychiatric/Behavioral: Negative for memory loss. The patient does not have insomnia.   All other systems reviewed and are negative.    Observations/Objective: Alert and oriented- answers all questions apropriately No distress.  Assessment and Plan: Timothy Duran comes in today with chief complaint of Medical Management of Chronic Issues   Diagnosis and orders addressed:  1. Essential hypertension Low sodium diet - olmesartan-hydrochlorothiazide (BENICAR HCT) 20-12.5 MG tablet; Take 1 tablet by mouth daily.  Dispense: 90 tablet; Refill: 1 - propranolol (INDERAL) 20 MG tablet; Take 1 tablet (20 mg total) by mouth 2 (two) times daily. (Needs to be seen before next refill)  Dispense: 60 tablet; Refill: 0  2. TIA (transient ischemic attack) - clopidogrel (PLAVIX) 75 MG tablet; Take 1 tablet (75 mg total) by mouth daily.  Dispense: 180 tablet; Refill:  1  3. Gastroesophageal reflux disease without esophagitis Avoid spicy foods Do not eat 2 hours prior to bedtime  4. BMI 28.0-28.9,adult Discussed diet and exercise for person with BMI >25 Will recheck weight in 3-6 months   5. Mixed hyperlipidemia Low fat diet - rosuvastatin (CRESTOR)  10 MG tablet; Take 1 tablet (10 mg total) by mouth daily.  Dispense: 90 tablet; Refill: 1   Labs pending Health Maintenance reviewed Diet and exercise encouraged  Follow Up Instructions: 3 months    I discussed the assessment and treatment plan with the patient. The patient was provided an opportunity to ask questions and all were answered. The patient agreed with the plan and demonstrated an understanding of the instructions.   The patient was advised to call back or seek an in-person evaluation if the symptoms worsen or if the condition fails to improve as anticipated.  The above assessment and management plan was discussed with the patient. The patient verbalized understanding of and has agreed to the management plan. Patient is aware to call the clinic if symptoms persist or worsen. Patient is aware when to return to the clinic for a follow-up visit. Patient educated on when it is appropriate to go to the emergency department.   Time call ended:  9: 15  I provided 20 minutes of non-face-to-face time during this encounter.    Mary-Margaret Hassell Done, FNP

## 2019-03-03 ENCOUNTER — Telehealth: Payer: Self-pay | Admitting: Nurse Practitioner

## 2019-03-10 ENCOUNTER — Telehealth: Payer: Self-pay | Admitting: Nurse Practitioner

## 2019-05-02 ENCOUNTER — Encounter: Payer: Self-pay | Admitting: Family

## 2019-05-02 ENCOUNTER — Ambulatory Visit (INDEPENDENT_AMBULATORY_CARE_PROVIDER_SITE_OTHER): Payer: Medicare Other | Admitting: Family

## 2019-05-02 DIAGNOSIS — W57XXXA Bitten or stung by nonvenomous insect and other nonvenomous arthropods, initial encounter: Secondary | ICD-10-CM

## 2019-05-02 DIAGNOSIS — S40862A Insect bite (nonvenomous) of left upper arm, initial encounter: Secondary | ICD-10-CM

## 2019-05-02 MED ORDER — DOXYCYCLINE HYCLATE 100 MG PO TABS: 100.0000 mg | ORAL_TABLET | Freq: Two times a day (BID) | ORAL | 0 refills | Status: AC

## 2019-05-02 NOTE — Progress Notes (Signed)
   Virtual Visit via telephone Note  I connected with Timothy Duran on 05/02/19 at 2:08 pm by video and verified that I am speaking with the correct person using two identifiers. Timothy Duran is currently located at work and no one  is currently with her during visit. The provider, Evelina Dun, FNP is located in their office at time of visit.  I discussed the limitations, risks, security and privacy concerns of performing an evaluation and management service by telephone and the availability of in person appointments. I also discussed with the patient that there may be a patient responsible charge related to this service. The patient expressed understanding and agreed to proceed.  History and Present Illness:  HPI Pt calls today  With a tick bite on his left inner arm that he removed three week ago. States it was a small red bump, but now the area is larger with whelps around it. He states it is about the size of a quarter. States it is similar to a bulleye. States he always has joint pain, but it seem worse now. Denies any fever or headache.    Review of Systems  Skin: Positive for rash.  All other systems reviewed and are negative.    Observations/Objective: No SOB or distress noted, area very erythemas circular rash on left medial arm.   Assessment and Plan: 1. Tick bite of left upper arm, initial encounter -Pt to report any new fever, joint pain, or rash -Wear protective clothing while outside- Long sleeves and long pants -Put insect repellent on all exposed skin and along clothing -Take a shower as soon as possible after being outside Do not scratch RTO if symptoms worsen or do not improve  - doxycycline (VIBRA-TABS) 100 MG tablet; Take 1 tablet (100 mg total) by mouth 2 (two) times daily for 21 days.  Dispense: 42 tablet; Refill: 0     I discussed the assessment and treatment plan with the patient. The patient was provided an opportunity to ask questions and all were  answered. The patient agreed with the plan and demonstrated an understanding of the instructions.   The patient was advised to call back or seek an in-person evaluation if the symptoms worsen or if the condition fails to improve as anticipated.  The above assessment and management plan was discussed with the patient. The patient verbalized understanding of and has agreed to the management plan. Patient is aware to call the clinic if symptoms persist or worsen. Patient is aware when to return to the clinic for a follow-up visit. Patient educated on when it is appropriate to go to the emergency department.   Time call ended:  2:23 pm  I provided 15 minutes of non-face-to-face time during this encounter.    Evelina Dun, FNP

## 2019-06-06 ENCOUNTER — Ambulatory Visit (INDEPENDENT_AMBULATORY_CARE_PROVIDER_SITE_OTHER): Payer: Medicare Other

## 2019-06-06 ENCOUNTER — Other Ambulatory Visit: Payer: Self-pay

## 2019-06-06 ENCOUNTER — Encounter: Payer: Self-pay | Admitting: Nurse Practitioner

## 2019-06-06 ENCOUNTER — Ambulatory Visit: Payer: Medicare Other | Admitting: Nurse Practitioner

## 2019-06-06 VITALS — BP 133/78 | HR 84 | Temp 98.9°F | Ht 68.0 in | Wt 218.0 lb

## 2019-06-06 DIAGNOSIS — M25562 Pain in left knee: Secondary | ICD-10-CM

## 2019-06-06 DIAGNOSIS — M25561 Pain in right knee: Secondary | ICD-10-CM | POA: Diagnosis not present

## 2019-06-06 MED ORDER — METHYLPREDNISOLONE ACETATE 40 MG/ML IJ SUSP
40.0000 mg | Freq: Once | INTRAMUSCULAR | Status: AC
  Administered 2019-06-06: 40 mg via INTRAMUSCULAR

## 2019-06-06 MED ORDER — BUPIVACAINE HCL 0.25 % IJ SOLN
1.0000 mL | Freq: Once | INTRAMUSCULAR | Status: AC
  Administered 2019-06-06: 1 mL via INTRA_ARTICULAR

## 2019-06-06 NOTE — Patient Instructions (Signed)
Joint Steroid Injection A joint steroid injection is a procedure to relieve swelling and pain in a joint. Steroids are medicines that reduce inflammation. In this procedure, your health care provider uses a syringe and a needle to inject a steroid medicine into a painful and inflamed joint. A pain-relieving medicine (anesthetic) may be injected along with the steroid. In some cases, your health care provider may use an imaging technique such as ultrasound or fluoroscopy to guide the injection. Joints that are often treated with steroid injections include the knee, shoulder, hip, and spine. These injections may also be used in the elbow, ankle, and joints of the hands or feet. You may have joint steroid injections as part of your treatment for inflammation caused by:  Gout.  Rheumatoid arthritis.  Advanced wear-and-tear arthritis (osteoarthritis).  Tendinitis.  Bursitis. Joint steroid injections may be repeated, but having them too often can damage a joint or the skin over the joint. You should not have joint steroid injections less than 6 weeks apart or more than four times a year. Tell a health care provider about:  Any allergies you have.  All medicines you are taking, including vitamins, herbs, eye drops, creams, and over-the-counter medicines.  Any problems you or family members have had with anesthetic medicines.  Any blood disorders you have.  Any surgeries you have had.  Any medical conditions you have.  Whether you are pregnant or may be pregnant. What are the risks? Generally, this is a safe treatment. However, problems may occur, including:  Infection.  Bleeding.  Allergic reactions to medicines.  Damage to the joint or tissues around the joint.  Thinning of skin or loss of skin color over the joint.  Temporary flushing of the face or chest.  Temporary increase in pain.  Temporary increase in blood sugar.  Failure to relieve inflammation or pain. What  happens before the treatment?  You may have imaging tests of your joint.  Ask your health care provider about: ? Changing or stopping your regular medicines. This is especially important if you are taking diabetes medicines or blood thinners. ? Taking medicines such as aspirin and ibuprofen. These medicines can thin your blood. Do not take these medicines unless your health care provider tells you to take them. ? Taking over-the-counter medicines, vitamins, herbs, and supplements.  Ask your health care provider if you can drive yourself home after the procedure. What happens during the treatment?   Your health care provider will position you for the injection and locate the injection site over your joint.  The skin over the joint will be cleaned with a germ-killing soap.  Your health care provider may: ? Spray a numbing solution (topical anesthetic) over the injection site. ? Inject a local anesthetic under the skin above your joint.  The needle will be placed through your skin into your joint. Your health care provider may use imaging to guide the needle to the right spot for the injection. If imaging is used, a special contrast dye may be injected to confirm that the needle is in the correct location.  The steroid medicine will be injected into your joint.  Anesthetic may be injected along with the steroid. This may be a medicine that relieves pain for a short time (short-acting anesthetic) or for a longer time (long-acting anesthetic).  The needle will be removed, and an adhesive bandage (dressing) will be placed over the injection site. The procedure may vary among health care providers and hospitals. What can I   expect after the treatment?  You will be able to go home after the treatment.  It is normal to feel slight flushing for a few days after the injection.  After the treatment, it is common to have an increase in joint pain after the anesthetic has worn off. This may  happen about an hour after a short-acting anesthetic or about 8 hours after a longer-acting anesthetic.  You should begin to feel relief from joint pain and swelling after 24 to 48 hours. Follow these instructions at home: Injection site care  Leave the adhesive dressing over your injection site in place until your health care provider says you can remove it.  Check your injection site every day for signs of infection. Check for: ? Redness, swelling, or pain. ? Fluid or blood. ? Warmth. ? Pus or a bad smell. Activity  Return to your normal activities as told by your health care provider. Ask your health care provider what activities are safe for you. You may be asked to limit activities that put stress on the joint for a few days.  Do joint exercises as told by your health care provider.  Do not take baths, swim, or use a hot tub until your health care provider approves. Managing pain, stiffness, and swelling   If directed, put ice on the joint. ? Put ice in a plastic bag. ? Place a towel between your skin and the bag. ? Leave the ice on for 20 minutes, 2-3 times a day.  Raise (elevate) your joint above the level of your heart when you are sitting or lying down. General instructions  Take over-the-counter and prescription medicines only as told by your health care provider.  Do not use any products that contain nicotine or tobacco, such as cigarettes, e-cigarettes, and chewing tobacco. These can delay joint healing. If you need help quitting, ask your health care provider.  If you have diabetes, be aware that your blood sugar may be slightly elevated for several days after the injection.  Keep all follow-up visits as told by your health care provider. This is important. Contact a health care provider if you have:  Chills or a fever.  Any signs of infection at your injection site.  Increased pain or swelling or no relief after 2 days. Summary  A joint steroid injection  is a treatment to relieve pain and swelling in a joint.  Steroids are medicines that reduce inflammation. Your health care provider may add an anesthetic along with the steroid.  You may have joint steroid injections as part of your arthritis treatment.  Joint steroid injections may be repeated, but having them too often can damage a joint or the skin over the joint.  Contact your health care provider if you have a fever, chills, or signs of infection or if you get no relief from joint pain or swelling. This information is not intended to replace advice given to you by your health care provider. Make sure you discuss any questions you have with your health care provider. Document Released: 06/07/2018 Document Revised: 06/07/2018 Document Reviewed: 06/07/2018 Elsevier Patient Education  2020 [employer]  

## 2019-06-06 NOTE — Progress Notes (Signed)
   Subjective:    Patient ID: Timothy Duran, male    DOB: January 22, 1946, 73 y.o.   MRN: [id number]   Chief Complaint: bilateral knee pain (3 -4 days)   HPI Patient come sin today c/o bil knee pain. Started over 6 onths ago and has gradually become unbearable. Rates pian 8/10. Walking increases pain.pain is worse in the back of both knees.   Review of Systems  Constitutional: Negative for activity change and appetite change.  HENT: Negative.   Eyes: Negative for pain.  Respiratory: Negative for shortness of breath.   Cardiovascular: Negative for chest pain, palpitations and leg swelling.  Gastrointestinal: Negative for abdominal pain.  Endocrine: Negative for polydipsia.  Genitourinary: Negative.   Musculoskeletal: Positive for arthralgias (bil knees).  Skin: Negative for rash.  Neurological: Negative for dizziness, weakness and headaches.  Hematological: Does not bruise/bleed easily.  Psychiatric/Behavioral: Negative.   All other systems reviewed and are negative.       Objective:   Physical Exam Vitals signs and nursing note reviewed.  Constitutional:      Appearance: Normal appearance.  Cardiovascular:     Rate and Rhythm: Regular rhythm.     Heart sounds: Normal heart sounds.  Musculoskeletal:     Comments: Mild right knee effusion Mild left knee effusion All ligamante intact bil Crepitus on flexion and extension bil   Skin:    General: Skin is warm.  Neurological:     General: No focal deficit present.     Mental Status: He is alert and oriented to person, place, and time.    BP 133/78   Pulse 84   Temp 98.9 F (37.2 C) (Oral)   Ht 5\' 8"  (1.727 m)   Wt 218 lb (98.9 kg)   BMI 33.15 kg/m   Joint Injection/Arthrocentesis  Date/Time: 06/06/2019 4:13 PM Performed by: Chevis Pretty, FNP Authorized by: Hassell Done Mary-Margaret, FNP  Indications: joint swelling and pain  Body area: knee Joint: left knee Local anesthesia used: no  Anesthesia: Local  anesthesia used: no  Sedation: Patient sedated: no  Needle size: 20 G Ultrasound guidance: no Approach: anterior Methylprednisolone amount: 40 mg Bupivacaine 0.25% amount: 1 mL Patient tolerance: patient tolerated the procedure well with no immediate complications Comments: Both knees injected in same manner( computer will not let documentation for 2 procedure blocks.    bil knee xray- mild degeneratuve changes with light joint space narrowing-Preliminary reading by Ronnald Collum, FNP  [facility]      Assessment & Plan:  Timothy Duran in today with chief complaint of bilateral knee pain (3 -4 days)   1. Acute pain of both knees bil knee injection Ice Elevate when sitting  - DG Knee 1-2 Views Left; Future - DG Knee 1-2 Views Right; Future - methylPREDNISolone acetate (DEPO-MEDROL) injection 40 mg - bupivacaine (MARCAINE) 0.25 % (with pres) injection 1 mL - methylPREDNISolone acetate (DEPO-MEDROL) injection 40 mg - bupivacaine (MARCAINE) 0.25 % (with pres) injection 1 mL  Mary-Margaret Hassell Done, FNP

## 2019-06-08 ENCOUNTER — Ambulatory Visit: Payer: Medicare Other | Admitting: Nurse Practitioner

## 2019-06-29 ENCOUNTER — Other Ambulatory Visit: Payer: Self-pay | Admitting: Nurse Practitioner

## 2019-06-29 DIAGNOSIS — R42 Dizziness and giddiness: Secondary | ICD-10-CM

## 2019-09-26 ENCOUNTER — Telehealth: Payer: Self-pay

## 2019-09-26 MED ORDER — PREDNISONE 20 MG PO TABS: ORAL_TABLET | ORAL | 0 refills | Status: DC

## 2019-09-26 NOTE — Telephone Encounter (Signed)
Patient aware.

## 2019-09-26 NOTE — Telephone Encounter (Signed)
Patient has cough, congestion, and rash on chest , legs, back . Itching all over. Has been there about a week.

## 2019-09-26 NOTE — Telephone Encounter (Signed)
Prednisone snt to pharmacy for rash- waiting on covid testing and could not come into office. Meds ordered this encounter  Medications  . predniSONE (DELTASONE) 20 MG tablet    Sig: 2 po at sametime daily for 5 days    Dispense:  10 tablet    Refill:  0    Order Specific Question:   Supervising Provider    Answer:   Caryl Pina A [id number]

## 2019-10-18 ENCOUNTER — Ambulatory Visit: Payer: Medicare Other | Admitting: Nurse Practitioner

## 2020-01-04 ENCOUNTER — Telehealth: Payer: Self-pay | Admitting: Nurse Practitioner

## 2020-03-22 DIAGNOSIS — C61 Malignant neoplasm of prostate: Secondary | ICD-10-CM | POA: Diagnosis not present

## 2020-03-22 DIAGNOSIS — N200 Calculus of kidney: Secondary | ICD-10-CM | POA: Diagnosis not present

## 2020-03-26 ENCOUNTER — Other Ambulatory Visit: Payer: Self-pay | Admitting: Nurse Practitioner

## 2020-03-26 DIAGNOSIS — I10 Essential (primary) hypertension: Secondary | ICD-10-CM

## 2020-04-26 ENCOUNTER — Telehealth: Payer: Self-pay | Admitting: Nurse Practitioner

## 2020-04-26 NOTE — Telephone Encounter (Signed)
Can either do an evisit or has to be seen

## 2020-04-26 NOTE — Telephone Encounter (Signed)
Left detailed message on patients voicemail that we can do a telephone visit on Monday and advised them to call office to schedule

## 2020-04-26 NOTE — Telephone Encounter (Signed)
Requesting medication because of tick bites.

## 2020-05-19 ENCOUNTER — Other Ambulatory Visit: Payer: Self-pay | Admitting: Nurse Practitioner

## 2020-05-19 DIAGNOSIS — G459 Transient cerebral ischemic attack, unspecified: Secondary | ICD-10-CM

## 2020-06-05 ENCOUNTER — Other Ambulatory Visit: Payer: Self-pay | Admitting: Nurse Practitioner

## 2020-06-05 DIAGNOSIS — I10 Essential (primary) hypertension: Secondary | ICD-10-CM

## 2020-06-05 NOTE — Telephone Encounter (Signed)
MMM NTBS 30 days given 03/26/20

## 2020-06-06 MED ORDER — OLMESARTAN MEDOXOMIL-HCTZ 20-12.5 MG PO TABS: 1.0000 | ORAL_TABLET | Freq: Every day | ORAL | 0 refills | Status: DC

## 2020-06-06 NOTE — Addendum Note (Signed)
Addended by: Karle Plumber on: 06/06/2020 04:30 PM   Modules accepted: Orders

## 2020-06-06 NOTE — Telephone Encounter (Signed)
Appointment scheduled and 30 day given.

## 2020-06-14 ENCOUNTER — Other Ambulatory Visit: Payer: Self-pay | Admitting: Nurse Practitioner

## 2020-06-14 DIAGNOSIS — I10 Essential (primary) hypertension: Secondary | ICD-10-CM

## 2020-06-22 ENCOUNTER — Other Ambulatory Visit: Payer: Self-pay | Admitting: Nurse Practitioner

## 2020-06-22 DIAGNOSIS — G459 Transient cerebral ischemic attack, unspecified: Secondary | ICD-10-CM

## 2020-06-25 ENCOUNTER — Encounter: Payer: Self-pay | Admitting: Nurse Practitioner

## 2020-06-25 ENCOUNTER — Ambulatory Visit (INDEPENDENT_AMBULATORY_CARE_PROVIDER_SITE_OTHER): Payer: Medicare PPO | Admitting: Nurse Practitioner

## 2020-06-25 ENCOUNTER — Other Ambulatory Visit: Payer: Self-pay

## 2020-06-25 VITALS — BP 103/66 | HR 67 | Temp 98.2°F | Resp 20 | Ht 68.0 in | Wt 168.0 lb

## 2020-06-25 DIAGNOSIS — E782 Mixed hyperlipidemia: Secondary | ICD-10-CM

## 2020-06-25 DIAGNOSIS — I10 Essential (primary) hypertension: Secondary | ICD-10-CM | POA: Diagnosis not present

## 2020-06-25 DIAGNOSIS — G459 Transient cerebral ischemic attack, unspecified: Secondary | ICD-10-CM

## 2020-06-25 DIAGNOSIS — K219 Gastro-esophageal reflux disease without esophagitis: Secondary | ICD-10-CM | POA: Diagnosis not present

## 2020-06-25 DIAGNOSIS — Z6825 Body mass index (BMI) 25.0-25.9, adult: Secondary | ICD-10-CM

## 2020-06-25 DIAGNOSIS — R42 Dizziness and giddiness: Secondary | ICD-10-CM

## 2020-06-25 MED ORDER — CLOPIDOGREL BISULFATE 75 MG PO TABS: 75.0000 mg | ORAL_TABLET | Freq: Every day | ORAL | 0 refills | Status: DC

## 2020-06-25 MED ORDER — OLMESARTAN MEDOXOMIL-HCTZ 20-12.5 MG PO TABS: 1.0000 | ORAL_TABLET | Freq: Every day | ORAL | 0 refills | Status: DC

## 2020-06-25 NOTE — Progress Notes (Signed)
Subjective:    Patient ID: Timothy Duran, male    DOB: [birth year], 74 y.o.   MRN: [id number]   Chief Complaint: Medical Management of Chronic Issues    HPI:  1. Essential hypertension No c/o chest pain, sob or headache. Does not check blood pressure at home. BP Readings from Last 3 Encounters:  06/06/19 133/78  08/24/18 118/75  01/28/18 127/87     2. Mixed hyperlipidemia Does not watch diet and does no dedicated exercise. He does stay very active though. Lab Results  Component Value Date   CHOL 233 (H) 08/24/2018   HDL 41 08/24/2018   LDLCALC 149 (H) 08/24/2018   TRIG 213 (H) 08/24/2018   CHOLHDL 5.7 (H) 08/24/2018     3. Gastroesophageal reflux disease without esophagitis Is currently on no prescription medication. Has occasional symptoms that he will use OTC meds for.  4. TIA (transient ischemic attack) No recent episodes that he is aware of  5. Vertigo No recent issues  6. BMI 28.0-28.9,adult Weight is down 50lbs Wt Readings from Last 3 Encounters:  06/25/20 168 lb (76.2 kg)  06/06/19 218 lb (98.9 kg)  08/24/18 217 lb (98.4 kg)   BMI Readings from Last 3 Encounters:  06/25/20 25.54 kg/m  06/06/19 33.15 kg/m  08/24/18 32.99 kg/m       Outpatient Encounter Medications as of 06/25/2020  Medication Sig  . clopidogrel (PLAVIX) 75 MG tablet Take 1 tablet (75 mg total) by mouth daily.  . meclizine (ANTIVERT) 25 MG tablet TAKE 1 TABLET BY MOUTH THREE TIMES DAILY AS NEEDED FOR DIZZINESS  . olmesartan-hydrochlorothiazide (BENICAR HCT) 20-12.5 MG tablet Take 1 tablet by mouth daily.  . predniSONE (DELTASONE) 20 MG tablet 2 po at sametime daily for 5 days  . propranolol (INDERAL) 20 MG tablet Take 1 tablet (20 mg total) by mouth 2 (two) times daily. (Needs to be seen before next refill)     Past Surgical History:  Procedure Laterality Date  . BASAL CELL CARCINOMA EXCISION Left 2015   "neck"  . BLADDER STONE REMOVAL  09/2009  . [place]   "got one stone; still has one lodge in there" (05/20/2015)  . INCISION AND DRAINAGE ABSCESS Left 06/20/2015   Procedure: INCISION AND DRAINAGE ABSCESS;  Surgeon: Melina Schools, MD;  Location: [facility];  Service: Orthopedics;  Laterality: Left;  . [place]  . KNEE ARTHROTOMY Left 07/20/2015   Procedure: KNEE ARTHROTOMY Incision and Drainage left knee;  Surgeon: Paralee Cancel, MD;  Location: [facility];  Service: Orthopedics;  Laterality: Left;  . NASAL ENDOSCOPY WITH EPISTAXIS CONTROL Left 05/21/2015   Procedure: NASAL ENDOSCOPY WITH EPISTAXIS CONTROL;  Surgeon: Melissa Montane, MD;  Location: [facility];  Service: ENT;  Laterality: Left;  . PROSTATECTOMY  01/2010  . SHOULDER SURGERY  11/2015   torn ligament  . VIDEO ASSISTED THORACOSCOPY (VATS)/WEDGE RESECTION Right 10/2009   "upper; benign"    Family History  Problem Relation Age of Onset  . Dementia Mother   . Prostate cancer Father        eye, lung, prostate  . Prostate cancer Brother   . Colon cancer Sister     New complaints: None today  Social history: Lives with his wife  Controlled substance contract: n/a     Review of Systems  Constitutional: Negative for diaphoresis.  Eyes: Negative for pain.  Respiratory: Negative for shortness of breath.   Cardiovascular: Negative for chest pain, palpitations  and leg swelling.  Gastrointestinal: Negative for abdominal pain.  Endocrine: Negative for polydipsia.  Skin: Negative for rash.  Neurological: Negative for dizziness, weakness and headaches.  Hematological: Does not bruise/bleed easily.  All other systems reviewed and are negative.      Objective:   Physical Exam Vitals and nursing note reviewed.  Constitutional:      Appearance: Normal appearance. He is well-developed.  HENT:     Head: Normocephalic.     Nose: Nose normal.  Eyes:     Pupils: Pupils are equal, round, and reactive to light.  Neck:     Thyroid: No thyroid mass or thyromegaly.      Vascular: No carotid bruit or JVD.     Trachea: Phonation normal.  Cardiovascular:     Rate and Rhythm: Normal rate and regular rhythm.  Pulmonary:     Effort: Pulmonary effort is normal. No respiratory distress.     Breath sounds: Normal breath sounds.  Abdominal:     General: Bowel sounds are normal.     Palpations: Abdomen is soft.     Tenderness: There is no abdominal tenderness.     Hernia: A hernia (large nontender ventral hernia) is present.  Musculoskeletal:        General: Normal range of motion.     Cervical back: Normal range of motion and neck supple.  Lymphadenopathy:     Cervical: No cervical adenopathy.  Skin:    General: Skin is warm and dry.  Neurological:     Mental Status: He is alert and oriented to person, place, and time.  Psychiatric:        Behavior: Behavior normal.        Thought Content: Thought content normal.        Judgment: Judgment normal.     BP 103/66   Pulse 67   Temp 98.2 F (36.8 C) (Temporal)   Resp 20   Ht 5' 8" (1.727 m)   Wt 168 lb (76.2 kg)   SpO2 94%   BMI 25.54 kg/m       Assessment & Plan:  Timothy Duran comes in today with chief complaint of Medical Management of Chronic Issues   Diagnosis and orders addressed:  1. Essential hypertension Low sodium diet - olmesartan-hydrochlorothiazide (BENICAR HCT) 20-12.5 MG tablet; Take 1 tablet by mouth daily.  Dispense: 30 tablet; Refill: 0 - CBC with Differential/Platelet - CMP14+EGFR  2. Mixed hyperlipidemia Low fat diet - Lipid panel  3. Gastroesophageal reflux disease without esophagitis Avoid spicy foods Do not eat 2 hours prior to bedtime   4. TIA (transient ischemic attack) - clopidogrel (PLAVIX) 75 MG tablet; Take 1 tablet (75 mg total) by mouth daily.  Dispense: 30 tablet; Refill: 0  5. Vertigo Report symptoms  6. BMI 25.0-25.9,adult Discussed diet and exercise for person with BMI >25 Will recheck weight in 3-6 months   Labs pending Health  Maintenance reviewed Diet and exercise encouraged  Follow up plan: 6 months   Mary-Margaret Hassell Done, FNP

## 2020-06-25 NOTE — Patient Instructions (Signed)
Exercising to Stay Healthy To become healthy and stay healthy, it is recommended that you do moderate-intensity and vigorous-intensity exercise. You can tell that you are exercising at a moderate intensity if your heart starts beating faster and you start breathing faster but can still hold a conversation. You can tell that you are exercising at a vigorous intensity if you are breathing much harder and faster and cannot hold a conversation while exercising. Exercising regularly is important. It has many health benefits, such as:  Improving overall fitness, flexibility, and endurance.  Increasing bone density.  Helping with weight control.  Decreasing body fat.  Increasing muscle strength.  Reducing stress and tension.  Improving overall health. How often should I exercise? Choose an activity that you enjoy, and set realistic goals. Your health care provider can help you make an activity plan that works for you. Exercise regularly as told by your health care provider. This may include:  Doing strength training two times a week, such as: ? Lifting weights. ? Using resistance bands. ? Push-ups. ? Sit-ups. ? Yoga.  Doing a certain intensity of exercise for a given amount of time. Choose from these options: ? A total of 150 minutes of moderate-intensity exercise every week. ? A total of 75 minutes of vigorous-intensity exercise every week. ? A mix of moderate-intensity and vigorous-intensity exercise every week. Children, pregnant women, people who have not exercised regularly, people who are overweight, and older adults may need to talk with a health care provider about what activities are safe to do. If you have a medical condition, be sure to talk with your health care provider before you start a new exercise program. What are some exercise ideas? Moderate-intensity exercise ideas include:  Walking 1 mile (1.6 km) in about 15  minutes.  Biking.  Hiking.  Golfing.  Dancing.  Water aerobics. Vigorous-intensity exercise ideas include:  Walking 4.5 miles (7.2 km) or more in about 1 hour.  Jogging or running 5 miles (8 km) in about 1 hour.  Biking 10 miles (16.1 km) or more in about 1 hour.  Lap swimming.  Roller-skating or in-line skating.  Cross-country skiing.  Vigorous competitive sports, such as football, basketball, and soccer.  Jumping rope.  Aerobic dancing. What are some everyday activities that can help me to get exercise?  Yard work, such as: ? Pushing a lawn mower. ? Raking and bagging leaves.  Washing your car.  Pushing a stroller.  Shoveling snow.  Gardening.  Washing windows or floors. How can I be more active in my day-to-day activities?  Use stairs instead of an elevator.  Take a walk during your lunch break.  If you drive, park your car farther away from your work or school.  If you take public transportation, get off one stop early and walk the rest of the way.  Stand up or walk around during all of your indoor phone calls.  Get up, stretch, and walk around every 30 minutes throughout the day.  Enjoy exercise with a friend. Support to continue exercising will help you keep a regular routine of activity. What guidelines can I follow while exercising?  Before you start a new exercise program, talk with your health care provider.  Do not exercise so much that you hurt yourself, feel dizzy, or get very short of breath.  Wear comfortable clothes and wear shoes with good support.  Drink plenty of water while you exercise to prevent dehydration or heat stroke.  Work out until your breathing   and your heartbeat get faster. Where to find more information  [place] Department of Health and Human Services: www.hhs.gov  Centers for Disease Control and Prevention (CDC): www.cdc.gov Summary  Exercising regularly is important. It will improve your overall fitness,  flexibility, and endurance.  Regular exercise also will improve your overall health. It can help you control your weight, reduce stress, and improve your bone density.  Do not exercise so much that you hurt yourself, feel dizzy, or get very short of breath.  Before you start a new exercise program, talk with your health care provider. This information is not intended to replace advice given to you by your health care provider. Make sure you discuss any questions you have with your health care provider. Document Revised: 09/17/2017 Document Reviewed: 08/26/2017 Elsevier Patient Education  2020 Elsevier Inc.  

## 2020-08-22 ENCOUNTER — Other Ambulatory Visit: Payer: Self-pay | Admitting: Nurse Practitioner

## 2020-08-22 DIAGNOSIS — G459 Transient cerebral ischemic attack, unspecified: Secondary | ICD-10-CM

## 2020-08-25 ENCOUNTER — Other Ambulatory Visit: Payer: Self-pay | Admitting: Nurse Practitioner

## 2020-08-25 DIAGNOSIS — G459 Transient cerebral ischemic attack, unspecified: Secondary | ICD-10-CM

## 2020-09-09 ENCOUNTER — Telehealth: Payer: Self-pay | Admitting: *Deleted

## 2020-09-09 MED ORDER — TRIAMCINOLONE ACETONIDE 0.1 % EX CREA: 1.0000 "application " | TOPICAL_CREAM | Freq: Two times a day (BID) | CUTANEOUS | 0 refills | Status: DC

## 2020-09-09 NOTE — Addendum Note (Signed)
Addended by: Chevis Pretty on: 09/09/2020 04:46 PM   Modules accepted: Orders

## 2020-09-09 NOTE — Telephone Encounter (Signed)
Triamcinolone cream sent to pharmacy.

## 2020-09-09 NOTE — Telephone Encounter (Signed)
Fax from [facility] 0.1% cream Under reconciled meds  Please advise

## 2020-10-31 ENCOUNTER — Other Ambulatory Visit: Payer: Self-pay | Admitting: Nurse Practitioner

## 2020-10-31 DIAGNOSIS — I10 Essential (primary) hypertension: Secondary | ICD-10-CM

## 2020-11-22 ENCOUNTER — Other Ambulatory Visit: Payer: Self-pay | Admitting: Nurse Practitioner

## 2020-11-22 DIAGNOSIS — G459 Transient cerebral ischemic attack, unspecified: Secondary | ICD-10-CM

## 2020-12-23 ENCOUNTER — Ambulatory Visit (INDEPENDENT_AMBULATORY_CARE_PROVIDER_SITE_OTHER): Payer: Medicare PPO | Admitting: Nurse Practitioner

## 2020-12-23 ENCOUNTER — Encounter: Payer: Self-pay | Admitting: Nurse Practitioner

## 2020-12-23 ENCOUNTER — Other Ambulatory Visit: Payer: Self-pay

## 2020-12-23 VITALS — BP 109/70 | HR 65 | Temp 98.4°F | Resp 20 | Ht 68.0 in | Wt 172.0 lb

## 2020-12-23 DIAGNOSIS — R42 Dizziness and giddiness: Secondary | ICD-10-CM

## 2020-12-23 DIAGNOSIS — I10 Essential (primary) hypertension: Secondary | ICD-10-CM | POA: Diagnosis not present

## 2020-12-23 DIAGNOSIS — G459 Transient cerebral ischemic attack, unspecified: Secondary | ICD-10-CM | POA: Diagnosis not present

## 2020-12-23 DIAGNOSIS — E782 Mixed hyperlipidemia: Secondary | ICD-10-CM | POA: Diagnosis not present

## 2020-12-23 DIAGNOSIS — K219 Gastro-esophageal reflux disease without esophagitis: Secondary | ICD-10-CM | POA: Diagnosis not present

## 2020-12-23 DIAGNOSIS — Z6828 Body mass index (BMI) 28.0-28.9, adult: Secondary | ICD-10-CM

## 2020-12-23 MED ORDER — OLMESARTAN MEDOXOMIL-HCTZ 20-12.5 MG PO TABS: 1.0000 | ORAL_TABLET | Freq: Every day | ORAL | 1 refills | Status: DC

## 2020-12-23 MED ORDER — CLOPIDOGREL BISULFATE 75 MG PO TABS
75.0000 mg | ORAL_TABLET | Freq: Every day | ORAL | 1 refills | Status: DC
Start: 2020-12-23 — End: 2021-06-09

## 2020-12-23 NOTE — Progress Notes (Addendum)
Subjective:    Patient ID: Timothy Duran, male    DOB: 07-07-1946, 75 y.o.   MRN: [id number]   Chief Complaint: medical management of chronic issues     HPI:  1. Primary hypertension No c/o chest pain, sob or headache. Does not check blood pressure at home. BP Readings from Last 3 Encounters:  06/25/20 103/66  06/06/19 133/78  08/24/18 118/75     2. Mixed hyperlipidemia Does not watch diet and does no exercise. He does try to stay very active. Lab Results  Component Value Date   CHOL 233 (H) 08/24/2018   HDL 41 08/24/2018   LDLCALC 149 (H) 08/24/2018   TRIG 213 (H) 08/24/2018   CHOLHDL 5.7 (H) 08/24/2018   The 10-year ASCVD risk score Mikey Bussing DC Jr., et al., 2013) is: 25.9%   3. TIA (transient ischemic attack) Has had no episodes in a few years that he is aware of. He still continues to take his plavix with no side effects.  4. Gastroesophageal reflux disease without esophagitis He only has occasional gastric reflux.  5. Vertigo Has had no vertigo in over 6 months.  6. BMI 28.0-28.9,adult No recent weight changes Wt Readings from Last 3 Encounters:  12/23/20 172 lb (78 kg)  06/25/20 168 lb (76.2 kg)  06/06/19 218 lb (98.9 kg)   BMI Readings from Last 3 Encounters:  12/23/20 26.15 kg/m  06/25/20 25.54 kg/m  06/06/19 33.15 kg/m     Outpatient Encounter Medications as of 12/23/2020  Medication Sig  . clopidogrel (PLAVIX) 75 MG tablet Take 1 tablet by mouth once daily  . olmesartan-hydrochlorothiazide (BENICAR HCT) 20-12.5 MG tablet Take 1 tablet by mouth once daily  . triamcinolone (KENALOG) 0.1 % Apply 1 application topically 2 (two) times daily.    Past Surgical History:  Procedure Laterality Date  . BASAL CELL CARCINOMA EXCISION Left 2015   "neck"  . BLADDER STONE REMOVAL  09/2009  . [place]   "got one stone; still has one lodge in there" (05/20/2015)  . INCISION AND DRAINAGE ABSCESS Left 06/20/2015   Procedure: INCISION  AND DRAINAGE ABSCESS;  Surgeon: Melina Schools, MD;  Location: [facility];  Service: Orthopedics;  Laterality: Left;  . [place]  . KNEE ARTHROTOMY Left 07/20/2015   Procedure: KNEE ARTHROTOMY Incision and Drainage left knee;  Surgeon: Paralee Cancel, MD;  Location: [facility];  Service: Orthopedics;  Laterality: Left;  . NASAL ENDOSCOPY WITH EPISTAXIS CONTROL Left 05/21/2015   Procedure: NASAL ENDOSCOPY WITH EPISTAXIS CONTROL;  Surgeon: Melissa Montane, MD;  Location: [facility];  Service: ENT;  Laterality: Left;  . PROSTATECTOMY  01/2010  . SHOULDER SURGERY  11/2015   torn ligament  . VIDEO ASSISTED THORACOSCOPY (VATS)/WEDGE RESECTION Right 10/2009   "upper; benign"    Family History  Problem Relation Age of Onset  . Dementia Mother   . Prostate cancer Father        eye, lung, prostate  . Prostate cancer Brother   . Colon cancer Sister     New complaints: None today  Social history: Lives with his wife  Controlled substance contract: n/a    Review of Systems  Constitutional: Negative for diaphoresis.  Eyes: Negative for pain.  Respiratory: Negative for shortness of breath.   Cardiovascular: Negative for chest pain, palpitations and leg swelling.  Gastrointestinal: Negative for abdominal pain.  Endocrine: Negative for polydipsia.  Skin: Negative for rash.  Neurological: Negative for dizziness, weakness and  headaches.  Hematological: Does not bruise/bleed easily.  All other systems reviewed and are negative.      Objective:   Physical Exam Vitals and nursing note reviewed.  Constitutional:      Appearance: Normal appearance. He is well-developed and well-nourished.  HENT:     Head: Normocephalic.     Right Ear: There is impacted cerumen.     Left Ear: There is impacted cerumen.     Nose: Nose normal.     Mouth/Throat:     Mouth: Oropharynx is clear and moist.  Eyes:     Extraocular Movements: EOM normal.     Pupils: Pupils are equal, round, and reactive to light.   Neck:     Thyroid: No thyroid mass or thyromegaly.     Vascular: No carotid bruit or JVD.     Trachea: Phonation normal.  Cardiovascular:     Rate and Rhythm: Normal rate and regular rhythm.  Pulmonary:     Effort: Pulmonary effort is normal. No respiratory distress.     Breath sounds: Normal breath sounds.  Abdominal:     General: Bowel sounds are normal. Aorta is normal.     Palpations: Abdomen is soft.     Tenderness: There is no abdominal tenderness.     Hernia: A hernia (mid abdominal wall hernia) is present.  Musculoskeletal:        General: Normal range of motion.     Cervical back: Normal range of motion and neck supple.  Lymphadenopathy:     Cervical: No cervical adenopathy.  Skin:    General: Skin is warm and dry.  Neurological:     Mental Status: He is alert and oriented to person, place, and time.  Psychiatric:        Mood and Affect: Mood and affect normal.        Behavior: Behavior normal.        Thought Content: Thought content normal.        Judgment: Judgment normal.     BP 109/70   Pulse 65   Temp 98.4 F (36.9 C) (Temporal)   Resp 20   Ht '5\' 8"'  (1.727 m)   Wt 172 lb (78 kg)   SpO2 96%   BMI 26.15 kg/m   S/P bil ear irrigation-TM's clear bil     Assessment & Plan:  Timothy Duran comes in today with chief complaint of Medical Management of Chronic Issues   Diagnosis and orders addressed:  1. Primary hypertension Low sodium ediet - olmesartan-hydrochlorothiazide (BENICAR HCT) 20-12.5 MG tablet; Take 1 tablet by mouth daily.  Dispense: 90 tablet; Refill: 1 - CBC with Differential/Platelet - CMP14+EGFR  2. Mixed hyperlipidemia Low fat diet - Lipid panel  3. TIA (transient ischemic attack) - clopidogrel (PLAVIX) 75 MG tablet; Take 1 tablet (75 mg total) by mouth daily.  Dispense: 90 tablet; Refill: 1  4. Gastroesophageal reflux disease without esophagitis Avoid spicy foods Do not eat 2 hours prior to bedtime  5. Vertigo  6. BMI  28.0-28.9,adult Discussed diet and exercise for person with BMI >25 Will recheck weight in 3-6 months  7 cerumen impaction Debrox 2-3 x a week.  Labs pending Health Maintenance reviewed Diet and exercise encouraged  Follow up plan: 6 months   Mary-Margaret Hassell Done, FNP

## 2020-12-23 NOTE — Patient Instructions (Signed)
Exercising to Stay Healthy To become healthy and stay healthy, it is recommended that you do moderate-intensity and vigorous-intensity exercise. You can tell that you are exercising at a moderate intensity if your heart starts beating faster and you start breathing faster but can still hold a conversation. You can tell that you are exercising at a vigorous intensity if you are breathing much harder and faster and cannot hold a conversation while exercising. Exercising regularly is important. It has many health benefits, such as:  Improving overall fitness, flexibility, and endurance.  Increasing bone density.  Helping with weight control.  Decreasing body fat.  Increasing muscle strength.  Reducing stress and tension.  Improving overall health. How often should I exercise? Choose an activity that you enjoy, and set realistic goals. Your health care provider can help you make an activity plan that works for you. Exercise regularly as told by your health care provider. This may include:  Doing strength training two times a week, such as: ? Lifting weights. ? Using resistance bands. ? Push-ups. ? Sit-ups. ? Yoga.  Doing a certain intensity of exercise for a given amount of time. Choose from these options: ? A total of 150 minutes of moderate-intensity exercise every week. ? A total of 75 minutes of vigorous-intensity exercise every week. ? A mix of moderate-intensity and vigorous-intensity exercise every week. Children, pregnant women, people who have not exercised regularly, people who are overweight, and older adults may need to talk with a health care provider about what activities are safe to do. If you have a medical condition, be sure to talk with your health care provider before you start a new exercise program. What are some exercise ideas? Moderate-intensity exercise ideas include:  Walking 1 mile (1.6 km) in about 15  minutes.  Biking.  Hiking.  Golfing.  Dancing.  Water aerobics. Vigorous-intensity exercise ideas include:  Walking 4.5 miles (7.2 km) or more in about 1 hour.  Jogging or running 5 miles (8 km) in about 1 hour.  Biking 10 miles (16.1 km) or more in about 1 hour.  Lap swimming.  Roller-skating or in-line skating.  Cross-country skiing.  Vigorous competitive sports, such as football, basketball, and soccer.  Jumping rope.  Aerobic dancing.   What are some everyday activities that can help me to get exercise?  Yard work, such as: ? Pushing a lawn mower. ? Raking and bagging leaves.  Washing your car.  Pushing a stroller.  Shoveling snow.  Gardening.  Washing windows or floors. How can I be more active in my day-to-day activities?  Use stairs instead of an elevator.  Take a walk during your lunch break.  If you drive, park your car farther away from your work or school.  If you take public transportation, get off one stop early and walk the rest of the way.  Stand up or walk around during all of your indoor phone calls.  Get up, stretch, and walk around every 30 minutes throughout the day.  Enjoy exercise with a friend. Support to continue exercising will help you keep a regular routine of activity. What guidelines can I follow while exercising?  Before you start a new exercise program, talk with your health care provider.  Do not exercise so much that you hurt yourself, feel dizzy, or get very short of breath.  Wear comfortable clothes and wear shoes with good support.  Drink plenty of water while you exercise to prevent dehydration or heat stroke.  Work out until   your breathing and your heartbeat get faster. Where to find more information  [place] Department of Health and Human Services: www.hhs.gov  Centers for Disease Control and Prevention (CDC): www.cdc.gov Summary  Exercising regularly is important. It will improve your overall fitness,  flexibility, and endurance.  Regular exercise also will improve your overall health. It can help you control your weight, reduce stress, and improve your bone density.  Do not exercise so much that you hurt yourself, feel dizzy, or get very short of breath.  Before you start a new exercise program, talk with your health care provider. This information is not intended to replace advice given to you by your health care provider. Make sure you discuss any questions you have with your health care provider. Document Revised: 09/17/2017 Document Reviewed: 08/26/2017 Elsevier Patient Education  2021 Elsevier Inc.  

## 2020-12-24 LAB — CBC WITH DIFFERENTIAL/PLATELET
Basophils Absolute: 0.1 10*3/uL (ref 0.0–0.2)
Basos: 1 %
EOS (ABSOLUTE): 0.2 10*3/uL (ref 0.0–0.4)
Eos: 3 %
Hematocrit: 45.9 % (ref 37.5–51.0)
Hemoglobin: 16.1 g/dL (ref 13.0–17.7)
Immature Grans (Abs): 0 10*3/uL (ref 0.0–0.1)
Immature Granulocytes: 0 %
Lymphocytes Absolute: 1.7 10*3/uL (ref 0.7–3.1)
Lymphs: 24 %
MCH: 32.8 pg (ref 26.6–33.0)
MCHC: 35.1 g/dL (ref 31.5–35.7)
MCV: 94 fL (ref 79–97)
Monocytes Absolute: 0.8 10*3/uL (ref 0.1–0.9)
Monocytes: 11 %
Neutrophils Absolute: 4.4 10*3/uL (ref 1.4–7.0)
Neutrophils: 61 %
Platelets: 219 10*3/uL (ref 150–450)
RBC: 4.91 x10E6/uL (ref 4.14–5.80)
RDW: 13.1 % (ref 11.6–15.4)
WBC: 7.3 10*3/uL (ref 3.4–10.8)

## 2020-12-24 LAB — CMP14+EGFR
ALT: 16 IU/L (ref 0–44)
AST: 15 IU/L (ref 0–40)
Albumin/Globulin Ratio: 2.2 (ref 1.2–2.2)
Albumin: 4.3 g/dL (ref 3.7–4.7)
Alkaline Phosphatase: 91 IU/L (ref 44–121)
BUN/Creatinine Ratio: 20 (ref 10–24)
BUN: 18 mg/dL (ref 8–27)
Bilirubin Total: 0.3 mg/dL (ref 0.0–1.2)
CO2: 21 mmol/L (ref 20–29)
Calcium: 10.1 mg/dL (ref 8.6–10.2)
Chloride: 106 mmol/L (ref 96–106)
Creatinine, Ser: 0.9 mg/dL (ref 0.76–1.27)
Globulin, Total: 2 g/dL (ref 1.5–4.5)
Glucose: 98 mg/dL (ref 65–99)
Potassium: 4.2 mmol/L (ref 3.5–5.2)
Sodium: 141 mmol/L (ref 134–144)
Total Protein: 6.3 g/dL (ref 6.0–8.5)
eGFR: 89 mL/min/{1.73_m2} (ref >59)

## 2020-12-24 LAB — LIPID PANEL
Chol/HDL Ratio: 3.5 ratio (ref 0.0–5.0)
Cholesterol, Total: 184 mg/dL (ref 100–199)
HDL: 53 mg/dL (ref >39)
LDL Chol Calc (NIH): 114 mg/dL — ABNORMAL HIGH (ref 0–99)
Triglycerides: 92 mg/dL (ref 0–149)
VLDL Cholesterol Cal: 17 mg/dL (ref 5–40)

## 2021-01-30 ENCOUNTER — Encounter: Payer: Self-pay | Admitting: Family Medicine

## 2021-01-30 ENCOUNTER — Telehealth: Payer: Self-pay | Admitting: Nurse Practitioner

## 2021-01-30 ENCOUNTER — Ambulatory Visit (INDEPENDENT_AMBULATORY_CARE_PROVIDER_SITE_OTHER): Payer: Medicare PPO | Admitting: Family Medicine

## 2021-01-30 DIAGNOSIS — S40862A Insect bite (nonvenomous) of left upper arm, initial encounter: Secondary | ICD-10-CM

## 2021-01-30 DIAGNOSIS — R52 Pain, unspecified: Secondary | ICD-10-CM

## 2021-01-30 DIAGNOSIS — W57XXXA Bitten or stung by nonvenomous insect and other nonvenomous arthropods, initial encounter: Secondary | ICD-10-CM

## 2021-01-30 LAB — VERITOR FLU A/B WAIVED
Influenza A: NEGATIVE
Influenza B: NEGATIVE

## 2021-01-30 NOTE — Telephone Encounter (Signed)
Pt was seen this morning by Blanch Media and was tested for the flu, called to let us know he had removed a tick a few days ago and wanted to know if that could be the reason he is having chills and feels achy.

## 2021-01-30 NOTE — Progress Notes (Signed)
Virtual Visit via Telephone Note  I connected with Timothy Duran on 01/30/21 at 8:07 AM by telephone and verified that I am speaking with the correct person using two identifiers. Timothy Duran is currently located in his vehicle and his wife is currently with him during this visit. The provider, Loman Brooklyn, FNP is located in their office at time of visit.  I discussed the limitations, risks, security and privacy concerns of performing an evaluation and management service by telephone and the availability of in person appointments. I also discussed with the patient that there may be a patient responsible charge related to this service. The patient expressed understanding and agreed to proceed.  Subjective: PCP: Chevis Pretty, FNP  Chief Complaint  Patient presents with  . Generalized Body Aches   Patient complains of chills and body aches.  Onset of symptoms was 3 days ago, gradually improving since that time. He is drinking plenty of fluids. Evaluation to date: at home COVID test was negative. Treatment to date: Tylenol.  He does not smoke.    ROS: Per HPI  Current Outpatient Medications:  .  clopidogrel (PLAVIX) 75 MG tablet, Take 1 tablet (75 mg total) by mouth daily., Disp: 90 tablet, Rfl: 1 .  olmesartan-hydrochlorothiazide (BENICAR HCT) 20-12.5 MG tablet, Take 1 tablet by mouth daily., Disp: 90 tablet, Rfl: 1 .  triamcinolone (KENALOG) 0.1 %, Apply 1 application topically 2 (two) times daily., Disp: 453.6 g, Rfl: 0  Allergies  Allergen Reactions  . Other     Nasal packing- headaches and facial pain  . Penicillins     Blisters  Has patient had a PCN reaction causing immediate rash, facial/tongue/throat swelling, SOB or lightheadedness with hypotension: (Unknown} Has patient had a PCN reaction causing severe rash involving mucus membranes or skin necrosis: Unknown Has patient had a PCN reaction that required hospitalization No Has patient had a PCN reaction  occurring within the last 10 years: No If all of the above answers are "NO", then may proceed with Cephalosporin use.   Past Medical History:  Diagnosis Date  . Abdominal hernia    "in there now" (05/20/2015)  . Arthritis    "terrible; all over" (05/20/2015)  . Basal cell carcinoma    "left neck"  . Diverticulosis   . Epistaxis 05/20/2015   hospitalized  . Hypercholesterolemia   . Hypertension   . Kidney stone   . Prostate cancer [facility]) 2010   prostatectomy  . TIA (transient ischemic attack) 2005   ?    Observations/Objective: A&O  No respiratory distress or wheezing audible over the phone Mood, judgement, and thought processes all WNL  Assessment and Plan: 1. Body aches Patient reports this feels like the flu.  - Veritor Flu A/B Waived; Future   Follow Up Instructions:  I discussed the assessment and treatment plan with the patient. The patient was provided an opportunity to ask questions and all were answered. The patient agreed with the plan and demonstrated an understanding of the instructions.   The patient was advised to call back or seek an in-person evaluation if the symptoms worsen or if the condition fails to improve as anticipated.  The above assessment and management plan was discussed with the patient. The patient verbalized understanding of and has agreed to the management plan. Patient is aware to call the clinic if symptoms persist or worsen. Patient is aware when to return to the clinic for a follow-up visit. Patient educated on when it is  appropriate to go to the emergency department.   Time call ended: 8:18 AM  I provided 11 minutes of non-face-to-face time during this encounter.  Hendricks Limes, MSN, APRN, FNP-C [facility] 01/30/21

## 2021-01-31 NOTE — Telephone Encounter (Signed)
It is possible. I would monitor symptoms to see if they resolve or if he develops any further symptoms. Since our office is closed until Monday due to the Easter holiday, that will be a good time to follow-up with him.

## 2021-02-03 NOTE — Telephone Encounter (Signed)
Patient aware and asking if we can put in lab orders to be drawn for the tick bite it was 3-4 weeks ago. Blanch Media and Covering pcp abd regular pcp off. Is it okay to order for patient?

## 2021-02-03 NOTE — Telephone Encounter (Signed)
I placed lab orders for patient to come in and do.

## 2021-02-10 NOTE — Telephone Encounter (Signed)
Patient said he is feeling better and does not wish to have labs drawn now.  Will let us know if anything changes.

## 2021-03-07 DIAGNOSIS — N200 Calculus of kidney: Secondary | ICD-10-CM | POA: Diagnosis not present

## 2021-03-07 DIAGNOSIS — C61 Malignant neoplasm of prostate: Secondary | ICD-10-CM | POA: Diagnosis not present

## 2021-03-10 DIAGNOSIS — L03032 Cellulitis of left toe: Secondary | ICD-10-CM | POA: Diagnosis not present

## 2021-05-14 ENCOUNTER — Ambulatory Visit (INDEPENDENT_AMBULATORY_CARE_PROVIDER_SITE_OTHER): Payer: Medicare PPO

## 2021-05-14 VITALS — Ht 68.0 in | Wt 165.0 lb

## 2021-05-14 DIAGNOSIS — Z0000 Encounter for general adult medical examination without abnormal findings: Secondary | ICD-10-CM | POA: Diagnosis not present

## 2021-05-14 NOTE — Patient Instructions (Signed)
Timothy Duran , Thank you for taking time to come for your Medicare Wellness Visit. I appreciate your ongoing commitment to your health goals. Please review the following plan we discussed and let me know if I can assist you in the future.   Screening recommendations/referrals: Colonoscopy: Done 06/25/2016 - repeat not required Recommended yearly ophthalmology/optometry visit for glaucoma screening and checkup Recommended yearly dental visit for hygiene and checkup  Vaccinations: Influenza vaccine: Declined Pneumococcal vaccine: Declined Tdap vaccine: Declined Shingles vaccine: Declined   Covid-19: Declined  Advanced directives: Advance directive discussed with you today. Even though you declined this today, please call our office should you change your mind, and we can give you the proper paperwork for you to fill out.   Conditions/risks identified: Aim for 30 minutes of exercise or brisk walking each day, drink 6-8 glasses of water and eat lots of fruits and vegetables.   Next appointment: Follow up in one year for your annual wellness visit.   Preventive Care 8 Years and Older, Male  Preventive care refers to lifestyle choices and visits with your health care provider that can promote health and wellness. What does preventive care include? A yearly physical exam. This is also called an annual well check. Dental exams once or twice a year. Routine eye exams. Ask your health care provider how often you should have your eyes checked. Personal lifestyle choices, including: Daily care of your teeth and gums. Regular physical activity. Eating a healthy diet. Avoiding tobacco and drug use. Limiting alcohol use. Practicing safe sex. Taking low doses of aspirin every day. Taking vitamin and mineral supplements as recommended by your health care provider. What happens during an annual well check? The services and screenings done by your health care provider during your annual well check  will depend on your age, overall health, lifestyle risk factors, and family history of disease. Counseling  Your health care provider may ask you questions about your: Alcohol use. Tobacco use. Drug use. Emotional well-being. Home and relationship well-being. Sexual activity. Eating habits. History of falls. Memory and ability to understand (cognition). Work and work Statistician. Screening  You may have the following tests or measurements: Height, weight, and BMI. Blood pressure. Lipid and cholesterol levels. These may be checked every 5 years, or more frequently if you are over 70 years old. Skin check. Lung cancer screening. You may have this screening every year starting at age 28 if you have a 30-pack-year history of smoking and currently smoke or have quit within the past 15 years. Fecal occult blood test (FOBT) of the stool. You may have this test every year starting at age 55. Flexible sigmoidoscopy or colonoscopy. You may have a sigmoidoscopy every 5 years or a colonoscopy every 10 years starting at age 73. Prostate cancer screening. Recommendations will vary depending on your family history and other risks. Hepatitis C blood test. Hepatitis B blood test. Sexually transmitted disease (STD) testing. Diabetes screening. This is done by checking your blood sugar (glucose) after you have not eaten for a while (fasting). You may have this done every 1-3 years. Abdominal aortic aneurysm (AAA) screening. You may need this if you are a current or former smoker. Osteoporosis. You may be screened starting at age 16 if you are at high risk. Talk with your health care provider about your test results, treatment options, and if necessary, the need for more tests. Vaccines  Your health care provider may recommend certain vaccines, such as: Influenza vaccine. This is recommended  every year. Tetanus, diphtheria, and acellular pertussis (Tdap, Td) vaccine. You may need a Td booster every 10  years. Zoster vaccine. You may need this after age 108. Pneumococcal 13-valent conjugate (PCV13) vaccine. One dose is recommended after age 66. Pneumococcal polysaccharide (PPSV23) vaccine. One dose is recommended after age 40. Talk to your health care provider about which screenings and vaccines you need and how often you need them. This information is not intended to replace advice given to you by your health care provider. Make sure you discuss any questions you have with your health care provider. Document Released: 11/01/2015 Document Revised: 06/24/2016 Document Reviewed: 08/06/2015 Elsevier Interactive Patient Education  2017 [employer] Prevention in the Home Falls can cause injuries. They can happen to people of all ages. There are many things you can do to make your home safe and to help prevent falls. What can I do on the outside of my home? Regularly fix the edges of walkways and driveways and fix any cracks. Remove anything that might make you trip as you walk through a door, such as a raised step or threshold. Trim any bushes or trees on the path to your home. Use bright outdoor lighting. Clear any walking paths of anything that might make someone trip, such as rocks or tools. Regularly check to see if handrails are loose or broken. Make sure that both sides of any steps have handrails. Any raised decks and porches should have guardrails on the edges. Have any leaves, snow, or ice cleared regularly. Use sand or salt on walking paths during winter. Clean up any spills in your garage right away. This includes oil or grease spills. What can I do in the bathroom? Use night lights. Install grab bars by the toilet and in the tub and shower. Do not use towel bars as grab bars. Use non-skid mats or decals in the tub or shower. If you need to sit down in the shower, use a plastic, non-slip stool. Keep the floor dry. Clean up any water that spills on the floor as soon as it  happens. Remove soap buildup in the tub or shower regularly. Attach bath mats securely with double-sided non-slip rug tape. Do not have throw rugs and other things on the floor that can make you trip. What can I do in the bedroom? Use night lights. Make sure that you have a light by your bed that is easy to reach. Do not use any sheets or blankets that are too big for your bed. They should not hang down onto the floor. Have a firm chair that has side arms. You can use this for support while you get dressed. Do not have throw rugs and other things on the floor that can make you trip. What can I do in the kitchen? Clean up any spills right away. Avoid walking on wet floors. Keep items that you use a lot in easy-to-reach places. If you need to reach something above you, use a strong step stool that has a grab bar. Keep electrical cords out of the way. Do not use floor polish or wax that makes floors slippery. If you must use wax, use non-skid floor wax. Do not have throw rugs and other things on the floor that can make you trip. What can I do with my stairs? Do not leave any items on the stairs. Make sure that there are handrails on both sides of the stairs and use them. Fix handrails that are broken  or loose. Make sure that handrails are as long as the stairways. Check any carpeting to make sure that it is firmly attached to the stairs. Fix any carpet that is loose or worn. Avoid having throw rugs at the top or bottom of the stairs. If you do have throw rugs, attach them to the floor with carpet tape. Make sure that you have a light switch at the top of the stairs and the bottom of the stairs. If you do not have them, ask someone to add them for you. What else can I do to help prevent falls? Wear shoes that: Do not have high heels. Have rubber bottoms. Are comfortable and fit you well. Are closed at the toe. Do not wear sandals. If you use a stepladder: Make sure that it is fully opened.  Do not climb a closed stepladder. Make sure that both sides of the stepladder are locked into place. Ask someone to hold it for you, if possible. Clearly mark and make sure that you can see: Any grab bars or handrails. First and last steps. Where the edge of each step is. Use tools that help you move around (mobility aids) if they are needed. These include: Canes. Walkers. Scooters. Crutches. Turn on the lights when you go into a dark area. Replace any light bulbs as soon as they burn out. Set up your furniture so you have a clear path. Avoid moving your furniture around. If any of your floors are uneven, fix them. If there are any pets around you, be aware of where they are. Review your medicines with your doctor. Some medicines can make you feel dizzy. This can increase your chance of falling. Ask your doctor what other things that you can do to help prevent falls. This information is not intended to replace advice given to you by your health care provider. Make sure you discuss any questions you have with your health care provider. Document Released: 08/01/2009 Document Revised: 03/12/2016 Document Reviewed: 11/09/2014 Elsevier Interactive Patient Education  2017 [employer].

## 2021-05-14 NOTE — Progress Notes (Signed)
Subjective:   Timothy Duran is a 75 y.o. male who presents for an Initial Medicare Annual Wellness Visit.  Virtual Visit via Telephone Note  I connected with  Timothy Duran on 05/14/21 at  8:15 AM EDT by telephone and verified that I am speaking with the correct person using two identifiers.  Location: Patient: Home Provider: [facility] Persons participating in the virtual visit: patient/Nurse Health Advisor   I discussed the limitations, risks, security and privacy concerns of performing an evaluation and management service by telephone and the availability of in person appointments. The patient expressed understanding and agreed to proceed.  Interactive audio and video telecommunications were attempted between this nurse and patient, however failed, due to patient having technical difficulties OR patient did not have access to video capability.  We continued and completed visit with audio only.  Some vital signs may be absent or patient reported.   Timothy Duran E Timothy Strozier, LPN   Review of Systems     Cardiac Risk Factors include: advanced age (>64mn, >>31women);male gender;dyslipidemia;hypertension;Other (see comment), Risk factor comments: hx of TIA     Objective:    Today's Vitals   05/14/21 0813  Weight: 165 lb (74.8 kg)  Height: '5\' 8"'$  (1.727 m)   Body mass index is 25.09 kg/m.  Advanced Directives 05/14/2021 04/06/2016 12/10/2015 07/20/2015 07/20/2015 06/19/2015 06/18/2015  Does Patient Have a Medical Advance Directive? No No No No No No No  Would patient like information on creating a medical advance directive? No - Patient declined Yes - Educational materials given No - patient declined information No - patient declined information No - patient declined information No - patient declined information No - patient declined information    Current Medications (verified) Outpatient Encounter Medications as of 05/14/2021  Medication Sig   clopidogrel (PLAVIX) 75 MG tablet Take 1 tablet (75 mg  total) by mouth daily.   olmesartan-hydrochlorothiazide (BENICAR HCT) 20-12.5 MG tablet Take 1 tablet by mouth daily.   triamcinolone (KENALOG) 0.1 % Apply 1 application topically 2 (two) times daily.   No facility-administered encounter medications on file as of 05/14/2021.    Allergies (verified) Other and Penicillins   History: Past Medical History:  Diagnosis Date   Abdominal hernia    "in there now" (05/20/2015)   Arthritis    "terrible; all over" (05/20/2015)   Basal cell carcinoma    "left neck"   Diverticulosis    Epistaxis 05/20/2015   hospitalized   Hypercholesterolemia    Hypertension    Kidney stone    Prostate cancer ([facility] 2010   prostatectomy   TIA (transient ischemic attack) 2005   ?   Past Surgical History:  Procedure Laterality Date   BASAL CELL CARCINOMA EXCISION Left 2015   "neck"   BLADDER STONE REMOVAL  09/2009   CYSTOSCOPY W/ STONE MANIPULATION  1994   "got one stone; still has one lodge in there" (05/20/2015)   INCISION AND DRAINAGE ABSCESS Left 06/20/2015   Procedure: INCISION AND DRAINAGE ABSCESS;  Surgeon: DMelina Schools MD;  Location: [facility];  Service: Orthopedics;  Laterality: Left;   KIDNEY STONE SURGERY  1995   KNEE ARTHROTOMY Left 07/20/2015   Procedure: KNEE ARTHROTOMY Incision and Drainage left knee;  Surgeon: MParalee Cancel MD;  Location: [facility];  Service: Orthopedics;  Laterality: Left;   NASAL ENDOSCOPY WITH EPISTAXIS CONTROL Left 05/21/2015   Procedure: NASAL ENDOSCOPY WITH EPISTAXIS CONTROL;  Surgeon: JMelissa Montane MD;  [facility]  Service: ENT;  Laterality: Left;   PROSTATECTOMY  01/2010   SHOULDER SURGERY  11/2015   torn ligament   VIDEO ASSISTED THORACOSCOPY (VATS)/WEDGE RESECTION Right 10/2009   "upper; benign"   Family History  Problem Relation Age of Onset   Dementia Mother    Prostate cancer Father        eye, lung, prostate   Prostate cancer Brother    Colon cancer Sister    Social History   Socioeconomic History   Marital  status: Married    Spouse name: Bethena Roys   Number of children: 2   Years of education: 12   Highest education level: Not on file  Occupational History   Occupation: retired    Comment: part time- Chief Financial Officer for [employer]  Tobacco Use   Smoking status: Former    Packs/day: 1.00    Years: 4.00    Pack years: 4.00    Types: Cigarettes    Quit date: 08/29/2008    Years since quitting: 12.7   Smokeless tobacco: Never  Vaping Use   Vaping Use: Never used  Substance and Sexual Activity   Alcohol use: No    Alcohol/week: 0.0 standard drinks   Drug use: No   Sexual activity: Not on file  Other Topics Concern   Not on file  Social History Narrative   Lives with wife - 2 daughters live nearby   'caffeine use- coffee, 2 cups a day, occas tea   Social Determinants of Health   Financial Resource Strain: Low Risk    Difficulty of Paying Living Expenses: Not very hard  Food Insecurity: No Food Insecurity   Worried About Charity fundraiser in the Last Year: Never true   [place] in the Last Year: Never true  Transportation Needs: No Transportation Needs   Lack of Transportation (Medical): No   Lack of Transportation (Non-Medical): No  Physical Activity: Sufficiently Active   Days of Exercise per Week: 7 days   Minutes of Exercise per Session: 30 min  Stress: No Stress Concern Present   Feeling of Stress : Not at all  Social Connections: Socially Integrated   Frequency of Communication with Friends and Family: More than three times a week   Frequency of Social Gatherings with Friends and Family: More than three times a week   Attends Religious Services: More than 4 times per year   Active Member of [employer] or Organizations: Yes   Attends Music therapist: More than 4 times per year   Marital Status: Married    Tobacco Counseling Counseling given: Not Answered   Clinical Intake:  Pre-visit preparation completed: Yes  Pain : No/denies pain     BMI -  recorded: 25.09 Nutritional Status: BMI 25 -29 Overweight Nutritional Risks: None Diabetes: No  How often do you need to have someone help you when you read instructions, pamphlets, or other written materials from your doctor or pharmacy?: 1 - Never  Diabetic? No  Interpreter Needed?: No  Information entered by :: Loralei Radcliffe, LPN   Activities of Daily Living In your present state of health, do you have any difficulty performing the following activities: 05/14/2021 06/25/2020  Hearing? N N  Vision? N Y  Difficulty concentrating or making decisions? N N  Walking or climbing stairs? N N  Dressing or bathing? N N  Doing errands, shopping? N N  Preparing Food and eating ? N -  Using the Toilet? N -  In the past six  months, have you accidently leaked urine? N -  Do you have problems with loss of bowel control? N -  Managing your Medications? N -  Managing your Finances? N -  Housekeeping or managing your Housekeeping? N -  Some recent data might be hidden    Patient Care Team: Chevis Pretty, FNP as PCP - General (Nurse Practitioner)  Indicate any recent Medical Services you may have received from other than [facility] providers in the past year (date may be approximate).     Assessment:   This is a routine wellness examination for Kendrix.  Hearing/Vision screen Hearing Screening - Comments:: Denies hearing difficulties  Vision Screening - Comments:: Wears eyeglasses - up to date with annual eye exams with Dr Marin Comment in [place] issues and exercise activities discussed: Current Exercise Habits: Home exercise routine, Type of exercise: walking, Time (Minutes): 30, Frequency (Times/Week): 7, Weekly Exercise (Minutes/Week): 210, Intensity: Mild, Exercise limited by: None identified   Goals Addressed   None    Depression Screen PHQ 2/9 Scores 05/14/2021 12/23/2020 06/25/2020 06/06/2019 08/24/2018 01/28/2018 06/04/2017  PHQ - 2 Score 0 0 0 0 0 0 0  PHQ- 9 Score - - 0 - - - -     Fall Risk Fall Risk  05/14/2021 12/23/2020 06/25/2020 06/06/2019 08/24/2018  Falls in the past year? 0 0 0 1 0  Number falls in past yr: 0 - - 0 -  Injury with Fall? 0 - - 0 -  Risk for fall due to : No Fall Risks - - - -  Risk for fall due to: Comment - - - - -  Follow up Falls prevention discussed - - - -    FALL RISK PREVENTION PERTAINING TO THE HOME:  Any stairs in or around the home? Yes  If so, are there any without handrails? Yes  none going to basement - advised to have these installed, but patient insists he doesn't need them and if he gets to a point where he does, he just won't go down there Home free of loose throw rugs in walkways, pet beds, electrical cords, etc? Yes  Adequate lighting in your home to reduce risk of falls? Yes   ASSISTIVE DEVICES UTILIZED TO PREVENT FALLS:  Life alert? No  Use of a cane, walker or w/c? No  Grab bars in the bathroom? No  Shower chair or bench in shower? No  Elevated toilet seat or a handicapped toilet? No   TIMED UP AND GO:  Was the test performed? No . Telephonic visit  Cognitive Function: Normal cognitive status assessed by direct observation by this Nurse Health Advisor. No abnormalities found.       6CIT Screen 05/14/2021  What Year? 0 points  What month? 0 points  What time? 0 points  Count back from 20 0 points  Months in reverse 2 points  Repeat phrase 2 points  Total Score 4    Immunizations Immunization History  Administered Date(s) Administered   Influenza,inj,Quad PF,6+ Mos 07/11/2015    TDAP status: Due, Education has been provided regarding the importance of this vaccine. Advised may receive this vaccine at local pharmacy or Health Dept. Aware to provide a copy of the vaccination record if obtained from local pharmacy or Health Dept. Verbalized acceptance and understanding.  Flu Vaccine status: Declined, Education has been provided regarding the importance of this vaccine but patient still declined. Advised may  receive this vaccine at local pharmacy or Health Dept. Aware to provide  a copy of the vaccination record if obtained from local pharmacy or Health Dept. Verbalized acceptance and understanding.  Pneumococcal vaccine status: Declined,  Education has been provided regarding the importance of this vaccine but patient still declined. Advised may receive this vaccine at local pharmacy or Health Dept. Aware to provide a copy of the vaccination record if obtained from local pharmacy or Health Dept. Verbalized acceptance and understanding.   Covid-19 vaccine status: Declined, Education has been provided regarding the importance of this vaccine but patient still declined. Advised may receive this vaccine at local pharmacy or Health Dept.or vaccine clinic. Aware to provide a copy of the vaccination record if obtained from local pharmacy or Health Dept. Verbalized acceptance and understanding.  Qualifies for Shingles Vaccine? Yes   Zostavax completed No   Shingrix Completed?: No.    Education has been provided regarding the importance of this vaccine. Patient has been advised to call insurance company to determine out of pocket expense if they have not yet received this vaccine. Advised may also receive vaccine at local pharmacy or Health Dept. Verbalized acceptance and understanding.  Screening Tests Health Maintenance  Topic Date Due   COVID-19 Vaccine (1) Never done   TETANUS/TDAP  Never done   Zoster Vaccines- Shingrix (1 of 2) Never done   PNA vac Low Risk Adult (1 of 2 - PCV13) 06/25/2021 (Originally 01/24/2009)   INFLUENZA VACCINE  05/19/2021   Hepatitis C Screening  Completed   HPV VACCINES  Aged Out    Health Maintenance  Health Maintenance Due  Topic Date Due   COVID-19 Vaccine (1) Never done   TETANUS/TDAP  Never done   Zoster Vaccines- Shingrix (1 of 2) Never done    Colorectal cancer screening: No longer required.   Lung Cancer Screening: (Low Dose CT Chest recommended if Age 14-80  years, 30 pack-year currently smoking OR have quit w/in 15years.) does not qualify.   Additional Screening:  Hepatitis C Screening: does qualify; Completed 07/22/2015  Vision Screening: Recommended annual ophthalmology exams for early detection of glaucoma and other disorders of the eye. Is the patient up to date with their annual eye exam?  Yes  Who is the provider or what is the name of the office in which the patient attends annual eye exams? Dr Marin Comment in [place] If pt is not established with a provider, would they like to be referred to a provider to establish care? No .   Dental Screening: Recommended annual dental exams for proper oral hygiene  Community Resource Referral / Chronic Care Management: CRR required this visit?  No   CCM required this visit?  No      Plan:     I have personally reviewed and noted the following in the patient's chart:   Medical and social history Use of alcohol, tobacco or illicit drugs  Current medications and supplements including opioid prescriptions. Patient is not currently taking opioid prescriptions. Functional ability and status Nutritional status Physical activity Advanced directives List of other physicians Hospitalizations, surgeries, and ER visits in previous 12 months Vitals Screenings to include cognitive, depression, and falls Referrals and appointments  In addition, I have reviewed and discussed with patient certain preventive protocols, quality metrics, and best practice recommendations. A written personalized care plan for preventive services as well as general preventive health recommendations were provided to patient.     Sandrea Hammond, LPN   [id number]   Nurse Notes: None

## 2021-05-15 ENCOUNTER — Emergency Department (HOSPITAL_COMMUNITY): Payer: Medicare PPO

## 2021-05-15 ENCOUNTER — Inpatient Hospital Stay (HOSPITAL_COMMUNITY)
Admission: EM | Admit: 2021-05-15 | Discharge: 2021-05-19 | DRG: 418 | Disposition: A | Discharge status: Home Health | Payer: Medicare PPO | Attending: Internal Medicine | Admitting: Internal Medicine

## 2021-05-15 ENCOUNTER — Other Ambulatory Visit: Payer: Self-pay

## 2021-05-15 ENCOUNTER — Encounter (HOSPITAL_COMMUNITY): Payer: Self-pay

## 2021-05-15 ENCOUNTER — Ambulatory Visit: Payer: Medicare PPO | Admitting: Family Medicine

## 2021-05-15 ENCOUNTER — Telehealth: Payer: Self-pay | Admitting: Nurse Practitioner

## 2021-05-15 DIAGNOSIS — Z87891 Personal history of nicotine dependence: Secondary | ICD-10-CM | POA: Diagnosis not present

## 2021-05-15 DIAGNOSIS — K219 Gastro-esophageal reflux disease without esophagitis: Secondary | ICD-10-CM | POA: Diagnosis present

## 2021-05-15 DIAGNOSIS — Z20822 Contact with and (suspected) exposure to covid-19: Secondary | ICD-10-CM | POA: Diagnosis present

## 2021-05-15 DIAGNOSIS — I10 Essential (primary) hypertension: Secondary | ICD-10-CM | POA: Diagnosis present

## 2021-05-15 DIAGNOSIS — Z79899 Other long term (current) drug therapy: Secondary | ICD-10-CM

## 2021-05-15 DIAGNOSIS — Z8673 Personal history of transient ischemic attack (TIA), and cerebral infarction without residual deficits: Secondary | ICD-10-CM | POA: Diagnosis not present

## 2021-05-15 DIAGNOSIS — I447 Left bundle-branch block, unspecified: Secondary | ICD-10-CM | POA: Diagnosis present

## 2021-05-15 DIAGNOSIS — K8000 Calculus of gallbladder with acute cholecystitis without obstruction: Secondary | ICD-10-CM | POA: Diagnosis not present

## 2021-05-15 DIAGNOSIS — K8010 Calculus of gallbladder with chronic cholecystitis without obstruction: Secondary | ICD-10-CM | POA: Diagnosis present

## 2021-05-15 DIAGNOSIS — E782 Mixed hyperlipidemia: Secondary | ICD-10-CM | POA: Diagnosis present

## 2021-05-15 DIAGNOSIS — Z8546 Personal history of malignant neoplasm of prostate: Secondary | ICD-10-CM

## 2021-05-15 DIAGNOSIS — Z7902 Long term (current) use of antithrombotics/antiplatelets: Secondary | ICD-10-CM | POA: Diagnosis not present

## 2021-05-15 DIAGNOSIS — Z85828 Personal history of other malignant neoplasm of skin: Secondary | ICD-10-CM

## 2021-05-15 DIAGNOSIS — Z880 Allergy status to penicillin: Secondary | ICD-10-CM | POA: Diagnosis not present

## 2021-05-15 DIAGNOSIS — K810 Acute cholecystitis: Secondary | ICD-10-CM | POA: Diagnosis not present

## 2021-05-15 DIAGNOSIS — Z8042 Family history of malignant neoplasm of prostate: Secondary | ICD-10-CM | POA: Diagnosis not present

## 2021-05-15 DIAGNOSIS — A419 Sepsis, unspecified organism: Secondary | ICD-10-CM | POA: Diagnosis not present

## 2021-05-15 DIAGNOSIS — R1013 Epigastric pain: Secondary | ICD-10-CM | POA: Diagnosis not present

## 2021-05-15 DIAGNOSIS — K565 Intestinal adhesions [bands], unspecified as to partial versus complete obstruction: Secondary | ICD-10-CM | POA: Diagnosis present

## 2021-05-15 DIAGNOSIS — R140 Abdominal distension (gaseous): Secondary | ICD-10-CM | POA: Diagnosis not present

## 2021-05-15 DIAGNOSIS — K8012 Calculus of gallbladder with acute and chronic cholecystitis without obstruction: Secondary | ICD-10-CM

## 2021-05-15 DIAGNOSIS — R1011 Right upper quadrant pain: Secondary | ICD-10-CM

## 2021-05-15 DIAGNOSIS — K8001 Calculus of gallbladder with acute cholecystitis with obstruction: Principal | ICD-10-CM | POA: Diagnosis present

## 2021-05-15 DIAGNOSIS — Z885 Allergy status to narcotic agent status: Secondary | ICD-10-CM | POA: Diagnosis not present

## 2021-05-15 DIAGNOSIS — K8019 Calculus of gallbladder with other cholecystitis with obstruction: Secondary | ICD-10-CM | POA: Diagnosis present

## 2021-05-15 DIAGNOSIS — R109 Unspecified abdominal pain: Secondary | ICD-10-CM | POA: Diagnosis not present

## 2021-05-15 DIAGNOSIS — K819 Cholecystitis, unspecified: Secondary | ICD-10-CM | POA: Diagnosis not present

## 2021-05-15 DIAGNOSIS — K8020 Calculus of gallbladder without cholecystitis without obstruction: Secondary | ICD-10-CM | POA: Diagnosis not present

## 2021-05-15 DIAGNOSIS — E860 Dehydration: Secondary | ICD-10-CM | POA: Diagnosis present

## 2021-05-15 DIAGNOSIS — E872 Acidosis: Secondary | ICD-10-CM | POA: Diagnosis present

## 2021-05-15 DIAGNOSIS — R1084 Generalized abdominal pain: Secondary | ICD-10-CM | POA: Diagnosis not present

## 2021-05-15 LAB — CBC WITH DIFFERENTIAL/PLATELET
Abs Immature Granulocytes: 0.05 10*3/uL (ref 0.00–0.07)
Basophils Absolute: 0.1 10*3/uL (ref 0.0–0.1)
Basophils Relative: 1 %
Eosinophils Absolute: 0 10*3/uL (ref 0.0–0.5)
Eosinophils Relative: 0 %
HCT: 46.8 % (ref 39.0–52.0)
Hemoglobin: 15.7 g/dL (ref 13.0–17.0)
Immature Granulocytes: 0 %
Lymphocytes Relative: 9 %
Lymphs Abs: 1.1 10*3/uL (ref 0.7–4.0)
MCH: 31.5 pg (ref 26.0–34.0)
MCHC: 33.5 g/dL (ref 30.0–36.0)
MCV: 93.8 fL (ref 80.0–100.0)
Monocytes Absolute: 0.4 10*3/uL (ref 0.1–1.0)
Monocytes Relative: 3 %
Neutro Abs: 9.9 10*3/uL — ABNORMAL HIGH (ref 1.7–7.7)
Neutrophils Relative %: 87 %
Platelets: 242 10*3/uL (ref 150–400)
RBC: 4.99 MIL/uL (ref 4.22–5.81)
RDW: 13.5 % (ref 11.5–15.5)
WBC: 11.5 10*3/uL — ABNORMAL HIGH (ref 4.0–10.5)
nRBC: 0 % (ref 0.0–0.2)

## 2021-05-15 LAB — URINALYSIS, ROUTINE W REFLEX MICROSCOPIC
Bilirubin Urine: NEGATIVE
Glucose, UA: NEGATIVE mg/dL
Hgb urine dipstick: NEGATIVE
Ketones, ur: NEGATIVE mg/dL
Leukocytes,Ua: NEGATIVE
Nitrite: NEGATIVE
Protein, ur: NEGATIVE mg/dL
Specific Gravity, Urine: 1.033 — ABNORMAL HIGH (ref 1.005–1.030)
pH: 8 (ref 5.0–8.0)

## 2021-05-15 LAB — PROTIME-INR
INR: 1 (ref 0.8–1.2)
Prothrombin Time: 13 seconds (ref 11.4–15.2)

## 2021-05-15 LAB — COMPREHENSIVE METABOLIC PANEL
ALT: 21 U/L (ref 0–44)
AST: 20 U/L (ref 15–41)
Albumin: 4.3 g/dL (ref 3.5–5.0)
Alkaline Phosphatase: 67 U/L (ref 38–126)
Anion gap: 12 (ref 5–15)
BUN: 25 mg/dL — ABNORMAL HIGH (ref 8–23)
CO2: 24 mmol/L (ref 22–32)
Calcium: 10.3 mg/dL (ref 8.9–10.3)
Chloride: 105 mmol/L (ref 98–111)
Creatinine, Ser: 0.8 mg/dL (ref 0.61–1.24)
GFR, Estimated: >60 mL/min (ref >60)
Glucose, Bld: 123 mg/dL — ABNORMAL HIGH (ref 70–99)
Potassium: 4.1 mmol/L (ref 3.5–5.1)
Sodium: 141 mmol/L (ref 135–145)
Total Bilirubin: 0.5 mg/dL (ref 0.3–1.2)
Total Protein: 6.9 g/dL (ref 6.5–8.1)

## 2021-05-15 LAB — LACTIC ACID, PLASMA
Lactic Acid, Venous: 2.3 mmol/L (ref 0.5–1.9)
Lactic Acid, Venous: 2.6 mmol/L (ref 0.5–1.9)
Lactic Acid, Venous: 1.3 mmol/L (ref 0.5–1.9)

## 2021-05-15 LAB — APTT: aPTT: 27 seconds (ref 24–36)

## 2021-05-15 LAB — TROPONIN I (HIGH SENSITIVITY)
Troponin I (High Sensitivity): 3 ng/L (ref <18)
Troponin I (High Sensitivity): 3 ng/L (ref <18)

## 2021-05-15 LAB — RESP PANEL BY RT-PCR (FLU A&B, COVID) ARPGX2
Influenza A by PCR: NEGATIVE
Influenza B by PCR: NEGATIVE
SARS Coronavirus 2 by RT PCR: NEGATIVE

## 2021-05-15 LAB — LIPASE, BLOOD: Lipase: 32 U/L (ref 11–51)

## 2021-05-15 MED ORDER — IOHEXOL 350 MG/ML SOLN
85.0000 mL | Freq: Once | INTRAVENOUS | Status: AC | PRN
  Administered 2021-05-15: 80 mL via INTRAVENOUS

## 2021-05-15 MED ORDER — HYDROMORPHONE HCL 1 MG/ML IJ SOLN
0.5000 mg | Freq: Once | INTRAMUSCULAR | Status: AC
  Administered 2021-05-15: 0.5 mg via INTRAVENOUS
  Filled 2021-05-15: qty 1

## 2021-05-15 MED ORDER — HYDROCHLOROTHIAZIDE 10 MG/ML ORAL SUSPENSION
6.2500 mg | Freq: Every day | ORAL | Status: DC
  Filled 2021-05-15: qty 1.25

## 2021-05-15 MED ORDER — ONDANSETRON HCL 4 MG PO TABS: 4.0000 mg | ORAL_TABLET | Freq: Four times a day (QID) | ORAL | Status: DC | PRN

## 2021-05-15 MED ORDER — LACTATED RINGERS IV BOLUS (SEPSIS)
500.0000 mL | Freq: Once | INTRAVENOUS | Status: AC
  Administered 2021-05-15: 500 mL via INTRAVENOUS

## 2021-05-15 MED ORDER — LACTATED RINGERS IV SOLN: INTRAVENOUS | Status: DC

## 2021-05-15 MED ORDER — SODIUM CHLORIDE 0.9 % IV SOLN
2.0000 g | INTRAVENOUS | Status: DC
  Administered 2021-05-15 – 2021-05-17 (×3): 2 g via INTRAVENOUS
  Filled 2021-05-15 (×2): qty 20
  Filled 2021-05-15: qty 2
  Filled 2021-05-15: qty 20

## 2021-05-15 MED ORDER — ACETAMINOPHEN 650 MG RE SUPP: 650.0000 mg | Freq: Four times a day (QID) | RECTAL | Status: DC | PRN

## 2021-05-15 MED ORDER — PANTOPRAZOLE SODIUM 40 MG IV SOLR
40.0000 mg | Freq: Once | INTRAVENOUS | Status: AC
  Administered 2021-05-15: 40 mg via INTRAVENOUS
  Filled 2021-05-15: qty 40

## 2021-05-15 MED ORDER — ACETAMINOPHEN 325 MG PO TABS
650.0000 mg | ORAL_TABLET | Freq: Four times a day (QID) | ORAL | Status: DC | PRN
  Administered 2021-05-18: 650 mg via ORAL
  Filled 2021-05-15: qty 2

## 2021-05-15 MED ORDER — HYDROMORPHONE HCL 1 MG/ML IJ SOLN
0.5000 mg | INTRAMUSCULAR | Status: DC | PRN
Start: 2021-05-15 — End: 2021-05-18
  Administered 2021-05-15 – 2021-05-18 (×6): 0.5 mg via INTRAVENOUS
  Filled 2021-05-15 (×6): qty 0.5

## 2021-05-15 MED ORDER — ONDANSETRON HCL 4 MG/2ML IJ SOLN
4.0000 mg | Freq: Four times a day (QID) | INTRAMUSCULAR | Status: DC | PRN
  Administered 2021-05-15: 4 mg via INTRAVENOUS
  Filled 2021-05-15: qty 2

## 2021-05-15 MED ORDER — IRBESARTAN 75 MG PO TABS
75.0000 mg | ORAL_TABLET | Freq: Every day | ORAL | Status: DC
  Filled 2021-05-15: qty 1

## 2021-05-15 MED ORDER — OLMESARTAN MEDOXOMIL-HCTZ 20-12.5 MG PO TABS: 1.0000 | ORAL_TABLET | Freq: Every day | ORAL | Status: DC

## 2021-05-15 NOTE — H&P (Signed)
History and Physical    Timothy Duran [id number] DOB: 05-Feb-1946 DOA: 05/15/2021  PCP: Chevis Pretty, FNP  Patient coming from: Home  I have personally briefly reviewed patient's old medical records available.   Chief Complaint: Abdominal pain and distention, frequent vomiting  HPI: Timothy Duran is a 75 y.o. male with medical history significant of TIA on Plavix, hypertension no other significant medical issues comes to the emergency room with multiple episodes of vomiting since today morning, diffuse epigastric abdominal pain.  According to the patient, he does not have any history of abdominal issues.  Today morning he woke up at 5 AM had 3 episodes of bilious vomiting, he started having diffuse upper abdominal pain, all over the upper abdomen, distention feeling, no radiation, not taken anything to relieve the pain.  Pain was severe at time 8-9 in intensity.  Some relief with vomiting, however cyclical.  Urine and bowel habits are normal. Patient denies any fever at home, noted to be afebrile in the ER.  On room air. Denies any cough cold URI symptoms.  Denies any chest pain or shortness of breath or wheezing.  Denies any headache, dizziness lightheadedness or weakness. ED Course: Hemodynamically stable.  WBC 11.5.  Electrolytes normal.  Afebrile.  CT scan abdomen pelvis with no acute findings but cholelithiasis. Right upper quadrant ultrasound with cholelithiasis but negative Murphy sign. LFTs are normal. Lactate was 2.6, became normal with 500 mL fluid only. Patient has taken Plavix today morning.  Review of Systems: all systems are reviewed and pertinent positive as per HPI otherwise rest are negative.    Past Medical History:  Diagnosis Date   Abdominal hernia    "in there now" (05/20/2015)   Arthritis    "terrible; all over" (05/20/2015)   Basal cell carcinoma    "left neck"   Diverticulosis    Epistaxis 05/20/2015   hospitalized   Hypercholesterolemia    Hypertension     Kidney stone    Prostate cancer ([place]) 2010   prostatectomy   TIA (transient ischemic attack) 2005   ?    Past Surgical History:  Procedure Laterality Date   BASAL CELL CARCINOMA EXCISION Left 2015   "neck"   BLADDER STONE REMOVAL  09/2009   CYSTOSCOPY W/ STONE MANIPULATION  1994   "got one stone; still has one lodge in there" (05/20/2015)   INCISION AND DRAINAGE ABSCESS Left 06/20/2015   Procedure: INCISION AND DRAINAGE ABSCESS;  Surgeon: Melina Schools, MD;  Location: [facility];  Service: Orthopedics;  Laterality: Left;   KIDNEY STONE SURGERY  1995   KNEE ARTHROTOMY Left 07/20/2015   Procedure: KNEE ARTHROTOMY Incision and Drainage left knee;  Surgeon: Paralee Cancel, MD;  Location: [facility];  Service: Orthopedics;  Laterality: Left;   NASAL ENDOSCOPY WITH EPISTAXIS CONTROL Left 05/21/2015   Procedure: NASAL ENDOSCOPY WITH EPISTAXIS CONTROL;  Surgeon: Melissa Montane, MD;  Location: [facility] OR;  Service: ENT;  Laterality: Left;   PROSTATECTOMY  01/2010   SHOULDER SURGERY  11/2015   torn ligament   VIDEO ASSISTED THORACOSCOPY (VATS)/WEDGE RESECTION Right 10/2009   "upper; benign"    Social history   reports that he quit smoking about 12 years ago. His smoking use included cigarettes. He has a 4.00 pack-year smoking history. He has never used smokeless tobacco. He reports that he does not drink alcohol and does not use drugs.  Allergies  Allergen Reactions   Other Other (See Comments)    Nasal packing- headaches and facial pain  Penicillins Other (See Comments)    Blisters  Patient states this was a childhood reaction    Family History  Problem Relation Age of Onset   Dementia Mother    Prostate cancer Father        eye, lung, prostate   Prostate cancer Brother    Colon cancer Sister      Prior to Admission medications   Medication Sig Start Date End Date Taking? Authorizing Provider  Bioflavonoid Products (ESTER C PO) Take 1 tablet by mouth daily.   Yes [provider]   Cholecalciferol (VITAMIN D3 PO) Take 1 tablet by mouth daily.   Yes [provider]  clopidogrel (PLAVIX) 75 MG tablet Take 1 tablet (75 mg total) by mouth daily. 12/23/20  Yes Martin, Mary-Margaret, FNP  MAGNESIUM PO Take 1 tablet by mouth daily.   Yes [provider]  MECLIZINE HCL PO Take 1 tablet by mouth daily as needed (dizziness, vertigo).   Yes [provider]  Multiple Vitamins-Minerals (ZINC PO) Take 1 tablet by mouth daily.   Yes [provider]  olmesartan-hydrochlorothiazide (BENICAR HCT) 20-12.5 MG tablet Take 1 tablet by mouth daily. Patient taking differently: Take 0.5 tablets by mouth daily. 12/23/20  Yes Martin, Mary-Margaret, FNP  triamcinolone (KENALOG) 0.1 % Apply 1 application topically 2 (two) times daily. Patient not taking: Reported on 05/15/2021 09/09/20   Chevis Pretty, FNP    Physical Exam: Vitals:   05/15/21 1345 05/15/21 1430 05/15/21 1530 05/15/21 1630  BP: (!) 161/82 (!) 142/91 127/74 136/62  Pulse: 64 62 65 60  Resp: '17 19 16 18  '$ Temp:      TempSrc:      SpO2: 98% 95% 93% 91%    Constitutional: NAD, looks fairly comfortable after dose of Dilaudid in the ER.  Slightly anxious. Vitals:   05/15/21 1345 05/15/21 1430 05/15/21 1530 05/15/21 1630  BP: (!) 161/82 (!) 142/91 127/74 136/62  Pulse: 64 62 65 60  Resp: '17 19 16 18  '$ Temp:      TempSrc:      SpO2: 98% 95% 93% 91%   Eyes: PERRL, lids and conjunctivae normal ENMT: Mucous membranes are moist. Posterior pharynx clear of any exudate or lesions.Normal dentition.  Neck: normal, supple, no masses, no thyromegaly Respiratory: clear to auscultation bilaterally, no wheezing, no crackles. Normal respiratory effort. No accessory muscle use.  Cardiovascular: Regular rate and rhythm, no murmurs / rubs / gallops. No extremity edema. 2+ pedal pulses. No carotid bruits.  Abdomen: Abdomen is soft, mild diffuse tenderness along the epigastrium without any rigidity or  tenderness, no localized tenderness.  Murphy sign negative. Patient has rectus sheath gap with midline protuberance.  Bowel sounds present. Musculoskeletal: no clubbing / cyanosis. No joint deformity upper and lower extremities. Good ROM, no contractures. Normal muscle tone.  Skin: no rashes, lesions, ulcers. No induration Neurologic: CN 2-12 grossly intact. Sensation intact, DTR normal. Strength 5/5 in all 4.  Psychiatric: Normal judgment and insight. Alert and oriented x 3. Normal mood.     Labs on Admission: I have personally reviewed following labs and imaging studies  CBC: Recent Labs  Lab 05/15/21 1150  WBC 11.5*  NEUTROABS 9.9*  HGB 15.7  HCT 46.8  MCV 93.8  PLT [id number]   Basic Metabolic Panel: Recent Labs  Lab 05/15/21 1150  NA 141  K 4.1  CL 105  CO2 24  GLUCOSE 123*  BUN 25*  CREATININE 0.80  CALCIUM 10.3   GFR: Estimated Creatinine  Clearance: 74.8 mL/min (by C-G formula based on SCr of 0.8 mg/dL). Liver Function Tests: Recent Labs  Lab 05/15/21 1150  AST 20  ALT 21  ALKPHOS 67  BILITOT 0.5  PROT 6.9  ALBUMIN 4.3   Recent Labs  Lab 05/15/21 1150  LIPASE 32   No results for input(s): AMMONIA in the last 168 hours. Coagulation Profile: Recent Labs  Lab 05/15/21 1150  INR 1.0   Cardiac Enzymes: No results for input(s): CKTOTAL, CKMB, CKMBINDEX, TROPONINI in the last 168 hours. BNP (last 3 results) No results for input(s): PROBNP in the last 8760 hours. HbA1C: No results for input(s): HGBA1C in the last 72 hours. CBG: No results for input(s): GLUCAP in the last 168 hours. Lipid Profile: No results for input(s): CHOL, HDL, LDLCALC, TRIG, CHOLHDL, LDLDIRECT in the last 72 hours. Thyroid Function Tests: No results for input(s): TSH, T4TOTAL, FREET4, T3FREE, THYROIDAB in the last 72 hours. Anemia Panel: No results for input(s): VITAMINB12, FOLATE, FERRITIN, TIBC, IRON, RETICCTPCT in the last 72 hours. Urine analysis:    Component Value  Date/Time   COLORURINE YELLOW 05/15/2021 1456   APPEARANCEUR CLOUDY (A) 05/15/2021 1456   LABSPEC 1.033 (H) 05/15/2021 1456   PHURINE 8.0 05/15/2021 1456   GLUCOSEU NEGATIVE 05/15/2021 1456   HGBUR NEGATIVE 05/15/2021 1456   BILIRUBINUR NEGATIVE 05/15/2021 1456   KETONESUR NEGATIVE 05/15/2021 1456   PROTEINUR NEGATIVE 05/15/2021 1456   UROBILINOGEN 0.2 10/30/2009 1055   NITRITE NEGATIVE 05/15/2021 1456   LEUKOCYTESUR NEGATIVE 05/15/2021 1456    Radiological Exams on Admission: CT Abdomen Pelvis W Contrast  Result Date: 05/15/2021 CLINICAL DATA:  Abdominal pain and fever over the last few days. Distended abdomen. EXAM: CT ABDOMEN AND PELVIS WITH CONTRAST TECHNIQUE: Multidetector CT imaging of the abdomen and pelvis was performed using the standard protocol following bolus administration of intravenous contrast. CONTRAST:  35m OMNIPAQUE IOHEXOL 350 MG/ML SOLN COMPARISON:  CT 10/14/2016. FINDINGS: Lower chest: Chronic Bochdalek's hernia on the right containing only fat. Lung bases are clear. Coronary artery calcification and aortic atherosclerotic calcification are noted in the lower chest. Hepatobiliary: Liver parenchyma is normal. The gallbladder is somewhat distended but there are no calcified stones. No definite gallbladder inflammation. Consider ultrasound scan if there is primary concern regarding gallbladder pathology. Pancreas: Normal Spleen: Normal Adrenals/Urinary Tract: Adrenal glands are normal. Several small renal cysts on both sides. Nonobstructing 6 x 7 mm stone in the midportion the right kidney. No hydronephrosis. No stone along the course of either ureter. No stone in the bladder or prostate urethra. Stomach/Bowel: Stomach and small intestine are normal. The appendix is normal. The colon is normal. No evidence of diverticulosis or diverticulitis. No constipation. Vascular/Lymphatic: Aortic atherosclerosis. No aneurysm. IVC is normal. No adenopathy. Reproductive: Previous  prostatectomy. Other: No free fluid or air. Musculoskeletal: Ordinary mild degenerative changes affect the spine. IMPRESSION: No acute finding by CT. The gallbladder is slightly distended, but there are no visible stones and there is no apparent surrounding inflammatory change by CT. Consider ultrasound if there is concern regarding gallbladder disease. Multiple liver cysts and small nonobstructing renal calculi, the largest on the right measuring 6 x 7 mm. Aortic Atherosclerosis (ICD10-I70.0). Apparent previous prostatectomy. No ascites.  Evidence of bowel obstruction or focal bowel pathology. Electronically Signed   By: MNelson ChimesM.D.   On: 05/15/2021 14:06   DG Chest Port 1 View  Result Date: 05/15/2021 CLINICAL DATA:  Sepsis. EXAM: PORTABLE CHEST 1 VIEW COMPARISON:  05/08/2015 FINDINGS: The heart size  and mediastinal contours are within normal limits. Both lungs are clear. The visualized skeletal structures are unremarkable. IMPRESSION: No active disease. Electronically Signed   By: Kerby Moors M.D.   On: 05/15/2021 12:42   US Abdomen Limited RUQ (LIVER/GB)  Result Date: 05/15/2021 CLINICAL DATA:  75 year old male with right upper quadrant abdominal pain. EXAM: ULTRASOUND ABDOMEN LIMITED RIGHT UPPER QUADRANT COMPARISON:  CT abdomen pelvis dated 05/15/2021 and right upper quadrant ultrasound dated 10/05/2016. FINDINGS: Gallbladder: Multiple stones within gallbladder. No gallbladder wall thickening or pericholecystic fluid. Negative sonographic Murphy's sign. Common bile duct: Diameter: 6 mm Liver: No focal lesion identified. Within normal limits in parenchymal echogenicity. Portal vein is patent on color Doppler imaging with normal direction of blood flow towards the liver. Other: The right kidney demonstrates a slightly increased echogenicity. Several small cysts as well as a 2 cm stone is noted within the right kidney. These findings are better seen on the prior CT. IMPRESSION: 1. Cholelithiasis  without sonographic evidence of acute cholecystitis. 2. Several small cysts as well as a stone in the right kidney. Electronically Signed   By: Anner Crete M.D.   On: 05/15/2021 16:03    EKG: Independently reviewed.  Sinus rhythm.  Incomplete left branch block pattern, mostly similar to previous EKG from 2016.  Assessment/Plan Principal Problem:   Cholelithiasis and cholecystitis without obstruction Active Problems:   Hypertension   Gastroesophageal reflux disease without esophagitis   Mixed hyperlipidemia     1.  Cholelithiasis and cholecystitis without obstruction: Symptomatic cholelithiasis. Patient with symptomatic cholelithiasis.  Significant symptoms of nausea and vomiting and abdominal pain. Agree with observation, IV fluids, n.p.o., presumptive Rocephin for cholecystitis.  Blood cultures and urine cultures were drawn and results are pending. Adequate IV and oral opiates for pain relief. Surgery consulted from ER, possibly he will benefit with lap chole.  Timing to be determined by surgery as patient has taken Plavix today morning.  2.  History of TIA: Stable.  In 2005.  Can go off Plavix for now.  3.  Essential hypertension: Blood pressure stable.  Resume Benicar.  4.  Nausea vomiting with moderate dehydration: Lactic acidosis secondary to dehydration.  We will give further IV fluids.  Screening COVID-19 test is pending. Will keep n.p.o. until seen by surgery.   DVT prophylaxis: SCDs Code Status: Full code Family Communication: Wife at the bedside Disposition Plan: Depends upon surgical plan Consults called: General surgery called by ER Admission status: Observation.   Barb Merino MD [facility] Pager [phone]

## 2021-05-15 NOTE — ED Notes (Signed)
Critical lab reported to E.Rees  Lactic 2.3

## 2021-05-15 NOTE — ED Triage Notes (Signed)
Pt BIB EMS. Pt c/o upper abd pain for the last few days. Per EMS pt abd is distended. Pain 8/10.  EMS gave 4 zofran and 150mg Fent. Pt stated that helped his pain 2/10

## 2021-05-15 NOTE — Plan of Care (Signed)
  Problem: Coping: Goal: Level of anxiety will decrease Outcome: Progressing   Problem: Pain Managment: Goal: General experience of comfort will improve Outcome: Progressing   

## 2021-05-15 NOTE — ED Notes (Signed)
CRITICAL VALUE: Lactic acid 2.3  MD NOTIFIED: Quintella Reichert MD   TIME OF NOTIFICATION: 1310  RESPONSE: Acknowledged

## 2021-05-15 NOTE — ED Notes (Signed)
Surgery talking with pt at bedside.

## 2021-05-15 NOTE — ED Provider Notes (Signed)
[facility] DEPT Provider Note   CSN: [id number] Arrival date & time: 05/15/21  1125     History Chief Complaint  Patient presents with   Abdominal Pain    Timothy Duran is a 75 y.o. male.  The history is provided by the patient and medical records.  Abdominal Pain Timothy Duran is a 75 y.o. male who presents to the Emergency Department complaining of abdominal pain.  He presents to the ED by EMS complaining of abdominal pain that started at 4 AM this morning.  Has associated vomiting x 3.  Received 146mg fentanyl prior to ED arrival with improvement in sxs. No prior similar symptoms. He was feeling well before this began. No associated fever, diarrhea. He did have brief chest pain earlier today. No cough or difficulty breathing. He does have a remote history of sepsis due to a knee infection. He also has a history of prostate cancer status post prostatectomy, HTN.  No tobacco, alcohol, drugs.    Past Medical History:  Diagnosis Date   Abdominal hernia    "in there now" (05/20/2015)   Arthritis    "terrible; all over" (05/20/2015)   Basal cell carcinoma    "left neck"   Diverticulosis    Epistaxis 05/20/2015   hospitalized   Hypercholesterolemia    Hypertension    Kidney stone    [place]state cancer ([place]   prostatectomy   TIA (transient ischemic attack) 2005   ?    Patient Active Problem List   Diagnosis Date Noted   Cholelithiasis and cholecystitis with obstruction 05/15/2021   Mixed hyperlipidemia 02/23/2019   Vertigo 05/27/2017   BMI 28.0-28.9,adult 08/13/2015   Gastroesophageal reflux disease without esophagitis 05/13/2015   Hypertension 08/31/2013   TIA (transient ischemic attack) 08/31/2013    Past Surgical History:  Procedure Laterality Date   BASAL CELL CARCINOMA EXCISION Left 2015   "neck"   BLADDER STONE REMOVAL  09/2009   CYSTOSCOPY W/ STONE MANIPULATION  1994   "got one stone; still has one lodge in there" (05/20/2015)    INCISION AND DRAINAGE ABSCESS Left 06/20/2015   Procedure: INCISION AND DRAINAGE ABSCESS;  Surgeon: DMelina Schools MD;  Location: [facility];  Service: Orthopedics;  Laterality: Left;   KIDNEY STONE SURGERY  1995   KNEE ARTHROTOMY Left 07/20/2015   Procedure: KNEE ARTHROTOMY Incision and Drainage left knee;  Surgeon: MParalee Cancel MD;  Location: [facility];  Service: Orthopedics;  Laterality: Left;   NASAL ENDOSCOPY WITH EPISTAXIS CONTROL Left 05/21/2015   Procedure: NASAL ENDOSCOPY WITH EPISTAXIS CONTROL;  Surgeon: JMelissa Montane MD;  Location: [facility]  Service: ENT;  Laterality: Left;   PROSTATECTOMY  01/2010   SHOULDER SURGERY  11/2015   torn ligament   VIDEO ASSISTED THORACOSCOPY (VATS)/WEDGE RESECTION Right 10/2009   "upper; benign"       Family History  Problem Relation Age of Onset   Dementia Mother    Prostate cancer Father        eye, lung, prostate   Prostate cancer Brother    Colon cancer Sister     Social History   Tobacco Use   Smoking status: Former    Packs/day: 1.00    Years: 4.00    Pack years: 4.00    Types: Cigarettes    Quit date: 08/29/2008    Years since quitting: 12.7   Smokeless tobacco: Never  Vaping Use   Vaping Use: Never used  Substance Use Topics   Alcohol use:  No    Alcohol/week: 0.0 standard drinks   Drug use: No    Home Medications Prior to Admission medications   Medication Sig Start Date End Date Taking? Authorizing Provider  Bioflavonoid Products (ESTER C PO) Take 1 tablet by mouth daily.   Yes [provider]  Cholecalciferol (VITAMIN D3 PO) Take 1 tablet by mouth daily.   Yes [provider]  clopidogrel (PLAVIX) 75 MG tablet Take 1 tablet (75 mg total) by mouth daily. 12/23/20  Yes Martin, Mary-Margaret, FNP  MAGNESIUM PO Take 1 tablet by mouth daily.   Yes [provider]  MECLIZINE HCL PO Take 1 tablet by mouth daily as needed (dizziness, vertigo).   Yes [provider]  Multiple Vitamins-Minerals (ZINC PO) Take  1 tablet by mouth daily.   Yes [provider]  olmesartan-hydrochlorothiazide (BENICAR HCT) 20-12.5 MG tablet Take 1 tablet by mouth daily. Patient taking differently: Take 0.5 tablets by mouth daily. 12/23/20  Yes Martin, Mary-Margaret, FNP  triamcinolone (KENALOG) 0.1 % Apply 1 application topically 2 (two) times daily. Patient not taking: Reported on 05/15/2021 09/09/20   Chevis Pretty, FNP    Allergies    Other and Penicillins  Review of Systems   Review of Systems  Gastrointestinal:  Positive for abdominal pain.  All other systems reviewed and are negative.  Physical Exam Updated Vital Signs BP 136/62   Pulse 60   Temp 98 F (36.7 C) (Rectal)   Resp 18   SpO2 91%   Physical Exam Vitals and nursing note reviewed.  Constitutional:      Appearance: He is well-developed.  HENT:     Head: Normocephalic and atraumatic.  Cardiovascular:     Rate and Rhythm: Normal rate and regular rhythm.     Heart sounds: No murmur heard. Pulmonary:     Effort: Pulmonary effort is normal. No respiratory distress.     Breath sounds: Normal breath sounds.  Abdominal:     Palpations: Abdomen is soft.     Tenderness: There is no guarding or rebound.     Comments: There is a ventral midline abdominal wall hernia that is soft and easy to reduce. There is mild to moderate epigastric and RUQ tenderness   Musculoskeletal:        General: No tenderness.  Skin:    General: Skin is warm and dry.  Neurological:     Mental Status: He is alert and oriented to person, place, and time.  Psychiatric:        Behavior: Behavior normal.    ED Results / Procedures / Treatments   Labs (all labs ordered are listed, but only abnormal results are displayed) Labs Reviewed  LACTIC ACID, PLASMA - Abnormal; Notable for the following components:      Result Value   Lactic Acid, Venous 2.3 (*)    All other components within normal limits  LACTIC ACID, PLASMA - Abnormal; Notable for the  following components:   Lactic Acid, Venous 2.6 (*)    All other components within normal limits  COMPREHENSIVE METABOLIC PANEL - Abnormal; Notable for the following components:   Glucose, Bld 123 (*)    BUN 25 (*)    All other components within normal limits  CBC WITH DIFFERENTIAL/PLATELET - Abnormal; Notable for the following components:   WBC 11.5 (*)    Neutro Abs 9.9 (*)    All other components within normal limits  URINALYSIS, ROUTINE W REFLEX MICROSCOPIC - Abnormal; Notable for the following components:  APPearance CLOUDY (*)    Specific Gravity, Urine 1.033 (*)    All other components within normal limits  CULTURE, BLOOD (SINGLE)  URINE CULTURE  CULTURE, BLOOD (ROUTINE X 2)  CULTURE, BLOOD (ROUTINE X 2)  RESP PANEL BY RT-PCR (FLU A&B, COVID) ARPGX2  PROTIME-INR  APTT  LIPASE, BLOOD  LACTIC ACID, PLASMA  LACTIC ACID, PLASMA  BASIC METABOLIC PANEL  CBC  TROPONIN I (HIGH SENSITIVITY)  TROPONIN I (HIGH SENSITIVITY)    EKG EKG Interpretation  Date/Time:  Thursday May 15 2021 12:22:55 EDT Ventricular Rate:  61 PR Interval:  193 QRS Duration: 146 QT Interval:  466 QTC Calculation: 470 R Axis:   24 Text Interpretation: Sinus rhythm Left bundle branch block Confirmed by Quintella Reichert [phone]) on 05/15/2021 12:27:28 PM  Radiology CT Abdomen Pelvis W Contrast  Result Date: 05/15/2021 CLINICAL DATA:  Abdominal pain and fever over the last few days. Distended abdomen. EXAM: CT ABDOMEN AND PELVIS WITH CONTRAST TECHNIQUE: Multidetector CT imaging of the abdomen and pelvis was performed using the standard protocol following bolus administration of intravenous contrast. CONTRAST:  16m OMNIPAQUE IOHEXOL 350 MG/ML SOLN COMPARISON:  CT 10/14/2016. FINDINGS: Lower chest: Chronic Bochdalek's hernia on the right containing only fat. Lung bases are clear. Coronary artery calcification and aortic atherosclerotic calcification are noted in the lower chest. Hepatobiliary: Liver  parenchyma is normal. The gallbladder is somewhat distended but there are no calcified stones. No definite gallbladder inflammation. Consider ultrasound scan if there is primary concern regarding gallbladder pathology. Pancreas: Normal Spleen: Normal Adrenals/Urinary Tract: Adrenal glands are normal. Several small renal cysts on both sides. Nonobstructing 6 x 7 mm stone in the midportion the right kidney. No hydronephrosis. No stone along the course of either ureter. No stone in the bladder or prostate urethra. Stomach/Bowel: Stomach and small intestine are normal. The appendix is normal. The colon is normal. No evidence of diverticulosis or diverticulitis. No constipation. Vascular/Lymphatic: Aortic atherosclerosis. No aneurysm. IVC is normal. No adenopathy. Reproductive: Previous prostatectomy. Other: No free fluid or air. Musculoskeletal: Ordinary mild degenerative changes affect the spine. IMPRESSION: No acute finding by CT. The gallbladder is slightly distended, but there are no visible stones and there is no apparent surrounding inflammatory change by CT. Consider ultrasound if there is concern regarding gallbladder disease. Multiple liver cysts and small nonobstructing renal calculi, the largest on the right measuring 6 x 7 mm. Aortic Atherosclerosis (ICD10-I70.0). Apparent previous prostatectomy. No ascites.  Evidence of bowel obstruction or focal bowel pathology. Electronically Signed   By: MNelson ChimesM.D.   On: 05/15/2021 14:06   DG Chest Port 1 View  Result Date: 05/15/2021 CLINICAL DATA:  Sepsis. EXAM: PORTABLE CHEST 1 VIEW COMPARISON:  05/08/2015 FINDINGS: The heart size and mediastinal contours are within normal limits. Both lungs are clear. The visualized skeletal structures are unremarkable. IMPRESSION: No active disease. Electronically Signed   By: TKerby MoorsM.D.   On: 05/15/2021 12:42   UKoreaAbdomen Limited RUQ (LIVER/GB)  Result Date: 05/15/2021 CLINICAL DATA:  75year old male with  right upper quadrant abdominal pain. EXAM: ULTRASOUND ABDOMEN LIMITED RIGHT UPPER QUADRANT COMPARISON:  CT abdomen pelvis dated 05/15/2021 and right upper quadrant ultrasound dated 10/05/2016. FINDINGS: Gallbladder: Multiple stones within gallbladder. No gallbladder wall thickening or pericholecystic fluid. Negative sonographic Murphy's sign. Common bile duct: Diameter: 6 mm Liver: No focal lesion identified. Within normal limits in parenchymal echogenicity. Portal vein is patent on color Doppler imaging with normal direction of blood flow towards the liver. Other:  The right kidney demonstrates a slightly increased echogenicity. Several small cysts as well as a 2 cm stone is noted within the right kidney. These findings are better seen on the prior CT. IMPRESSION: 1. Cholelithiasis without sonographic evidence of acute cholecystitis. 2. Several small cysts as well as a stone in the right kidney. Electronically Signed   By: Anner Crete M.D.   On: 05/15/2021 16:03    Procedures Procedures   Medications Ordered in ED Medications  lactated ringers infusion ( Intravenous New Bag/Given 05/15/21 1704)  HYDROmorphone (DILAUDID) injection 0.5 mg (has no administration in time range)  acetaminophen (TYLENOL) tablet 650 mg (has no administration in time range)    Or  acetaminophen (TYLENOL) suppository 650 mg (has no administration in time range)  ondansetron (ZOFRAN) tablet 4 mg (has no administration in time range)    Or  ondansetron (ZOFRAN) injection 4 mg (has no administration in time range)  cefTRIAXone (ROCEPHIN) 2 g in sodium chloride 0.9 % 100 mL IVPB (has no administration in time range)  irbesartan (AVAPRO) tablet 75 mg (has no administration in time range)    And  hydrochlorothiazide 10 mg/mL oral suspension 6.25 mg (has no administration in time range)  lactated ringers bolus 500 mL (0 mLs Intravenous Stopped 05/15/21 1342)  HYDROmorphone (DILAUDID) injection 0.5 mg (0.5 mg Intravenous Given  05/15/21 1217)  iohexol (OMNIPAQUE) 350 MG/ML injection 85 mL (80 mLs Intravenous Contrast Given 05/15/21 1330)  HYDROmorphone (DILAUDID) injection 0.5 mg (0.5 mg Intravenous Given 05/15/21 1452)  pantoprazole (PROTONIX) injection 40 mg (40 mg Intravenous Given 05/15/21 1700)    ED Course  I have reviewed the triage vital signs and the nursing notes.  Pertinent labs & imaging results that were available during my care of the patient were reviewed by me and considered in my medical decision making (see chart for details).    MDM Rules/Calculators/A&P                          patient here for evaluation of abdominal pain and vomiting that started this morning. He has tenderness on examination without peritoneal findings. CT scan was obtained, which shows mildly distended gallbladder, no additional acute abnormalities. Pancreatic enzyme is within normal limits. CMP essentially within normal limits. CBC with mild leukocytosis. Right upper quadrant ultrasound was obtained given patient's ongoing pain. Ultrasound is significant for cholelithiasis, no evidence of cholecystitis. On reassessment patient has ongoing pain, required additional pain medications. Given his ongoing pain recommend admission for observation. General surgery consulted regarding concern for symptomatic biliary colic versus early cholecystitis. Medicine consulted for admission ongoing observation. Patient is in agreement with treatment plan.  Final Clinical Impression(s) / ED Diagnoses Final diagnoses:  RUQ abdominal pain    Rx / DC Orders ED Discharge Orders     None        Quintella Reichert, MD 05/15/21 [phone]

## 2021-05-15 NOTE — Telephone Encounter (Signed)
Spoke with daughter and she states that patient woke up this morning with pain in the center of his chest but went on to work. Once he got to work he started vomiting and went back home. Patient has no history of acid reflux. Advised daughter to take patient to ER for evaluation. Daughter agreed and verbalized understanding

## 2021-05-15 NOTE — Consult Note (Signed)
Consulting Physician: Nickola Major Yajahira Tison  Referring Provider: Dr. Ralene Bathe (ER provider)  Chief Complaint: Abdominal pain  Reason for Consult: Possible cholecystitis   Subjective   HPI: Timothy Duran is an 76 y.o. male who is here for abdominal pain.  Yesterday he ate some [employer] in the afternoon, then beef stew and key lime pie for dinner.  Overnight he developed severe abdominal pain, nausea and vomiting, prompting him to present to the hospital.  The pain is across the upper abdomen.  He has never experienced pain like this before.  He did have ulcers a long time ago, but that pain felt different.  He doesn't use NSAIDS.  He is a former smoker, but that was years ago.  Past Medical History:  Diagnosis Date   Abdominal hernia    "in there now" (05/20/2015)   Arthritis    "terrible; all over" (05/20/2015)   Basal cell carcinoma    "left neck"   Diverticulosis    Epistaxis 05/20/2015   hospitalized   Hypercholesterolemia    Hypertension    Kidney stone    Prostate cancer ([place]) 2010   prostatectomy   TIA (transient ischemic attack) 2005   ?    Past Surgical History:  Procedure Laterality Date   BASAL CELL CARCINOMA EXCISION Left 2015   "neck"   BLADDER STONE REMOVAL  09/2009   CYSTOSCOPY W/ STONE MANIPULATION  1994   "got one stone; still has one lodge in there" (05/20/2015)   INCISION AND DRAINAGE ABSCESS Left 06/20/2015   Procedure: INCISION AND DRAINAGE ABSCESS;  Surgeon: Melina Schools, MD;  Location: [facility];  Service: Orthopedics;  Laterality: Left;   KIDNEY STONE SURGERY  1995   KNEE ARTHROTOMY Left 07/20/2015   Procedure: KNEE ARTHROTOMY Incision and Drainage left knee;  Surgeon: Paralee Cancel, MD;  Location: [facility];  Service: Orthopedics;  Laterality: Left;   NASAL ENDOSCOPY WITH EPISTAXIS CONTROL Left 05/21/2015   Procedure: NASAL ENDOSCOPY WITH EPISTAXIS CONTROL;  Surgeon: Melissa Montane, MD;  Location: [facility];  Service: ENT;  Laterality: Left;    PROSTATECTOMY  01/2010   SHOULDER SURGERY  11/2015   torn ligament   VIDEO ASSISTED THORACOSCOPY (VATS)/WEDGE RESECTION Right 10/2009   "upper; benign"    Family History  Problem Relation Age of Onset   Dementia Mother    Prostate cancer Father        eye, lung, prostate   Prostate cancer Brother    Colon cancer Sister     Social:  reports that he quit smoking about 12 years ago. His smoking use included cigarettes. He has a 4.00 pack-year smoking history. He has never used smokeless tobacco. He reports that he does not drink alcohol and does not use drugs.  Allergies:  Allergies  Allergen Reactions   Other Other (See Comments)    Nasal packing- headaches and facial pain   Penicillins Other (See Comments)    Blisters  Patient states this was a childhood reaction    Medications: Current Outpatient Medications  Medication Instructions   Bioflavonoid Products (ESTER C PO) 1 tablet, Oral, Daily   Cholecalciferol (VITAMIN D3 PO) 1 tablet, Oral, Daily   clopidogrel (PLAVIX) 75 mg, Oral, Daily   MAGNESIUM PO 1 tablet, Oral, Daily   MECLIZINE HCL PO 1 tablet, Oral, Daily PRN   Multiple Vitamins-Minerals (ZINC PO) 1 tablet, Oral, Daily   olmesartan-hydrochlorothiazide (BENICAR HCT) 20-12.5 MG tablet 1 tablet, Oral, Daily   triamcinolone (KENALOG) 0.1 % 1  application, Topical, 2 times daily    ROS - all of the below systems have been reviewed with the patient and positives are indicated with bold text General: chills, fever or night sweats Eyes: blurry vision or double vision ENT: epistaxis or sore throat Allergy/Immunology: itchy/watery eyes or nasal congestion Hematologic/Lymphatic: bleeding problems, blood clots or swollen lymph nodes Endocrine: temperature intolerance or unexpected weight changes Breast: new or changing breast lumps or nipple discharge Resp: cough, shortness of breath, or wheezing CV: chest pain or dyspnea on exertion GI: as per HPI GU: dysuria, trouble  voiding, or hematuria MSK: joint pain or joint stiffness Neuro: TIA or stroke symptoms Derm: pruritus and skin lesion changes Psych: anxiety and depression  Objective   PE Blood pressure (!) 155/73, pulse 64, temperature 98 F (36.7 C), temperature source Rectal, resp. rate (!) 23, SpO2 93 %. Constitutional: NAD; conversant; no deformities Eyes: Moist conjunctiva; no lid lag; anicteric; PERRL Neck: Trachea midline; no thyromegaly Lungs: Normal respiratory effort; no tactile fremitus CV: RRR; no palpable thrills; no pitting edema GI: Abd Soft, diastasis in the midline, incisional hernia - reducible, vague epigastric abdominal pain a little worse over RUQ MSK: Normal range of motion of extremities; no clubbing/cyanosis Psychiatric: Appropriate affect; alert and oriented x3 Lymphatic: No palpable cervical or axillary lymphadenopathy  Results for orders placed or performed during the hospital encounter of 05/15/21 (from the past 24 hour(s))  Lactic acid, plasma     Status: Abnormal   Collection Time: 05/15/21 11:50 AM  Result Value Ref Range   Lactic Acid, Venous 2.3 (HH) 0.5 - 1.9 mmol/L  Comprehensive metabolic panel     Status: Abnormal   Collection Time: 05/15/21 11:50 AM  Result Value Ref Range   Sodium 141 135 - 145 mmol/L   Potassium 4.1 3.5 - 5.1 mmol/L   Chloride 105 98 - 111 mmol/L   CO2 24 22 - 32 mmol/L   Glucose, Bld 123 (H) 70 - 99 mg/dL   BUN 25 (H) 8 - 23 mg/dL   Creatinine, Ser 0.80 0.61 - 1.24 mg/dL   Calcium 10.3 8.9 - 10.3 mg/dL   Total Protein 6.9 6.5 - 8.1 g/dL   Albumin 4.3 3.5 - 5.0 g/dL   AST 20 15 - 41 U/L   ALT 21 0 - 44 U/L   Alkaline Phosphatase 67 38 - 126 U/L   Total Bilirubin 0.5 0.3 - 1.2 mg/dL   GFR, Estimated >60 >60 mL/min   Anion gap 12 5 - 15  CBC WITH DIFFERENTIAL     Status: Abnormal   Collection Time: 05/15/21 11:50 AM  Result Value Ref Range   WBC 11.5 (H) 4.0 - 10.5 K/uL   RBC 4.99 4.22 - 5.81 MIL/uL   Hemoglobin 15.7 13.0 -  17.0 g/dL   HCT 46.8 39.0 - 52.0 %   MCV 93.8 80.0 - 100.0 fL   MCH 31.5 26.0 - 34.0 pg   MCHC 33.5 30.0 - 36.0 g/dL   RDW 13.5 11.5 - 15.5 %   Platelets 242 150 - 400 K/uL   nRBC 0.0 0.0 - 0.2 %   Neutrophils Relative % 87 %   Neutro Abs 9.9 (H) 1.7 - 7.7 K/uL   Lymphocytes Relative 9 %   Lymphs Abs 1.1 0.7 - 4.0 K/uL   Monocytes Relative 3 %   Monocytes Absolute 0.4 0.1 - 1.0 K/uL   Eosinophils Relative 0 %   Eosinophils Absolute 0.0 0.0 - 0.5 K/uL   Basophils  Relative 1 %   Basophils Absolute 0.1 0.0 - 0.1 K/uL   Immature Granulocytes 0 %   Abs Immature Granulocytes 0.05 0.00 - 0.07 K/uL  Protime-INR     Status: None   Collection Time: 05/15/21 11:50 AM  Result Value Ref Range   Prothrombin Time 13.0 11.4 - 15.2 seconds   INR 1.0 0.8 - 1.2  APTT     Status: None   Collection Time: 05/15/21 11:50 AM  Result Value Ref Range   aPTT 27 24 - 36 seconds  Lipase, blood     Status: None   Collection Time: 05/15/21 11:50 AM  Result Value Ref Range   Lipase 32 11 - 51 U/L  Troponin I (High Sensitivity)     Status: None   Collection Time: 05/15/21 11:50 AM  Result Value Ref Range   Troponin I (High Sensitivity) 3 <18 ng/L  Lactic acid, plasma     Status: Abnormal   Collection Time: 05/15/21  1:50 PM  Result Value Ref Range   Lactic Acid, Venous 2.6 (HH) 0.5 - 1.9 mmol/L  Troponin I (High Sensitivity)     Status: None   Collection Time: 05/15/21  1:52 PM  Result Value Ref Range   Troponin I (High Sensitivity) 3 <18 ng/L  Urinalysis, Routine w reflex microscopic     Status: Abnormal   Collection Time: 05/15/21  2:56 PM  Result Value Ref Range   Color, Urine YELLOW YELLOW   APPearance CLOUDY (A) CLEAR   Specific Gravity, Urine 1.033 (H) 1.005 - 1.030   pH 8.0 5.0 - 8.0   Glucose, UA NEGATIVE NEGATIVE mg/dL   Hgb urine dipstick NEGATIVE NEGATIVE   Bilirubin Urine NEGATIVE NEGATIVE   Ketones, ur NEGATIVE NEGATIVE mg/dL   Protein, ur NEGATIVE NEGATIVE mg/dL   Nitrite  NEGATIVE NEGATIVE   Leukocytes,Ua NEGATIVE NEGATIVE  Lactic acid, plasma     Status: None   Collection Time: 05/15/21  4:37 PM  Result Value Ref Range   Lactic Acid, Venous 1.3 0.5 - 1.9 mmol/L     Imaging Orders  DG Chest Port 1 View  CT Abdomen Pelvis W Contrast  US Abdomen Limited RUQ (LIVER/GB)  NM Hepato W/EF    Assessment and Plan   Timothy Duran is an 75 y.o. male with epigastric abdominal pain found to have gallstones.  This may be acute cholecystitis, however the CT and ultrasound and patient's abdominal exam are not convincing.  He also has a history of peptic ulcer disease - though that was years ago and he denies any current major risk factors.  I recommend completing a HIDA scan.  In the meantime we should attempt to control his symptoms.  Please hold the plavix if possible.  If he does need urgent surgery for acute cholecystitis, and symptoms aren't resolving with medications we may need to consider proceeding with surgery without waiting on Plavix to wear off.  If his symptoms improve, we could wait 5 days for surgery and maybe schedule for more elective operation.  Surgery team will follow closely.     ICD-10-CM   1. RUQ abdominal pain  R10.11 US Abdomen Limited RUQ (LIVER/GB)    US Abdomen Limited RUQ (LIVER/GB)       Felicie Morn, MD  [facility] Use [email or web address] to contact on call provider

## 2021-05-16 ENCOUNTER — Observation Stay (HOSPITAL_COMMUNITY): Payer: Medicare PPO

## 2021-05-16 DIAGNOSIS — I447 Left bundle-branch block, unspecified: Secondary | ICD-10-CM | POA: Diagnosis present

## 2021-05-16 DIAGNOSIS — K8001 Calculus of gallbladder with acute cholecystitis with obstruction: Secondary | ICD-10-CM | POA: Diagnosis present

## 2021-05-16 DIAGNOSIS — Z85828 Personal history of other malignant neoplasm of skin: Secondary | ICD-10-CM | POA: Diagnosis not present

## 2021-05-16 DIAGNOSIS — Z79899 Other long term (current) drug therapy: Secondary | ICD-10-CM | POA: Diagnosis not present

## 2021-05-16 DIAGNOSIS — Z8042 Family history of malignant neoplasm of prostate: Secondary | ICD-10-CM | POA: Diagnosis not present

## 2021-05-16 DIAGNOSIS — Z8673 Personal history of transient ischemic attack (TIA), and cerebral infarction without residual deficits: Secondary | ICD-10-CM | POA: Diagnosis not present

## 2021-05-16 DIAGNOSIS — K219 Gastro-esophageal reflux disease without esophagitis: Secondary | ICD-10-CM

## 2021-05-16 DIAGNOSIS — K565 Intestinal adhesions [bands], unspecified as to partial versus complete obstruction: Secondary | ICD-10-CM | POA: Diagnosis present

## 2021-05-16 DIAGNOSIS — K819 Cholecystitis, unspecified: Secondary | ICD-10-CM | POA: Diagnosis not present

## 2021-05-16 DIAGNOSIS — Z885 Allergy status to narcotic agent status: Secondary | ICD-10-CM | POA: Diagnosis not present

## 2021-05-16 DIAGNOSIS — Z880 Allergy status to penicillin: Secondary | ICD-10-CM | POA: Diagnosis not present

## 2021-05-16 DIAGNOSIS — Z8546 Personal history of malignant neoplasm of prostate: Secondary | ICD-10-CM | POA: Diagnosis not present

## 2021-05-16 DIAGNOSIS — E782 Mixed hyperlipidemia: Secondary | ICD-10-CM | POA: Diagnosis present

## 2021-05-16 DIAGNOSIS — K8020 Calculus of gallbladder without cholecystitis without obstruction: Secondary | ICD-10-CM | POA: Diagnosis not present

## 2021-05-16 DIAGNOSIS — E872 Acidosis: Secondary | ICD-10-CM | POA: Diagnosis present

## 2021-05-16 DIAGNOSIS — I10 Essential (primary) hypertension: Secondary | ICD-10-CM | POA: Diagnosis present

## 2021-05-16 DIAGNOSIS — Z87891 Personal history of nicotine dependence: Secondary | ICD-10-CM | POA: Diagnosis not present

## 2021-05-16 DIAGNOSIS — K8019 Calculus of gallbladder with other cholecystitis with obstruction: Secondary | ICD-10-CM | POA: Diagnosis present

## 2021-05-16 DIAGNOSIS — K8012 Calculus of gallbladder with acute and chronic cholecystitis without obstruction: Secondary | ICD-10-CM | POA: Diagnosis not present

## 2021-05-16 DIAGNOSIS — E860 Dehydration: Secondary | ICD-10-CM | POA: Diagnosis present

## 2021-05-16 DIAGNOSIS — Z20822 Contact with and (suspected) exposure to covid-19: Secondary | ICD-10-CM | POA: Diagnosis present

## 2021-05-16 DIAGNOSIS — Z7902 Long term (current) use of antithrombotics/antiplatelets: Secondary | ICD-10-CM | POA: Diagnosis not present

## 2021-05-16 LAB — BASIC METABOLIC PANEL
Anion gap: 9 (ref 5–15)
BUN: 20 mg/dL (ref 8–23)
CO2: 23 mmol/L (ref 22–32)
Calcium: 9.6 mg/dL (ref 8.9–10.3)
Chloride: 104 mmol/L (ref 98–111)
Creatinine, Ser: 0.78 mg/dL (ref 0.61–1.24)
GFR, Estimated: >60 mL/min (ref >60)
Glucose, Bld: 120 mg/dL — ABNORMAL HIGH (ref 70–99)
Potassium: 4.2 mmol/L (ref 3.5–5.1)
Sodium: 136 mmol/L (ref 135–145)

## 2021-05-16 LAB — CBC
HCT: 44.5 % (ref 39.0–52.0)
Hemoglobin: 15 g/dL (ref 13.0–17.0)
MCH: 31.8 pg (ref 26.0–34.0)
MCHC: 33.7 g/dL (ref 30.0–36.0)
MCV: 94.5 fL (ref 80.0–100.0)
Platelets: 213 10*3/uL (ref 150–400)
RBC: 4.71 MIL/uL (ref 4.22–5.81)
RDW: 13.9 % (ref 11.5–15.5)
WBC: 16.6 10*3/uL — ABNORMAL HIGH (ref 4.0–10.5)
nRBC: 0 % (ref 0.0–0.2)

## 2021-05-16 LAB — HEPATIC FUNCTION PANEL
ALT: 18 U/L (ref 0–44)
AST: 17 U/L (ref 15–41)
Albumin: 3.5 g/dL (ref 3.5–5.0)
Alkaline Phosphatase: 60 U/L (ref 38–126)
Bilirubin, Direct: 0.1 mg/dL (ref 0.0–0.2)
Indirect Bilirubin: 0.6 mg/dL (ref 0.3–0.9)
Total Bilirubin: 0.7 mg/dL (ref 0.3–1.2)
Total Protein: 6 g/dL — ABNORMAL LOW (ref 6.5–8.1)

## 2021-05-16 LAB — URINE CULTURE: Culture: <10000 — AB

## 2021-05-16 MED ORDER — TECHNETIUM TC 99M MEBROFENIN IV KIT
5.0000 | PACK | Freq: Once | INTRAVENOUS | Status: AC
  Administered 2021-05-16: 5 via INTRAVENOUS

## 2021-05-16 MED ORDER — FAMOTIDINE IN NACL 20-0.9 MG/50ML-% IV SOLN
20.0000 mg | INTRAVENOUS | Status: DC
  Administered 2021-05-16 – 2021-05-17 (×2): 20 mg via INTRAVENOUS
  Filled 2021-05-16 (×2): qty 50

## 2021-05-16 MED ORDER — DEXTROSE IN LACTATED RINGERS 5 % IV SOLN: INTRAVENOUS | Status: DC

## 2021-05-16 MED ORDER — MORPHINE SULFATE (PF) 4 MG/ML IV SOLN
3.0000 mg | Freq: Once | INTRAVENOUS | Status: AC
  Administered 2021-05-16: 3 mg via INTRAVENOUS
  Filled 2021-05-16: qty 1

## 2021-05-16 NOTE — Progress Notes (Signed)
PROGRESS NOTE    Timothy Duran  [id number] DOB: 24-Mar-1946 DOA: 05/15/2021 PCP: Timothy Pretty, FNP    Brief Narrative:  Timothy Duran was admitted to the hospital with the working diagnosis of cholelithiasis and cholecystitis.   75 year old male with a past medical history for TIA, and hypertension who presented with abdominal pain, distention and vomiting.  Reported multiple episodes of vomiting for about 12 hours, associated with diffuse epigastric abdominal pain.  On his initial physical examination blood pressure 161/82, heart rate 64, respiratory rate 17, oxygen saturation 93%.  His lungs were clear to auscultation bilaterally, heart S1-S2, present, rhythmic, soft abdomen, diffuse tenderness along the epigastrium without guarding or rigidity, no lower extremity edema.  Sodium 141, potassium 4.1, chloride 105, bicarb 24, glucose 123, BUN 25, creatinine 0.8.,  White count 11.5, hemoglobin 15.7, hematocrit 46.8, platelets 242. SARS COVID-19 negative.  Urinalysis specific gravity 1.033.  CT of the abdomen with distended gallbladder but no visible stones.  Chest radiograph no infiltrates.  EKG 61 bpm, normal axis, left bundle branch block, sinus rhythm with poor R wave progression, no ST segment or T wave changes.  Assessment & Plan:   Principal Problem:   Cholelithiasis and cholecystitis without obstruction Active Problems:   Hypertension   Gastroesophageal reflux disease without esophagitis   Mixed hyperlipidemia   Acute cholecystitis. Patient continue to have right upper quadrant abdominal pain, no  nausea or vomiting. He has been NPO.  HIDA scan with non filling of the gallbladder consistent with cystic duct obstruction. Positive signs of cholecystitis. Continue pain control with IV hydromorphone and will add prophylactic antibiotic therapy with ceftriaxone.  Continue with supportive IV fluids and as needed antiemetics. Add antiacid therapy with famotidine.  Follow  with surgery recommendations.   2. HTN. Blood pressure well controlled, continue close monitoring.   3. Dyslipidemia/ TIA. Continue with statin therapy when resumes po intake.  Continue to hold on clopidogrel.   Patient continue to be at high risk for worsening cholecystitis   Status is: Observation  The patient will require care spanning > 2 midnights and should be moved to inpatient because: Inpatient level of care appropriate due to severity of illness  Dispo: The patient is from: Home              Anticipated d/c is to: Home              Patient currently is not medically stable to d/c.   Difficult to place patient No   DVT prophylaxis: Enoxaparin   Code Status:   full  Family Communication:  I spoke with patient's wife and daugher at the bedside, we talked in detail about patient's condition, plan of care and prognosis and all questions were addressed.      Consultants:  Surgery    Antimicrobials:  Ceftriaxone     Subjective: Patient continue to have moderate to severe right upper quadrant abdominal pian, no nausea or vomiting. He has been npo  Objective: Vitals:   05/15/21 2000 05/15/21 2121 05/16/21 0140 05/16/21 0522  BP: (!) 146/72 128/64 122/60 127/65  Pulse: 80 95 88 80  Resp: (!) '21 17 18 16  '$ Temp:  98.9 F (37.2 C) 98.4 F (36.9 C) 98.5 F (36.9 C)  TempSrc:  Oral Oral Oral  SpO2: 90% 90% 91% (!) 89%  Weight:  74.8 kg    Height:  '5\' 8"'$  (1.727 m)      Intake/Output Summary (Last 24 hours) at 05/16/2021 1516 Last data  filed at 05/16/2021 0300 Gross per 24 hour  Intake 1120 ml  Output 200 ml  Net 920 ml   Filed Weights   05/15/21 2121  Weight: 74.8 kg    Examination:   General: deconditioned and in pain,.  Neurology: Awake and alert, non focal  E ENT: no pallor, no icterus, oral mucosa moist Cardiovascular: No JVD. S1-S2 present, rhythmic, no gallops, rubs, or murmurs. No lower extremity edema. Pulmonary: vesicular breath sounds  bilaterally, adequate air movement, no wheezing, rhonchi or rales. Gastrointestinal. Abdomen mild distended, tender to superficial  palpation at the right upper quadrant,  Skin. No rashes Musculoskeletal: no joint deformities     Data Reviewed: I have personally reviewed following labs and imaging studies  CBC: Recent Labs  Lab 05/15/21 1150 05/16/21 0337  WBC 11.5* 16.6*  NEUTROABS 9.9*  --   HGB 15.7 15.0  HCT 46.8 44.5  MCV 93.8 94.5  PLT 242 [id number]   Basic Metabolic Panel: Recent Labs  Lab 05/15/21 1150 05/16/21 0337  NA 141 136  K 4.1 4.2  CL 105 104  CO2 24 23  GLUCOSE 123* 120*  BUN 25* 20  CREATININE 0.80 0.78  CALCIUM 10.3 9.6   GFR: Estimated Creatinine Clearance: 74.8 mL/min (by C-G formula based on SCr of 0.78 mg/dL). Liver Function Tests: Recent Labs  Lab 05/15/21 1150 05/16/21 0337  AST 20 17  ALT 21 18  ALKPHOS 67 60  BILITOT 0.5 0.7  PROT 6.9 6.0*  ALBUMIN 4.3 3.5   Recent Labs  Lab 05/15/21 1150  LIPASE 32   No results for input(s): AMMONIA in the last 168 hours. Coagulation Profile: Recent Labs  Lab 05/15/21 1150  INR 1.0   Cardiac Enzymes: No results for input(s): CKTOTAL, CKMB, CKMBINDEX, TROPONINI in the last 168 hours. BNP (last 3 results) No results for input(s): PROBNP in the last 8760 hours. HbA1C: No results for input(s): HGBA1C in the last 72 hours. CBG: No results for input(s): GLUCAP in the last 168 hours. Lipid Profile: No results for input(s): CHOL, HDL, LDLCALC, TRIG, CHOLHDL, LDLDIRECT in the last 72 hours. Thyroid Function Tests: No results for input(s): TSH, T4TOTAL, FREET4, T3FREE, THYROIDAB in the last 72 hours. Anemia Panel: No results for input(s): VITAMINB12, FOLATE, FERRITIN, TIBC, IRON, RETICCTPCT in the last 72 hours.    Radiology Studies: I have reviewed all of the imaging during this hospital visit personally     Scheduled Meds:  irbesartan  75 mg Oral Daily   And   hydrochlorothiazide   6.25 mg Oral Daily   Continuous Infusions:  cefTRIAXone (ROCEPHIN)  IV Stopped (05/15/21 1806)   lactated ringers 125 mL/hr at 05/16/21 0419     LOS: 0 days        Timothy Hornsby Gerome Apley, MD

## 2021-05-16 NOTE — Plan of Care (Signed)
  Problem: Nutrition: Goal: Adequate nutrition will be maintained Outcome: Progressing   Problem: Activity: Goal: Risk for activity intolerance will decrease Outcome: Progressing   Problem: Pain Managment: Goal: General experience of comfort will improve Outcome: Progressing   

## 2021-05-16 NOTE — Plan of Care (Signed)
  Problem: Education: Goal: Knowledge of General Education information will improve Description: Including pain rating scale, medication(s)/side effects and non-pharmacologic comfort measures Outcome: Progressing   Problem: Elimination: Goal: Will not experience complications related to urinary retention Outcome: Progressing   Problem: Pain Managment: Goal: General experience of comfort will improve Outcome: Progressing   

## 2021-05-16 NOTE — Progress Notes (Signed)
Subjective: CC: Tmax 100 overnight. Reports pain in epigastrium and ruq was 10/10 yesterday. Resolved this am except with palpation. No n/v. Lactic celared. WBC 11.5 > 16.6. No LFT's obtained.   Objective: Vital signs in last 24 hours: Temp:  [98 F (36.7 C)-100 F (37.8 C)] 98.5 F (36.9 C) (07/29 0522) Pulse Rate:  [59-95] 80 (07/29 0522) Resp:  [16-23] 16 (07/29 0522) BP: (122-161)/(60-91) 127/65 (07/29 0522) SpO2:  [89 %-98 %] 89 % (07/29 0522) Weight:  [74.8 kg] 74.8 kg (07/28 2121) Last BM Date: 05/15/21  Intake/Output from previous day: 07/28 0701 - 07/29 0700 In: 1120 [P.O.:120; I.V.:1000] Out: 200 [Urine:200] Intake/Output this shift: No intake/output data recorded.  PE: Gen:  Alert, NAD, pleasant Card:  Reg Pulm:  Normal rate and effort  Abd: Soft, diastasis recti, epigastric and ruq tenderness, +BS Ext:  No LE edema Psych: A&Ox3  Skin: no rashes noted, warm and dry  Lab Results:  Recent Labs    05/15/21 1150 05/16/21 0337  WBC 11.5* 16.6*  HGB 15.7 15.0  HCT 46.8 44.5  PLT 242 213   BMET Recent Labs    05/15/21 1150 05/16/21 0337  NA 141 136  K 4.1 4.2  CL 105 104  CO2 24 23  GLUCOSE 123* 120*  BUN 25* 20  CREATININE 0.80 0.78  CALCIUM 10.3 9.6   PT/INR Recent Labs    05/15/21 1150  LABPROT 13.0  INR 1.0   CMP     Component Value Date/Time   NA 136 05/16/2021 0337   NA 141 12/23/2020 0900   K 4.2 05/16/2021 0337   CL 104 05/16/2021 0337   CO2 23 05/16/2021 0337   GLUCOSE 120 (H) 05/16/2021 0337   BUN 20 05/16/2021 0337   BUN 18 12/23/2020 0900   CREATININE 0.78 05/16/2021 0337   CALCIUM 9.6 05/16/2021 0337   PROT 6.9 05/15/2021 1150   PROT 6.3 12/23/2020 0900   ALBUMIN 4.3 05/15/2021 1150   ALBUMIN 4.3 12/23/2020 0900   AST 20 05/15/2021 1150   ALT 21 05/15/2021 1150   ALKPHOS 67 05/15/2021 1150   BILITOT 0.5 05/15/2021 1150   BILITOT 0.3 12/23/2020 0900   GFRNONAA >60 05/16/2021 0337   GFRAA 91 08/24/2018  1131   Lipase     Component Value Date/Time   LIPASE 32 05/15/2021 1150    Studies/Results: CT Abdomen Pelvis W Contrast  Result Date: 05/15/2021 CLINICAL DATA:  Abdominal pain and fever over the last few days. Distended abdomen. EXAM: CT ABDOMEN AND PELVIS WITH CONTRAST TECHNIQUE: Multidetector CT imaging of the abdomen and pelvis was performed using the standard protocol following bolus administration of intravenous contrast. CONTRAST:  34m OMNIPAQUE IOHEXOL 350 MG/ML SOLN COMPARISON:  CT 10/14/2016. FINDINGS: Lower chest: Chronic Bochdalek's hernia on the right containing only fat. Lung bases are clear. Coronary artery calcification and aortic atherosclerotic calcification are noted in the lower chest. Hepatobiliary: Liver parenchyma is normal. The gallbladder is somewhat distended but there are no calcified stones. No definite gallbladder inflammation. Consider ultrasound scan if there is primary concern regarding gallbladder pathology. Pancreas: Normal Spleen: Normal Adrenals/Urinary Tract: Adrenal glands are normal. Several small renal cysts on both sides. Nonobstructing 6 x 7 mm stone in the midportion the right kidney. No hydronephrosis. No stone along the course of either ureter. No stone in the bladder or prostate urethra. Stomach/Bowel: Stomach and small intestine are normal. The appendix is normal. The colon is normal. No evidence of diverticulosis  or diverticulitis. No constipation. Vascular/Lymphatic: Aortic atherosclerosis. No aneurysm. IVC is normal. No adenopathy. Reproductive: Previous prostatectomy. Other: No free fluid or air. Musculoskeletal: Ordinary mild degenerative changes affect the spine. IMPRESSION: No acute finding by CT. The gallbladder is slightly distended, but there are no visible stones and there is no apparent surrounding inflammatory change by CT. Consider ultrasound if there is concern regarding gallbladder disease. Multiple liver cysts and small nonobstructing renal  calculi, the largest on the right measuring 6 x 7 mm. Aortic Atherosclerosis (ICD10-I70.0). Apparent previous prostatectomy. No ascites.  Evidence of bowel obstruction or focal bowel pathology. Electronically Signed   By: Nelson Chimes M.D.   On: 05/15/2021 14:06   DG Chest Port 1 View  Result Date: 05/15/2021 CLINICAL DATA:  Sepsis. EXAM: PORTABLE CHEST 1 VIEW COMPARISON:  05/08/2015 FINDINGS: The heart size and mediastinal contours are within normal limits. Both lungs are clear. The visualized skeletal structures are unremarkable. IMPRESSION: No active disease. Electronically Signed   By: Kerby Moors M.D.   On: 05/15/2021 12:42   US Abdomen Limited RUQ (LIVER/GB)  Result Date: 05/15/2021 CLINICAL DATA:  75 year old male with right upper quadrant abdominal pain. EXAM: ULTRASOUND ABDOMEN LIMITED RIGHT UPPER QUADRANT COMPARISON:  CT abdomen pelvis dated 05/15/2021 and right upper quadrant ultrasound dated 10/05/2016. FINDINGS: Gallbladder: Multiple stones within gallbladder. No gallbladder wall thickening or pericholecystic fluid. Negative sonographic Murphy's sign. Common bile duct: Diameter: 6 mm Liver: No focal lesion identified. Within normal limits in parenchymal echogenicity. Portal vein is patent on color Doppler imaging with normal direction of blood flow towards the liver. Other: The right kidney demonstrates a slightly increased echogenicity. Several small cysts as well as a 2 cm stone is noted within the right kidney. These findings are better seen on the prior CT. IMPRESSION: 1. Cholelithiasis without sonographic evidence of acute cholecystitis. 2. Several small cysts as well as a stone in the right kidney. Electronically Signed   By: Anner Crete M.D.   On: 05/15/2021 16:03    Anti-infectives: Anti-infectives (From admission, onward)    Start     Dose/Rate Route Frequency Ordered Stop   05/15/21 1800  cefTRIAXone (ROCEPHIN) 2 g in sodium chloride 0.9 % 100 mL IVPB        2 g 200  mL/hr over 30 Minutes Intravenous Every 24 hours 05/15/21 1701          Assessment/Plan Epigastric/RUQ abdominal pain Cholelithiasis  - CT 7/28 w/ slightly distended gallbladder but no visible stones and no apparent surrounding inflammatory changes  - RUQ [place] 7/28 w/ Cholelithiasis without sonographic evidence of acute cholecystitis. WBC 11.5 on admission, LFT's wnl - Continues to have epigastric tenderness on exam. WBC up from 11.5 > 16.6. No LFT's obtained. I have ordered these for this morning.  - Continue w/ plan for HIDA to r/o Acute Cholecystitis. Can cover with abx for now. On IV Rocephin.  - Cont to hold plavix  - Further recs to follow pending HIDA results   FEN - NPO, IVF per [facility] VTE - SCDs, hold plavix, okay for chemical prophylaxis from a general surgery standpoint ID - Rocephin  Hx of TIA in 2005 on Plavix at home HTN    LOS: 0 days    Jillyn Ledger , [facility] 05/16/2021, 10:00 AM Please see Amion for pager number during day hours 7:00am-4:30pm

## 2021-05-17 ENCOUNTER — Inpatient Hospital Stay (HOSPITAL_COMMUNITY): Payer: Medicare PPO | Admitting: Registered Nurse

## 2021-05-17 ENCOUNTER — Encounter (HOSPITAL_COMMUNITY)
Admission: EM | Disposition: A | Discharge status: Home Health | Payer: Self-pay | Source: Home / Self Care | Attending: Internal Medicine

## 2021-05-17 ENCOUNTER — Encounter (HOSPITAL_COMMUNITY): Payer: Self-pay | Admitting: Internal Medicine

## 2021-05-17 DIAGNOSIS — K8012 Calculus of gallbladder with acute and chronic cholecystitis without obstruction: Secondary | ICD-10-CM | POA: Diagnosis not present

## 2021-05-17 DIAGNOSIS — K219 Gastro-esophageal reflux disease without esophagitis: Secondary | ICD-10-CM | POA: Diagnosis not present

## 2021-05-17 DIAGNOSIS — I10 Essential (primary) hypertension: Secondary | ICD-10-CM | POA: Diagnosis not present

## 2021-05-17 HISTORY — PX: CHOLECYSTECTOMY: SHX55

## 2021-05-17 LAB — CBC
HCT: 43.1 % (ref 39.0–52.0)
Hemoglobin: 14.4 g/dL (ref 13.0–17.0)
MCH: 32.3 pg (ref 26.0–34.0)
MCHC: 33.4 g/dL (ref 30.0–36.0)
MCV: 96.6 fL (ref 80.0–100.0)
Platelets: 189 10*3/uL (ref 150–400)
RBC: 4.46 MIL/uL (ref 4.22–5.81)
RDW: 13.8 % (ref 11.5–15.5)
WBC: 13.7 10*3/uL — ABNORMAL HIGH (ref 4.0–10.5)
nRBC: 0 % (ref 0.0–0.2)

## 2021-05-17 LAB — BASIC METABOLIC PANEL
Anion gap: 5 (ref 5–15)
BUN: 17 mg/dL (ref 8–23)
CO2: 26 mmol/L (ref 22–32)
Calcium: 8.9 mg/dL (ref 8.9–10.3)
Chloride: 103 mmol/L (ref 98–111)
Creatinine, Ser: 0.71 mg/dL (ref 0.61–1.24)
GFR, Estimated: >60 mL/min (ref >60)
Glucose, Bld: 131 mg/dL — ABNORMAL HIGH (ref 70–99)
Potassium: 4.1 mmol/L (ref 3.5–5.1)
Sodium: 134 mmol/L — ABNORMAL LOW (ref 135–145)

## 2021-05-17 LAB — SURGICAL PCR SCREEN
MRSA, PCR: NEGATIVE
Staphylococcus aureus: NEGATIVE

## 2021-05-17 SURGERY — LAPAROSCOPIC CHOLECYSTECTOMY WITH INTRAOPERATIVE CHOLANGIOGRAM
Anesthesia: General | Site: Abdomen

## 2021-05-17 MED ORDER — SUCCINYLCHOLINE CHLORIDE 200 MG/10ML IV SOSY
PREFILLED_SYRINGE | INTRAVENOUS | Status: AC
  Filled 2021-05-17: qty 10

## 2021-05-17 MED ORDER — FENTANYL CITRATE (PF) 100 MCG/2ML IJ SOLN
INTRAMUSCULAR | Status: AC
  Filled 2021-05-17: qty 2

## 2021-05-17 MED ORDER — LABETALOL HCL 5 MG/ML IV SOLN
INTRAVENOUS | Status: DC | PRN
  Administered 2021-05-17 (×2): 2.5 mg via INTRAVENOUS

## 2021-05-17 MED ORDER — FENTANYL CITRATE (PF) 250 MCG/5ML IJ SOLN
INTRAMUSCULAR | Status: DC | PRN
  Administered 2021-05-17 (×2): 100 ug via INTRAVENOUS
  Administered 2021-05-17 (×3): 50 ug via INTRAVENOUS
  Administered 2021-05-17: 100 ug via INTRAVENOUS

## 2021-05-17 MED ORDER — PROPOFOL 10 MG/ML IV BOLUS
INTRAVENOUS | Status: DC | PRN
  Administered 2021-05-17: 90 mg via INTRAVENOUS

## 2021-05-17 MED ORDER — OXYCODONE HCL 5 MG PO TABS: 5.0000 mg | ORAL_TABLET | Freq: Once | ORAL | Status: DC | PRN

## 2021-05-17 MED ORDER — DEXAMETHASONE SODIUM PHOSPHATE 10 MG/ML IJ SOLN
INTRAMUSCULAR | Status: AC
  Filled 2021-05-17: qty 1

## 2021-05-17 MED ORDER — FENTANYL CITRATE (PF) 250 MCG/5ML IJ SOLN
INTRAMUSCULAR | Status: AC
  Filled 2021-05-17: qty 5

## 2021-05-17 MED ORDER — ROCURONIUM BROMIDE 10 MG/ML (PF) SYRINGE
PREFILLED_SYRINGE | INTRAVENOUS | Status: AC
  Filled 2021-05-17: qty 10

## 2021-05-17 MED ORDER — DEXAMETHASONE SODIUM PHOSPHATE 10 MG/ML IJ SOLN
INTRAMUSCULAR | Status: DC | PRN
  Administered 2021-05-17: 10 mg via INTRAVENOUS

## 2021-05-17 MED ORDER — SODIUM CHLORIDE 0.9 % IV SOLN
INTRAVENOUS | Status: AC
  Filled 2021-05-17: qty 2

## 2021-05-17 MED ORDER — ROCURONIUM BROMIDE 10 MG/ML (PF) SYRINGE
PREFILLED_SYRINGE | INTRAVENOUS | Status: DC | PRN
  Administered 2021-05-17: 40 mg via INTRAVENOUS

## 2021-05-17 MED ORDER — SUCCINYLCHOLINE CHLORIDE 200 MG/10ML IV SOSY
PREFILLED_SYRINGE | INTRAVENOUS | Status: DC | PRN
  Administered 2021-05-17: 140 mg via INTRAVENOUS

## 2021-05-17 MED ORDER — MIDAZOLAM HCL 2 MG/2ML IJ SOLN
INTRAMUSCULAR | Status: AC
  Filled 2021-05-17: qty 2

## 2021-05-17 MED ORDER — ONDANSETRON HCL 4 MG/2ML IJ SOLN: 4.0000 mg | Freq: Four times a day (QID) | INTRAMUSCULAR | Status: DC | PRN

## 2021-05-17 MED ORDER — CHLORHEXIDINE GLUCONATE CLOTH 2 % EX PADS
6.0000 | MEDICATED_PAD | Freq: Once | CUTANEOUS | Status: AC
  Administered 2021-05-17: 6 via TOPICAL

## 2021-05-17 MED ORDER — ONDANSETRON HCL 4 MG/2ML IJ SOLN
INTRAMUSCULAR | Status: AC
  Filled 2021-05-17: qty 2

## 2021-05-17 MED ORDER — 0.9 % SODIUM CHLORIDE (POUR BTL) OPTIME
TOPICAL | Status: DC | PRN
  Administered 2021-05-17: 1000 mL

## 2021-05-17 MED ORDER — OXYCODONE HCL 5 MG/5ML PO SOLN: 5.0000 mg | Freq: Once | ORAL | Status: DC | PRN

## 2021-05-17 MED ORDER — FENTANYL CITRATE (PF) 100 MCG/2ML IJ SOLN
25.0000 ug | INTRAMUSCULAR | Status: DC | PRN
  Administered 2021-05-17: 50 ug via INTRAVENOUS

## 2021-05-17 MED ORDER — LIDOCAINE 2% (20 MG/ML) 5 ML SYRINGE
INTRAMUSCULAR | Status: DC | PRN
  Administered 2021-05-17: 60 mg via INTRAVENOUS

## 2021-05-17 MED ORDER — CEFAZOLIN SODIUM-DEXTROSE 2-3 GM-%(50ML) IV SOLR
INTRAVENOUS | Status: DC | PRN
  Administered 2021-05-17: 2 g via INTRAVENOUS

## 2021-05-17 MED ORDER — LACTATED RINGERS IR SOLN
Status: DC | PRN
  Administered 2021-05-17 (×2): 1000 mL

## 2021-05-17 MED ORDER — MENTHOL 3 MG MT LOZG
1.0000 | LOZENGE | OROMUCOSAL | Status: DC | PRN
  Administered 2021-05-18: 3 mg via ORAL
  Filled 2021-05-17: qty 9

## 2021-05-17 MED ORDER — MIDAZOLAM HCL 5 MG/5ML IJ SOLN
INTRAMUSCULAR | Status: DC | PRN
  Administered 2021-05-17: 2 mg via INTRAVENOUS

## 2021-05-17 MED ORDER — BUPIVACAINE-EPINEPHRINE (PF) 0.25% -1:200000 IJ SOLN
INTRAMUSCULAR | Status: AC
  Filled 2021-05-17: qty 30

## 2021-05-17 MED ORDER — PHENOL 1.4 % MT LIQD: 1.0000 | OROMUCOSAL | Status: DC | PRN

## 2021-05-17 MED ORDER — BUPIVACAINE-EPINEPHRINE 0.25% -1:200000 IJ SOLN
INTRAMUSCULAR | Status: DC | PRN
  Administered 2021-05-17: 20 mL

## 2021-05-17 MED ORDER — LACTATED RINGERS IV SOLN: INTRAVENOUS | Status: DC | PRN

## 2021-05-17 MED ORDER — ONDANSETRON HCL 4 MG/2ML IJ SOLN
INTRAMUSCULAR | Status: DC | PRN
  Administered 2021-05-17: 4 mg via INTRAVENOUS

## 2021-05-17 MED ORDER — LIDOCAINE 2% (20 MG/ML) 5 ML SYRINGE
INTRAMUSCULAR | Status: AC
  Filled 2021-05-17: qty 5

## 2021-05-17 SURGICAL SUPPLY — 60 items
ADH SKN CLS APL DERMABOND .7 (GAUZE/BANDAGES/DRESSINGS) ×1
APL PRP STRL LF DISP 70% ISPRP (MISCELLANEOUS) ×1
APL SWBSTK 6 STRL LF DISP (MISCELLANEOUS)
APPLICATOR COTTON TIP 6 STRL (MISCELLANEOUS) IMPLANT
APPLICATOR COTTON TIP 6IN STRL (MISCELLANEOUS) IMPLANT
APPLIER CLIP 5 13 M/L LIGAMAX5 (MISCELLANEOUS) ×2
APR CLP MED LRG 5 ANG JAW (MISCELLANEOUS) ×1
BAG COUNTER SPONGE SURGICOUNT (BAG) IMPLANT
BAG SPEC RTRVL LRG 6X4 10 (ENDOMECHANICALS) ×1
BAG SPNG CNTER NS LX DISP (BAG)
CABLE HIGH FREQUENCY MONO STRZ (ELECTRODE) IMPLANT
CHLORAPREP W/TINT 26 (MISCELLANEOUS) ×2 IMPLANT
CLIP APPLIE 5 13 M/L LIGAMAX5 (MISCELLANEOUS) ×1 IMPLANT
CLIPPER FILTER TUBING DISPOS (CLIP) ×2
COVER MAYO STAND STRL (DRAPES) IMPLANT
COVER SURGICAL LIGHT HANDLE (MISCELLANEOUS) ×2 IMPLANT
DECANTER SPIKE VIAL GLASS SM (MISCELLANEOUS) ×2 IMPLANT
DERMABOND ADVANCED (GAUZE/BANDAGES/DRESSINGS) ×1
DERMABOND ADVANCED .7 DNX12 (GAUZE/BANDAGES/DRESSINGS) ×1 IMPLANT
DRAIN CHANNEL 19F RND (DRAIN) ×1 IMPLANT
DRAPE C-ARM 42X120 X-RAY (DRAPES) IMPLANT
DRAPE UTILITY XL STRL (DRAPES) ×2 IMPLANT
DRSG TEGADERM 4X4.75 (GAUZE/BANDAGES/DRESSINGS) ×1 IMPLANT
ELECT L-HOOK LAP 45CM DISP (ELECTROSURGICAL)
ELECT PENCIL ROCKER SW 15FT (MISCELLANEOUS) ×2 IMPLANT
ELECT REM PT RETURN 15FT ADLT (MISCELLANEOUS) ×2 IMPLANT
ELECTRODE L-HOOK LAP 45CM DISP (ELECTROSURGICAL) IMPLANT
EVACUATOR SILICONE 100CC (DRAIN) ×1 IMPLANT
FILTER TUBING DISPOS CLIPPER (CLIP) IMPLANT
GLOVE SURG POLYISO LF SZ5.5 (GLOVE) ×2 IMPLANT
GLOVE SURG UNDER POLY LF SZ6 (GLOVE) ×2 IMPLANT
GOWN STRL REUS W/TWL LRG LVL3 (GOWN DISPOSABLE) ×2 IMPLANT
GOWN STRL REUS W/TWL XL LVL3 (GOWN DISPOSABLE) ×4 IMPLANT
GRASPER SUT TROCAR 14GX15 (MISCELLANEOUS) IMPLANT
HEMOSTAT ARISTA ABSORB 3G PWDR (HEMOSTASIS) ×1 IMPLANT
HEMOSTAT SNOW SURGICEL 2X4 (HEMOSTASIS) IMPLANT
HEMOSTAT SURGICEL 2X14 (HEMOSTASIS) ×1 IMPLANT
KIT BASIN OR (CUSTOM PROCEDURE TRAY) ×2 IMPLANT
KIT TURNOVER KIT A (KITS) ×2 IMPLANT
L-HOOK LAP DISP 36CM (ELECTROSURGICAL) ×2
LHOOK LAP DISP 36CM (ELECTROSURGICAL) ×1 IMPLANT
NDL INSUFFLATION 14GA 120MM (NEEDLE) IMPLANT
NEEDLE HYPO 22GX1.5 SAFETY (NEEDLE) ×2 IMPLANT
NEEDLE INSUFFLATION 14GA 120MM (NEEDLE) IMPLANT
POUCH SPECIMEN RETRIEVAL 10MM (ENDOMECHANICALS) ×2 IMPLANT
SCISSORS LAP 5X35 DISP (ENDOMECHANICALS) ×2 IMPLANT
SET CHOLANGIOGRAPH MIX (MISCELLANEOUS) IMPLANT
SET IRRIG TUBING LAPAROSCOPIC (IRRIGATION / IRRIGATOR) ×2 IMPLANT
SET TUBE SMOKE EVAC HIGH FLOW (TUBING) ×2 IMPLANT
SLEEVE XCEL OPT CAN 5 100 (ENDOMECHANICALS) ×4 IMPLANT
SOL ANTI FOG 6CC (MISCELLANEOUS) ×1 IMPLANT
SOLUTION ANTI FOG 6CC (MISCELLANEOUS) ×1
SPONGE DRAIN TRACH 4X4 STRL 2S (GAUZE/BANDAGES/DRESSINGS) ×2 IMPLANT
SUT MNCRL AB 4-0 PS2 18 (SUTURE) ×2 IMPLANT
TOWEL OR 17X26 10 PK STRL BLUE (TOWEL DISPOSABLE) ×2 IMPLANT
TOWEL OR NON WOVEN STRL DISP B (DISPOSABLE) IMPLANT
TRAY LAPAROSCOPIC (CUSTOM PROCEDURE TRAY) ×2 IMPLANT
TROCAR BLADELESS OPT 5 100 (ENDOMECHANICALS) ×2 IMPLANT
TROCAR XCEL 12X100 BLDLESS (ENDOMECHANICALS) IMPLANT
TROCAR XCEL BLUNT TIP 100MML (ENDOMECHANICALS) IMPLANT

## 2021-05-17 NOTE — Progress Notes (Signed)
PROGRESS NOTE    JAMAR DIERKES  [id number] DOB: 1945-11-12 DOA: 05/15/2021 PCP: Chevis Pretty, FNP    Brief Narrative:  Mr. Rohlik was admitted to the hospital with the working diagnosis of cholelithiasis and cholecystitis.    75 year old male with a past medical history for TIA, and hypertension who presented with abdominal pain, distention and vomiting.  Reported multiple episodes of vomiting for about 12 hours, associated with diffuse epigastric abdominal pain.  On his initial physical examination blood pressure 161/82, heart rate 64, respiratory rate 17, oxygen saturation 93%.  His lungs were clear to auscultation bilaterally, heart S1-S2, present, rhythmic, soft abdomen, diffuse tenderness along the epigastrium without guarding or rigidity, no lower extremity edema.   Sodium 141, potassium 4.1, chloride 105, bicarb 24, glucose 123, BUN 25, creatinine 0.8.,  White count 11.5, hemoglobin 15.7, hematocrit 46.8, platelets 242. SARS COVID-19 negative.   Urinalysis specific gravity 1.033.   CT of the abdomen with distended gallbladder but no visible stones.   Chest radiograph no infiltrates.  EKG 61 bpm, normal axis, left bundle branch block, sinus rhythm with poor R wave progression, no ST segment or T wave changes.   HIDA scan positive for cholecystitis, plan for surgical procedure today.   Assessment & Plan:   Principal Problem:   Cholelithiasis and cholecystitis without obstruction Active Problems:   Hypertension   Gastroesophageal reflux disease without esophagitis   Mixed hyperlipidemia   Cholecystitis   Acute cholecystitis.  HIDA scan with non filling of the gallbladder consistent with cystic duct obstruction. Positive signs of cholecystitis.  Abdominal pain controlled with IV analgesics. (Hydromorphone) Continue antibiotic therapy, plan for surgery today, cholecystectomy.  Continue antiacid therapy and supportive IV fluids.    2. HTN. Blood pressure  systolic [id number] mmHG, continue to hold on antihypertensive medications.    3. Dyslipidemia/ TIA.  Holding clopidogrel, continue statin therapy.    Patient continue to be at high risk for worsening cholecystitis.   Status is: Inpatient  Remains inpatient appropriate because:IV treatments appropriate due to intensity of illness or inability to take PO  Dispo: The patient is from: Home              Anticipated d/c is to: Home              Patient currently is not medically stable to d/c.   Difficult to place patient No  DVT prophylaxis: Enoxaparin   Code Status:   full  Family Communication:  I spoke with patient's wife at the bedside, we talked in detail about patient's condition, plan of care and prognosis and all questions were addressed.     Consultants:  Surgery    Antimicrobials:  Ceftriaxone     Subjective: Patient continue to have right upper quadrant abdominal pain, controlled with analgesics IV, no nausea or vomiting, he has been NPO.   Objective: Vitals:   05/16/21 0522 05/16/21 2224 05/17/21 0500 05/17/21 0522  BP: 127/65 127/70  129/64  Pulse: 80 70  70  Resp: '16 18  16  '$ Temp: 98.5 F (36.9 C) 98.6 F (37 C)  98.1 F (36.7 C)  TempSrc: Oral Oral  Oral  SpO2: (!) 89% 96%  92%  Weight:   79.2 kg   Height:        Intake/Output Summary (Last 24 hours) at 05/17/2021 0949 Last data filed at 05/17/2021 0739 Gross per 24 hour  Intake 912.5 ml  Output --  Net 912.5 ml   [employer]  05/15/21 2121 05/17/21 0500  Weight: 74.8 kg 79.2 kg    Examination:   General: Not in pain or dyspnea,. Deconditioned  Neurology: Awake and alert, non focal  E ENT: no pallor, no icterus, oral mucosa moist Cardiovascular: No JVD. S1-S2 present, rhythmic, no gallops, rubs, or murmurs. No lower extremity edema. Pulmonary: vesicular breath sounds bilaterally, adequate air movement, no wheezing, rhonchi or rales. Gastrointestinal. Abdomen positive ventral hernia, positive  pain to superficial palpation at the right upper quadrant.  Skin. No rashes Musculoskeletal: no joint deformities     Data Reviewed: I have personally reviewed following labs and imaging studies  CBC: Recent Labs  Lab 05/15/21 1150 05/16/21 0337 05/17/21 0335  WBC 11.5* 16.6* 13.7*  NEUTROABS 9.9*  --   --   HGB 15.7 15.0 14.4  HCT 46.8 44.5 43.1  MCV 93.8 94.5 96.6  PLT 242 213 [id number]   Basic Metabolic Panel: Recent Labs  Lab 05/15/21 1150 05/16/21 0337 05/17/21 0335  NA 141 136 134*  K 4.1 4.2 4.1  CL 105 104 103  CO2 '24 23 26  '$ GLUCOSE 123* 120* 131*  BUN 25* 20 17  CREATININE 0.80 0.78 0.71  CALCIUM 10.3 9.6 8.9   GFR: Estimated Creatinine Clearance: 74.8 mL/min (by C-G formula based on SCr of 0.71 mg/dL). Liver Function Tests: Recent Labs  Lab 05/15/21 1150 05/16/21 0337  AST 20 17  ALT 21 18  ALKPHOS 67 60  BILITOT 0.5 0.7  PROT 6.9 6.0*  ALBUMIN 4.3 3.5   Recent Labs  Lab 05/15/21 1150  LIPASE 32   No results for input(s): AMMONIA in the last 168 hours. Coagulation Profile: Recent Labs  Lab 05/15/21 1150  INR 1.0   Cardiac Enzymes: No results for input(s): CKTOTAL, CKMB, CKMBINDEX, TROPONINI in the last 168 hours. BNP (last 3 results) No results for input(s): PROBNP in the last 8760 hours. HbA1C: No results for input(s): HGBA1C in the last 72 hours. CBG: No results for input(s): GLUCAP in the last 168 hours. Lipid Profile: No results for input(s): CHOL, HDL, LDLCALC, TRIG, CHOLHDL, LDLDIRECT in the last 72 hours. Thyroid Function Tests: No results for input(s): TSH, T4TOTAL, FREET4, T3FREE, THYROIDAB in the last 72 hours. Anemia Panel: No results for input(s): VITAMINB12, FOLATE, FERRITIN, TIBC, IRON, RETICCTPCT in the last 72 hours.    Radiology Studies: I have reviewed all of the imaging during this hospital visit personally     Scheduled Meds: Continuous Infusions:  cefTRIAXone (ROCEPHIN)  IV 2 g (05/16/21 1821)   dextrose  5% lactated ringers 75 mL/hr at 05/17/21 0758   famotidine (PEPCID) IV 20 mg (05/16/21 1722)     LOS: 1 day        Anniemae Haberkorn Gerome Apley, MD

## 2021-05-17 NOTE — Progress Notes (Signed)
Subjective: HIDA yesterday confirms acute cholecystitis. Patient has been afebrile, WBC downtrending. LFTs normal. Still in a significant amount of pain this morning.  Objective: Vital signs in last 24 hours: Temp:  [98.1 F (36.7 C)-98.6 F (37 C)] 98.1 F (36.7 C) (07/30 0522) Pulse Rate:  [70] 70 (07/30 0522) Resp:  [16-18] 16 (07/30 0522) BP: (127-129)/(64-70) 129/64 (07/30 0522) SpO2:  [92 %-96 %] 92 % (07/30 0522) Weight:  [79.2 kg] 79.2 kg (07/30 0500) Last BM Date: 05/15/21  Intake/Output from previous day: 07/29 0701 - 07/30 0700 In: 912.5 [I.V.:912.5] Out: -  Intake/Output this shift: No intake/output data recorded.  PE: Gen:  Alert and oriented Pulm: normal work of breathing on room air Abd: Soft, diastasis recti, small supraumbilical hernia, tender to palpation in epigastric area and RUQ Ext:  No LE edema Psych: A&Ox3  Skin: no rashes noted, warm and dry  Lab Results:  Recent Labs    05/16/21 0337 05/17/21 0335  WBC 16.6* 13.7*  HGB 15.0 14.4  HCT 44.5 43.1  PLT 213 189   BMET Recent Labs    05/16/21 0337 05/17/21 0335  NA 136 134*  K 4.2 4.1  CL 104 103  CO2 23 26  GLUCOSE 120* 131*  BUN 20 17  CREATININE 0.78 0.71  CALCIUM 9.6 8.9   PT/INR Recent Labs    05/15/21 1150  LABPROT 13.0  INR 1.0   CMP     Component Value Date/Time   NA 134 (L) 05/17/2021 0335   NA 141 12/23/2020 0900   K 4.1 05/17/2021 0335   CL 103 05/17/2021 0335   CO2 26 05/17/2021 0335   GLUCOSE 131 (H) 05/17/2021 0335   BUN 17 05/17/2021 0335   BUN 18 12/23/2020 0900   CREATININE 0.71 05/17/2021 0335   CALCIUM 8.9 05/17/2021 0335   PROT 6.0 (L) 05/16/2021 0337   PROT 6.3 12/23/2020 0900   ALBUMIN 3.5 05/16/2021 0337   ALBUMIN 4.3 12/23/2020 0900   AST 17 05/16/2021 0337   ALT 18 05/16/2021 0337   ALKPHOS 60 05/16/2021 0337   BILITOT 0.7 05/16/2021 0337   BILITOT 0.3 12/23/2020 0900   GFRNONAA >60 05/17/2021 0335   GFRAA 91 08/24/2018 1131    Lipase     Component Value Date/Time   LIPASE 32 05/15/2021 1150    Studies/Results: CT Abdomen Pelvis W Contrast  Result Date: 05/15/2021 CLINICAL DATA:  Abdominal pain and fever over the last few days. Distended abdomen. EXAM: CT ABDOMEN AND PELVIS WITH CONTRAST TECHNIQUE: Multidetector CT imaging of the abdomen and pelvis was performed using the standard protocol following bolus administration of intravenous contrast. CONTRAST:  16m OMNIPAQUE IOHEXOL 350 MG/ML SOLN COMPARISON:  CT 10/14/2016. FINDINGS: Lower chest: Chronic Bochdalek's hernia on the right containing only fat. Lung bases are clear. Coronary artery calcification and aortic atherosclerotic calcification are noted in the lower chest. Hepatobiliary: Liver parenchyma is normal. The gallbladder is somewhat distended but there are no calcified stones. No definite gallbladder inflammation. Consider ultrasound scan if there is primary concern regarding gallbladder pathology. Pancreas: Normal Spleen: Normal Adrenals/Urinary Tract: Adrenal glands are normal. Several small renal cysts on both sides. Nonobstructing 6 x 7 mm stone in the midportion the right kidney. No hydronephrosis. No stone along the course of either ureter. No stone in the bladder or prostate urethra. Stomach/Bowel: Stomach and small intestine are normal. The appendix is normal. The colon is normal. No evidence of diverticulosis or diverticulitis. No constipation.  Vascular/Lymphatic: Aortic atherosclerosis. No aneurysm. IVC is normal. No adenopathy. Reproductive: Previous prostatectomy. Other: No free fluid or air. Musculoskeletal: Ordinary mild degenerative changes affect the spine. IMPRESSION: No acute finding by CT. The gallbladder is slightly distended, but there are no visible stones and there is no apparent surrounding inflammatory change by CT. Consider ultrasound if there is concern regarding gallbladder disease. Multiple liver cysts and small nonobstructing renal  calculi, the largest on the right measuring 6 x 7 mm. Aortic Atherosclerosis (ICD10-I70.0). Apparent previous prostatectomy. No ascites.  Evidence of bowel obstruction or focal bowel pathology. Electronically Signed   By: Nelson Chimes M.D.   On: 05/15/2021 14:06   NM Hepato W/EF  Result Date: 05/16/2021 CLINICAL DATA:  Cholelithiasis, abdominal pain EXAM: NUCLEAR MEDICINE HEPATOBILIARY IMAGING TECHNIQUE: Sequential images of the abdomen were obtained out to 60 minutes following intravenous administration of radiopharmaceutical. Additional images were obtained for 60 minutes after administration of 3 mg morphine. RADIOPHARMACEUTICALS:  5 mCi Tc-74m Choletec IV 3 mg morphine IV COMPARISON:  Ultrasound and CT 05/15/2021 FINDINGS: Prompt uptake and biliary excretion of activity by the liver is seen. Biliary activity passes into small bowel, consistent with patent common bile duct. The gallbladder was not visualized after 60 minutes, therefore 3 mg of morphine was given intravenously and images obtained for another 60 minutes. There is non filling of the gallbladder before or after administration of morphine. There is intense radiotracer uptake along the rim of the gallbladder fossa. IMPRESSION: Non-filling of the gallbladder consistent with cystic duct obstruction. Intense radiotracer uptake along the rim of the gallbladder fossa in the liver consistent with adjacent hepatic inflammation/advanced cholecystitis. These results will be called to the ordering clinician or representative by the Radiologist Assistant, and communication documented in the PACS or [employer] Electronically Signed   By: JMaurine Simmering  On: 05/16/2021 14:08   DG Chest Port 1 View  Result Date: 05/15/2021 CLINICAL DATA:  Sepsis. EXAM: PORTABLE CHEST 1 VIEW COMPARISON:  05/08/2015 FINDINGS: The heart size and mediastinal contours are within normal limits. Both lungs are clear. The visualized skeletal structures are unremarkable.  IMPRESSION: No active disease. Electronically Signed   By: TKerby MoorsM.D.   On: 05/15/2021 12:42   UK[place]Abdomen Limited RUQ (LIVER/GB)  Result Date: 05/15/2021 CLINICAL DATA:  75year old male with right upper quadrant abdominal pain. EXAM: ULTRASOUND ABDOMEN LIMITED RIGHT UPPER QUADRANT COMPARISON:  CT abdomen pelvis dated 05/15/2021 and right upper quadrant ultrasound dated 10/05/2016. FINDINGS: Gallbladder: Multiple stones within gallbladder. No gallbladder wall thickening or pericholecystic fluid. Negative sonographic Murphy's sign. Common bile duct: Diameter: 6 mm Liver: No focal lesion identified. Within normal limits in parenchymal echogenicity. Portal vein is patent on color Doppler imaging with normal direction of blood flow towards the liver. Other: The right kidney demonstrates a slightly increased echogenicity. Several small cysts as well as a 2 cm stone is noted within the right kidney. These findings are better seen on the prior CT. IMPRESSION: 1. Cholelithiasis without sonographic evidence of acute cholecystitis. 2. Several small cysts as well as a stone in the right kidney. Electronically Signed   By: AAnner CreteM.D.   On: 05/15/2021 16:03    Anti-infectives: Anti-infectives (From admission, onward)    Start     Dose/Rate Route Frequency Ordered Stop   05/15/21 1800  cefTRIAXone (ROCEPHIN) 2 g in sodium chloride 0.9 % 100 mL IVPB        2 g 200 mL/hr over 30 Minutes Intravenous  Every 24 hours 05/15/21 1701          Assessment/Plan Epigastric/RUQ abdominal pain Cholelithiasis  Acute cholecystitis - HIDA shows no filling of gallbladder, consistent with acute cholecystitis - Cont to hold plavix, ok for chemical DVT prophylaxis - Continue IV antibiotics - OR today for laparoscopic cholecystectomy. Risks and benefits were discussed with the patient, informed consent obtained. - Keep NPO for OR   LOS: 1 day   Michaelle Birks, MD [facility] Surgery General,  Hepatobiliary and Pancreatic Surgery 05/17/21 8:22 AM

## 2021-05-17 NOTE — Progress Notes (Signed)
82mg Fentanyl wasted with RHassie BruceRN. Unable to waste in [place]

## 2021-05-17 NOTE — Anesthesia Preprocedure Evaluation (Signed)
Anesthesia Evaluation  Patient identified by MRN, date of birth, ID band Patient awake    Reviewed: Allergy & Precautions, H&P , NPO status , Patient's Chart, lab work & pertinent test results  Airway Mallampati: II   Neck ROM: full    Dental   Pulmonary former smoker,    breath sounds clear to auscultation       Cardiovascular hypertension,  Rhythm:regular Rate:Normal     Neuro/Psych TIA   GI/Hepatic GERD  ,cholecystitis   Endo/Other    Renal/GU stones     Musculoskeletal  (+) Arthritis ,   Abdominal   Peds  Hematology   Anesthesia Other Findings   Reproductive/Obstetrics                             Anesthesia Physical Anesthesia Plan  ASA: 3  Anesthesia Plan: General   Post-op Pain Management:    Induction: Intravenous  PONV Risk Score and Plan: 2 and Ondansetron, Dexamethasone and Treatment may vary due to age or medical condition  Airway Management Planned: Oral ETT  Additional Equipment:   Intra-op Plan:   Post-operative Plan: Extubation in OR  Informed Consent: I have reviewed the patients History and Physical, chart, labs and discussed the procedure including the risks, benefits and alternatives for the proposed anesthesia with the patient or authorized representative who has indicated his/her understanding and acceptance.     Dental advisory given  Plan Discussed with: CRNA, Anesthesiologist and Surgeon  Anesthesia Plan Comments:         Anesthesia Quick Evaluation

## 2021-05-17 NOTE — Progress Notes (Signed)
Pt stable on arrival from PACU. Pt is sleepy though responds to voice and touch. Pt denies pain. Vs and assessment performed. Pt placed on 4lnc due to low 02 sat of 87 on 3l [place]. Rn will continue to monitor.

## 2021-05-17 NOTE — Anesthesia Procedure Notes (Signed)
Procedure Name: Intubation Date/Time: 05/17/2021 12:20 PM Performed by: Talbot Grumbling, CRNA Pre-anesthesia Checklist: Patient identified, Patient being monitored, Timeout performed, Emergency Drugs available and Suction available Patient Re-evaluated:Patient Re-evaluated prior to induction Oxygen Delivery Method: Circle System Utilized Preoxygenation: Pre-oxygenation with 100% oxygen Induction Type: IV induction Laryngoscope Size: Mac and 3 Grade View: Grade II Tube type: Oral Tube size: 7.0 mm Number of attempts: 1 Airway Equipment and Method: stylet Placement Confirmation: ETT inserted through vocal cords under direct vision, positive ETCO2 and breath sounds checked- equal and bilateral Secured at: 21 cm Tube secured with: Tape Dental Injury: Teeth and Oropharynx as per pre-operative assessment  Comments: Charted for [employer]

## 2021-05-17 NOTE — Op Note (Signed)
Date: 05/17/21  Patient: Timothy Duran MRN: [id number]  Preoperative Diagnosis: Acute cholecystitis Postoperative Diagnosis: Same  Procedure: Laparoscopic cholecystectomy  Surgeon: Michaelle Birks, MD Assistant: Nadeen Landau, MD  EBL: 50 mL  Anesthesia: General endotracheal  Specimens: Gallbladder  Indications: Timothy Duran is a 75 yo male who has been having epigastric abdominal pain, nausea and vomiting for the last few days. CT scan and RUQ [place] did not definitely show cholecystitis. He underwent a HIDA yesterday that showed no filling of the gallbladder. He was brought to the OR today for cholecystectomy.  Findings: Acute purulent cholecystitis with cholelithiasis. Friable gallbladder wall and cystic duct stump. 19-Fr JP drain left in the gallbladder fossa.  Upon entering the abdomen (organ space), I encountered infection of the gallbladder .  CASE DATA:  Type of patient?: LDOW CASE (Surgical Hospitalist [facility] Inpatient)  Status of Case? URGENT Add On  Infection Present At Time Of Surgery (PATOS)?  PUS at the gallbladder*   Procedure details: Informed consent was obtained in the preoperative area prior to the procedure. The patient was brought to the operating room and placed on the table in the supine position. General anesthesia was induced and appropriate lines and drains were placed for intraoperative monitoring. Perioperative antibiotics were administered per SCIP guidelines. The abdomen was prepped and draped in the usual sterile fashion. A pre-procedure timeout was taken verifying patient identity, surgical site and procedure to be performed.  A small supraumbilical skin incision was made over the patient's known hernia, and the subcutaneous tissue was divided to expose the hernia sac. The sac was opened and contained omental fat. The fat was excised and a 21m  Hassan trocar was inserted through the hernia defect. The abdomen was inspected with no evidence of visceral or  vascular injury. Three 561mports were placed in the right subcostal margin, all under direct visualization. Omental adhesions around the supraumbilical port were taken down carefully with cautery. Omentum was adherent to the gallbladder and was taken down using blunt dissection. The gallbladder was distended, firm and erythematous, consistent with acute cholecystitis. Needle decompression was performed to aid in retraction, and purulent fluid was suctioned from the gallbladder. The fundus of the gallbladder was grasped and retracted cephalad. The infundibulum was retracted laterally. The gallbladder wall was very friable and tore, causing spillage of stones and pus.  The cystic triangle was dissected out using cautery and blunt dissection, and the critical view of safety was obtained. The cystic duct and cystic artery were clipped and ligated. The gallbladder was taken off the liver using cautery. The specimen was placed in an endocatch bag and removed. The surgical site was copiously irrigated with saline until the effluent was clear. Hemostasis was achieved in the gallbladder fossa using cautery, but there was still diffuse oozing. Pressure was applied with a Herchel-tec, and the gallbladder fossa was hemostatic after about 10 minutes of direct pressure. The cystic duct stump was friable and thickened and had not been completely crossed with a clip, so it was closed with a PDS endoloop. All dropped stones were removed with a stone grasper. The cystic duct and artery stumps were visually inspected and there was no evidence of bile leak or bleeding. The surgical site was again irrigated [place] and surgicel were applied to the gallbladder fossa, and a 19-Fr JP drain was placed next to the cystic duct stump and brought out through the lateral-most port. It was secured to the abdominal wall with 2-0 Nylon. The ports were removed under  direct visualization and the abdomen was desufflated. The umbilical port site fascia was  closed with a 0 vicryl suture. The skin at all port sites was closed with 4-0 monocryl subcuticular suture. Dermabond was applied.  The patient tolerated the procedure with no apparent complications. All counts were correct x2 at the end of the procedure. The patient was extubated and taken to PACU in stable condition.  Michaelle Birks, MD 05/17/21 2:41 PM

## 2021-05-17 NOTE — Plan of Care (Signed)
  Problem: Clinical Measurements: Goal: Diagnostic test results will improve Outcome: Progressing   Problem: Clinical Measurements: Goal: Respiratory complications will improve Outcome: Progressing   Problem: Clinical Measurements: Goal: Cardiovascular complication will be avoided Outcome: Progressing   Problem: Pain Managment: Goal: General experience of comfort will improve Outcome: Progressing   

## 2021-05-17 NOTE — Transfer of Care (Signed)
Immediate Anesthesia Transfer of Care Note  Patient: Timothy Duran  Procedure(s) Performed: LAPAROSCOPIC CHOLECYSTECTOMY (Abdomen)  Patient Location: PACU  Anesthesia Type:General  Level of Consciousness: sedated and patient cooperative  Airway & Oxygen Therapy: Patient Spontanous Breathing and Patient connected to face mask oxygen  Post-op Assessment: Report given to RN, Post -op Vital signs reviewed and stable and Patient moving all extremities X 4  Post vital signs: stable  Last Vitals:  Vitals Value Taken Time  BP 158/77 05/17/21 1415  Temp 37.3 C 05/17/21 1409  Pulse 87 05/17/21 1422  Resp 17 05/17/21 1422  SpO2 95 % 05/17/21 1422  Vitals shown include unvalidated device data.  Last Pain:  Vitals:   05/17/21 1409  TempSrc:   PainSc: Asleep      Patients Stated Pain Goal: 2 ([id number])  Complications: No notable events documented.

## 2021-05-17 NOTE — Progress Notes (Signed)
Pt down to pacu in stable condition. No needs at time of transfer.  °

## 2021-05-18 DIAGNOSIS — I10 Essential (primary) hypertension: Secondary | ICD-10-CM | POA: Diagnosis not present

## 2021-05-18 DIAGNOSIS — K819 Cholecystitis, unspecified: Secondary | ICD-10-CM | POA: Diagnosis not present

## 2021-05-18 DIAGNOSIS — K219 Gastro-esophageal reflux disease without esophagitis: Secondary | ICD-10-CM | POA: Diagnosis not present

## 2021-05-18 DIAGNOSIS — K8012 Calculus of gallbladder with acute and chronic cholecystitis without obstruction: Secondary | ICD-10-CM | POA: Diagnosis not present

## 2021-05-18 LAB — CBC
HCT: 41.2 % (ref 39.0–52.0)
Hemoglobin: 13.9 g/dL (ref 13.0–17.0)
MCH: 31.9 pg (ref 26.0–34.0)
MCHC: 33.7 g/dL (ref 30.0–36.0)
MCV: 94.5 fL (ref 80.0–100.0)
Platelets: 194 10*3/uL (ref 150–400)
RBC: 4.36 MIL/uL (ref 4.22–5.81)
RDW: 13.3 % (ref 11.5–15.5)
WBC: 14.2 10*3/uL — ABNORMAL HIGH (ref 4.0–10.5)
nRBC: 0 % (ref 0.0–0.2)

## 2021-05-18 LAB — BASIC METABOLIC PANEL
Anion gap: 7 (ref 5–15)
BUN: 13 mg/dL (ref 8–23)
CO2: 26 mmol/L (ref 22–32)
Calcium: 9 mg/dL (ref 8.9–10.3)
Chloride: 102 mmol/L (ref 98–111)
Creatinine, Ser: 0.74 mg/dL (ref 0.61–1.24)
GFR, Estimated: >60 mL/min (ref >60)
Glucose, Bld: 159 mg/dL — ABNORMAL HIGH (ref 70–99)
Potassium: 4 mmol/L (ref 3.5–5.1)
Sodium: 135 mmol/L (ref 135–145)

## 2021-05-18 MED ORDER — ENOXAPARIN SODIUM 40 MG/0.4ML IJ SOSY
40.0000 mg | PREFILLED_SYRINGE | INTRAMUSCULAR | Status: DC
  Administered 2021-05-18: 40 mg via SUBCUTANEOUS
  Filled 2021-05-18: qty 0.4

## 2021-05-18 MED ORDER — PANTOPRAZOLE SODIUM 40 MG PO TBEC
40.0000 mg | DELAYED_RELEASE_TABLET | Freq: Every day | ORAL | Status: DC
  Administered 2021-05-18 – 2021-05-19 (×2): 40 mg via ORAL
  Filled 2021-05-18 (×2): qty 1

## 2021-05-18 MED ORDER — TRAMADOL HCL 50 MG PO TABS
50.0000 mg | ORAL_TABLET | Freq: Four times a day (QID) | ORAL | Status: DC | PRN
  Administered 2021-05-18 (×2): 100 mg via ORAL
  Administered 2021-05-18: 50 mg via ORAL
  Filled 2021-05-18: qty 1
  Filled 2021-05-18 (×2): qty 2

## 2021-05-18 MED ORDER — HYDROMORPHONE HCL 1 MG/ML IJ SOLN
0.5000 mg | INTRAMUSCULAR | Status: DC | PRN
Start: 2021-05-18 — End: 2021-05-19

## 2021-05-18 MED ORDER — OXYCODONE HCL 5 MG PO TABS: 5.0000 mg | ORAL_TABLET | ORAL | Status: DC | PRN

## 2021-05-18 MED ORDER — SODIUM CHLORIDE 0.9 % IV SOLN
2.0000 g | Freq: Once | INTRAVENOUS | Status: AC
  Administered 2021-05-18: 2 g via INTRAVENOUS
  Filled 2021-05-18: qty 2

## 2021-05-18 NOTE — Progress Notes (Signed)
Md paged to notify of pt adverse reaction to oxycodone in the past. He reports the drug causes him confusion and behavioral issues. Rn added this to pt allergy list.

## 2021-05-18 NOTE — Plan of Care (Signed)
  Problem: Clinical Measurements: Goal: Respiratory complications will improve Outcome: Progressing   Problem: Clinical Measurements: Goal: Cardiovascular complication will be avoided Outcome: Progressing   Problem: Elimination: Goal: Will not experience complications related to bowel motility Outcome: Progressing   Problem: Skin Integrity: Goal: Risk for impaired skin integrity will decrease Outcome: Progressing   Problem: Pain Managment: Goal: General experience of comfort will improve Outcome: Progressing   

## 2021-05-18 NOTE — Plan of Care (Signed)
  Problem: Coping: Goal: Level of anxiety will decrease Outcome: Progressing   Problem: Pain Managment: Goal: General experience of comfort will improve Outcome: Progressing   

## 2021-05-18 NOTE — Progress Notes (Addendum)
PROGRESS NOTE    Timothy Duran  [id number] DOB: [birth year] DOA: 05/15/2021 PCP: Chevis Pretty, FNP    Brief Narrative:  Timothy Duran was admitted to the hospital with the working diagnosis of cholelithiasis and cholecystitis.    75 year old male with a past medical history for TIA, and hypertension who presented with abdominal pain, distention and vomiting.  Reported multiple episodes of vomiting for about 12 hours, associated with diffuse epigastric abdominal pain.  On his initial physical examination blood pressure 161/82, heart rate 64, respiratory rate 17, oxygen saturation 93%.  His lungs were clear to auscultation bilaterally, heart S1-S2, present, rhythmic, soft abdomen, diffuse tenderness along the epigastrium without guarding or rigidity, no lower extremity edema.   Sodium 141, potassium 4.1, chloride 105, bicarb 24, glucose 123, BUN 25, creatinine 0.8.,  White count 11.5, hemoglobin 15.7, hematocrit 46.8, platelets 242. SARS COVID-19 negative.   Urinalysis specific gravity 1.033.   CT of the abdomen with distended gallbladder but no visible stones.   Chest radiograph no infiltrates.  EKG 61 bpm, normal axis, left bundle branch block, sinus rhythm with poor R wave progression, no ST segment or T wave changes.   HIDA scan positive for cholecystitis.  07/30 cholecystectomy. Positive infected gallbladder, purulent cholecystitis.    Assessment & Plan:   Principal Problem:   Cholelithiasis and cholecystitis without obstruction Active Problems:   Hypertension   Gastroesophageal reflux disease without esophagitis   Mixed hyperlipidemia   Cholecystitis     Acute cholecystitis. Purulent cholecystitis.   HIDA scan with non filling of the gallbladder consistent with cystic duct obstruction. Positive signs of cholecystitis. Wbc is  14.2   Sp cholecystectomy, drain in place. Abdominal pain is well controlled, diet will be advanced. Continue to follow up cell count in  am. Antibiotic therapy with IV ceftriaxone for 24 hrs post op per surgery recommendations.  Will dc IV fluids   Encourage to ambulate in the hallway and to get out of bed to chair tid with meals.   2. HTN. Continue to hold on antihypertensive medications. Continue   blood pressure monitoring. At home patient taking benicar.    3. Dyslipidemia/ TIA.  Continue clopidogrel for now Continue with statin therapy.    Status is: Inpatient  Remains inpatient appropriate because:Inpatient level of care appropriate due to severity of illness  Dispo: The patient is from: Home              Anticipated d/c is to: Home possible dc home in am 08.01              Patient currently is not medically stable to d/c.   Difficult to place patient No   DVT prophylaxis: Enoxaparin   Code Status:   full  Family Communication:   I spoke with patient's wife and daughter at the bedside, we talked in detail about patient's condition, plan of care and prognosis and all questions were addressed.   Consultants:  Surgery   Procedures:   Cholecystectomy   Antimicrobials:   Ceftriaxone IV     Subjective:  Patient is feeling better, abdominal pain has improved, no nausea or vomiting. He has been ambulating to the restroom.   Objective: Vitals:   05/18/21 0040 05/18/21 0500 05/18/21 0546 05/18/21 1021  BP: (!) 164/78  135/84 129/68  Pulse: 75  70 67  Resp: '18  16 17  '$ Temp: 98.2 F (36.8 C)  97.7 F (36.5 C) 98.3 F (36.8 C)  TempSrc: Oral  Oral Oral  SpO2: 92%  99% 94%  Weight:  77.4 kg    Height:        Intake/Output Summary (Last 24 hours) at 05/18/2021 1306 Last data filed at 05/18/2021 [id number] Gross per 24 hour  Intake 3350.6 ml  Output 490 ml  Net 2860.6 ml   Filed Weights   05/15/21 2121 05/17/21 0500 05/18/21 0500  Weight: 74.8 kg 79.2 kg 77.4 kg    Examination:   General: Not in pain or dyspnea, deconditioned  Neurology: Awake and alert, non focal  E ENT: no pallor, no icterus,  oral mucosa moist Cardiovascular: No JVD. S1-S2 present, rhythmic, no gallops, rubs, or murmurs. No lower extremity edema. Pulmonary: positive breath sounds bilaterally, adequate air movement, no wheezing, rhonchi or rales. Gastrointestinal. Abdomen soft and non tender to superficial palpation, right upper quadrant drain in place with bloody drainage.  Skin. No rashes Musculoskeletal: no joint deformities     Data Reviewed: I have personally reviewed following labs and imaging studies  CBC: Recent Labs  Lab 05/15/21 1150 05/16/21 0337 05/17/21 0335 05/18/21 0346  WBC 11.5* 16.6* 13.7* 14.2*  NEUTROABS 9.9*  --   --   --   HGB 15.7 15.0 14.4 13.9  HCT 46.8 44.5 43.1 41.2  MCV 93.8 94.5 96.6 94.5  PLT 242 213 189 [id number]   Basic Metabolic Panel: Recent Labs  Lab 05/15/21 1150 05/16/21 0337 05/17/21 0335 05/18/21 0346  NA 141 136 134* 135  K 4.1 4.2 4.1 4.0  CL 105 104 103 102  CO2 '24 23 26 26  '$ GLUCOSE 123* 120* 131* 159*  BUN 25* '20 17 13  '$ CREATININE 0.80 0.78 0.71 0.74  CALCIUM 10.3 9.6 8.9 9.0   GFR: Estimated Creatinine Clearance: 74.8 mL/min (by C-G formula based on SCr of 0.74 mg/dL). Liver Function Tests: Recent Labs  Lab 05/15/21 1150 05/16/21 0337  AST 20 17  ALT 21 18  ALKPHOS 67 60  BILITOT 0.5 0.7  PROT 6.9 6.0*  ALBUMIN 4.3 3.5   Recent Labs  Lab 05/15/21 1150  LIPASE 32   No results for input(s): AMMONIA in the last 168 hours. Coagulation Profile: Recent Labs  Lab 05/15/21 1150  INR 1.0   Cardiac Enzymes: No results for input(s): CKTOTAL, CKMB, CKMBINDEX, TROPONINI in the last 168 hours. BNP (last 3 results) No results for input(s): PROBNP in the last 8760 hours. HbA1C: No results for input(s): HGBA1C in the last 72 hours. CBG: No results for input(s): GLUCAP in the last 168 hours. Lipid Profile: No results for input(s): CHOL, HDL, LDLCALC, TRIG, CHOLHDL, LDLDIRECT in the last 72 hours. Thyroid Function Tests: No results for  input(s): TSH, T4TOTAL, FREET4, T3FREE, THYROIDAB in the last 72 hours. Anemia Panel: No results for input(s): VITAMINB12, FOLATE, FERRITIN, TIBC, IRON, RETICCTPCT in the last 72 hours.    Radiology Studies: I have reviewed all of the imaging during this hospital visit personally     Scheduled Meds:  pantoprazole  40 mg Oral Daily   Continuous Infusions:   LOS: 2 days        Manus Weedman Gerome Apley, MD

## 2021-05-18 NOTE — Progress Notes (Signed)
Pt stable at this time. Pt tolerated soup and sandwich lunch well. Pt has also ambulated in hall without difficulty. Rn will continue to monitor.

## 2021-05-18 NOTE — Progress Notes (Signed)
1 Day Post-Op  Subjective: Afebrile, vitals stable. Patient feeling a little better today. Tolerating some clear liquids, but has minimal appetite. Hgb stable.  Objective: Vital signs in last 24 hours: Temp:  [97.7 F (36.5 C)-99.2 F (37.3 C)] 97.7 F (36.5 C) (07/31 0546) Pulse Rate:  [68-87] 70 (07/31 0546) Resp:  [13-22] 16 (07/31 0546) BP: (135-164)/(68-85) 135/84 (07/31 0546) SpO2:  [91 %-99 %] 99 % (07/31 0546) Weight:  [77.4 kg] 77.4 kg (07/31 0500) Last BM Date: 05/16/21  Intake/Output from previous day: 07/30 0701 - 07/31 0700 In: 3044.4 [P.O.:360; I.V.:2484.4; IV Piggyback:200] Out: 175 [Drains:125; Blood:50] Intake/Output this shift: Total I/O In: 356.3 [P.O.:240; I.V.:116.3] Out: 300 [Urine:300]  PE: Gen:  Alert and oriented Pulm: normal work of breathing on room air Abd: Soft, nondisteded, mildly tender. Incisions clean and dry with no erythema or induration. JP with serosanguinous drainage. Ext:  No LE edema Psych: A&Ox3  Skin: no rashes noted, warm and dry  Lab Results:  Recent Labs    05/17/21 0335 05/18/21 0346  WBC 13.7* 14.2*  HGB 14.4 13.9  HCT 43.1 41.2  PLT 189 194   BMET Recent Labs    05/17/21 0335 05/18/21 0346  NA 134* 135  K 4.1 4.0  CL 103 102  CO2 26 26  GLUCOSE 131* 159*  BUN 17 13  CREATININE 0.71 0.74  CALCIUM 8.9 9.0   PT/INR Recent Labs    05/15/21 1150  LABPROT 13.0  INR 1.0   CMP     Component Value Date/Time   NA 135 05/18/2021 0346   NA 141 12/23/2020 0900   K 4.0 05/18/2021 0346   CL 102 05/18/2021 0346   CO2 26 05/18/2021 0346   GLUCOSE 159 (H) 05/18/2021 0346   BUN 13 05/18/2021 0346   BUN 18 12/23/2020 0900   CREATININE 0.74 05/18/2021 0346   CALCIUM 9.0 05/18/2021 0346   PROT 6.0 (L) 05/16/2021 0337   PROT 6.3 12/23/2020 0900   ALBUMIN 3.5 05/16/2021 0337   ALBUMIN 4.3 12/23/2020 0900   AST 17 05/16/2021 0337   ALT 18 05/16/2021 0337   ALKPHOS 60 05/16/2021 0337   BILITOT 0.7  05/16/2021 0337   BILITOT 0.3 12/23/2020 0900   GFRNONAA >60 05/18/2021 0346   GFRAA 91 08/24/2018 1131   Lipase     Component Value Date/Time   LIPASE 32 05/15/2021 1150    Studies/Results: NM Hepato W/EF  Result Date: 05/16/2021 CLINICAL DATA:  Cholelithiasis, abdominal pain EXAM: NUCLEAR MEDICINE HEPATOBILIARY IMAGING TECHNIQUE: Sequential images of the abdomen were obtained out to 60 minutes following intravenous administration of radiopharmaceutical. Additional images were obtained for 60 minutes after administration of 3 mg morphine. RADIOPHARMACEUTICALS:  5 mCi Tc-13m Choletec IV 3 mg morphine IV COMPARISON:  Ultrasound and CT 05/15/2021 FINDINGS: Prompt uptake and biliary excretion of activity by the liver is seen. Biliary activity passes into small bowel, consistent with patent common bile duct. The gallbladder was not visualized after 60 minutes, therefore 3 mg of morphine was given intravenously and images obtained for another 60 minutes. There is non filling of the gallbladder before or after administration of morphine. There is intense radiotracer uptake along the rim of the gallbladder fossa. IMPRESSION: Non-filling of the gallbladder consistent with cystic duct obstruction. Intense radiotracer uptake along the rim of the gallbladder fossa in the liver consistent with adjacent hepatic inflammation/advanced cholecystitis. These results will be called to the ordering clinician or representative by the Radiologist Assistant, and  communication documented in the PACS or [employer]. Electronically Signed   By: Maurine Simmering   On: 05/16/2021 14:08    Anti-infectives: Anti-infectives (From admission, onward)    Start     Dose/Rate Route Frequency Ordered Stop   05/17/21 1201  sodium chloride 0.9 % with ceFAZolin (ANCEF) ADS Med       Note to Pharmacy: Marla Roe   : cabinet override      05/17/21 1201 05/18/21 0014   05/15/21 1800  cefTRIAXone (ROCEPHIN) 2 g in sodium chloride  0.9 % 100 mL IVPB        2 g 200 mL/hr over 30 Minutes Intravenous Every 24 hours 05/15/21 1701          Assessment/Plan Acute purulent cholecystitis POD1 s/p lap cholecystectomy - Advance diet - Antibiotics for 24 hours postop - Drain is to remain in place at discharge, we will arrange follow up in 1 week for drain check - Ok for chemical DVT ppx, but please hold Plavix for at least 48 hours postop. - Surgery will continue to follow. Possible discharge tomorrow if pain improved and tolerating PO.   LOS: 2 days   Michaelle Birks, MD [facility] General, Hepatobiliary and Pancreatic Surgery 05/18/21 8:11 AM

## 2021-05-19 ENCOUNTER — Encounter (HOSPITAL_COMMUNITY): Payer: Self-pay | Admitting: Surgery

## 2021-05-19 DIAGNOSIS — I10 Essential (primary) hypertension: Secondary | ICD-10-CM | POA: Diagnosis not present

## 2021-05-19 DIAGNOSIS — K819 Cholecystitis, unspecified: Secondary | ICD-10-CM | POA: Diagnosis not present

## 2021-05-19 DIAGNOSIS — K219 Gastro-esophageal reflux disease without esophagitis: Secondary | ICD-10-CM | POA: Diagnosis not present

## 2021-05-19 DIAGNOSIS — K8012 Calculus of gallbladder with acute and chronic cholecystitis without obstruction: Secondary | ICD-10-CM | POA: Diagnosis not present

## 2021-05-19 LAB — CBC
HCT: 38.4 % — ABNORMAL LOW (ref 39.0–52.0)
Hemoglobin: 12.7 g/dL — ABNORMAL LOW (ref 13.0–17.0)
MCH: 31.8 pg (ref 26.0–34.0)
MCHC: 33.1 g/dL (ref 30.0–36.0)
MCV: 96.2 fL (ref 80.0–100.0)
Platelets: 218 10*3/uL (ref 150–400)
RBC: 3.99 MIL/uL — ABNORMAL LOW (ref 4.22–5.81)
RDW: 13.3 % (ref 11.5–15.5)
WBC: 11.4 10*3/uL — ABNORMAL HIGH (ref 4.0–10.5)
nRBC: 0 % (ref 0.0–0.2)

## 2021-05-19 LAB — BASIC METABOLIC PANEL
Anion gap: 6 (ref 5–15)
BUN: 23 mg/dL (ref 8–23)
CO2: 28 mmol/L (ref 22–32)
Calcium: 9 mg/dL (ref 8.9–10.3)
Chloride: 104 mmol/L (ref 98–111)
Creatinine, Ser: 0.72 mg/dL (ref 0.61–1.24)
GFR, Estimated: >60 mL/min (ref >60)
Glucose, Bld: 119 mg/dL — ABNORMAL HIGH (ref 70–99)
Potassium: 4 mmol/L (ref 3.5–5.1)
Sodium: 138 mmol/L (ref 135–145)

## 2021-05-19 MED ORDER — TRAMADOL HCL 50 MG PO TABS: 50.0000 mg | ORAL_TABLET | Freq: Four times a day (QID) | ORAL | 0 refills | Status: AC | PRN

## 2021-05-19 MED ORDER — BISACODYL 10 MG RE SUPP
10.0000 mg | Freq: Once | RECTAL | Status: AC
  Administered 2021-05-19: 10 mg via RECTAL
  Filled 2021-05-19: qty 1

## 2021-05-19 MED ORDER — POLYETHYLENE GLYCOL 3350 17 G PO PACK: 17.0000 g | PACK | Freq: Every day | ORAL | Status: DC | PRN

## 2021-05-19 MED ORDER — OLMESARTAN MEDOXOMIL-HCTZ 20-12.5 MG PO TABS: 0.5000 | ORAL_TABLET | Freq: Every day | ORAL | Status: DC

## 2021-05-19 MED ORDER — ACETAMINOPHEN 325 MG PO TABS: 650.0000 mg | ORAL_TABLET | Freq: Four times a day (QID) | ORAL | Status: AC | PRN

## 2021-05-19 MED ORDER — POLYETHYLENE GLYCOL 3350 17 G PO PACK: 17.0000 g | PACK | Freq: Every day | ORAL | 0 refills | Status: DC | PRN

## 2021-05-19 NOTE — Plan of Care (Signed)

## 2021-05-19 NOTE — Plan of Care (Signed)
  Problem: Coping: Goal: Level of anxiety will decrease Outcome: Progressing   Problem: Pain Managment: Goal: General experience of comfort will improve Outcome: Progressing   

## 2021-05-19 NOTE — TOC Transition Note (Signed)
Transition of Care [facility]) - CM/SW Discharge Note  Patient Details  Name: Timothy Duran MRN: [id number] Date of Birth: [birth year]  [facility]) CM/SW Contact:  Sherie Don, LCSW Phone Number: 05/19/2021, 11:47 AM  Clinical Narrative: Patient to discharge home with a drain which will require [facility]. CSW made referral to [facility] with Alvis Lemmings. Referral accepted; orders are in. CSW updated patient and RN. TOC signing off.  Final next level of care: [place] Barriers to Discharge: Barriers Resolved  Patient Goals and CMS Choice Patient states their goals for this hospitalization and ongoing recovery are:: Return home CMS Medicare.gov Compare Post Acute Care list provided to:: Patient Choice offered to / list presented to : Patient  Discharge Plan and Services         DME Arranged: N/A DME Agency: NA HH Arranged: RN [place] Agency: [place] Date [facility] Agency Contacted: 05/19/21 Representative spoke with at [place]: Tommi Rumps  Readmission Risk Interventions No flowsheet data found.

## 2021-05-19 NOTE — Anesthesia Postprocedure Evaluation (Signed)
Anesthesia Post Note  Patient: Timothy Duran  Procedure(s) Performed: LAPAROSCOPIC CHOLECYSTECTOMY (Abdomen)     Patient location during evaluation: PACU Anesthesia Type: General Level of consciousness: awake and alert Pain management: pain level controlled Vital Signs Assessment: post-procedure vital signs reviewed and stable Respiratory status: spontaneous breathing, nonlabored ventilation, respiratory function stable and patient connected to nasal cannula oxygen Cardiovascular status: blood pressure returned to baseline and stable Postop Assessment: no apparent nausea or vomiting Anesthetic complications: no   No notable events documented.  Last Vitals:  Vitals:   05/18/21 2137 05/19/21 0447  BP: 134/74 139/78  Pulse: 68 72  Resp: 17 17  Temp: (!) 36.4 C 36.7 C  SpO2: 92% 93%    Last Pain:  Vitals:   05/19/21 0447  TempSrc: Oral  PainSc:                  [place]

## 2021-05-19 NOTE — Progress Notes (Signed)
Progress Note  2 Days Post-Op  Subjective: Pain controlled. Tolerating diet. ambulating Wife and grandson bedside  Objective: Vital signs in last 24 hours: Temp:  [97.5 F (36.4 C)-98.1 F (36.7 C)] 98.1 F (36.7 C) (08/01 0447) Pulse Rate:  [65-72] 72 (08/01 0447) Resp:  [17] 17 (08/01 0447) BP: (134-139)/(72-78) 139/78 (08/01 0447) SpO2:  [92 %-94 %] 93 % (08/01 0447) Weight:  [82.1 kg] 82.1 kg (08/01 0500) Last BM Date: 05/16/21  Intake/Output from previous day: 07/31 0701 - 08/01 0700 In: 1776.3 [P.O.:1560; I.V.:116.3; IV Piggyback:100.1] Out: 780 [Urine:700; Drains:80] Intake/Output this shift: Total I/O In: 240 [P.O.:240] Out: -   PE: General: pleasant, WD, male who is laying in bed in NAD HEENT: head is normocephalic, atraumatic. Mouth is pink and moist Heart: Palpable radial pulses bilaterally Lungs: Respiratory effort nonlabored on room air Abd: soft, ND, +BS, appropriate post op TTP. JP drain with serosanguinous output. Incisions with glue intact - no erythema or discharge MSK: all 4 extremities are symmetrical with no cyanosis, clubbing, or edema. Skin: warm and dry with no masses, lesions, or rashes Psych: A&Ox3 with an appropriate affect.    Lab Results:  Recent Labs    05/18/21 0346 05/19/21 0231  WBC 14.2* 11.4*  HGB 13.9 12.7*  HCT 41.2 38.4*  PLT 194 218   BMET Recent Labs    05/18/21 0346 05/19/21 0231  NA 135 138  K 4.0 4.0  CL 102 104  CO2 26 28  GLUCOSE 159* 119*  BUN 13 23  CREATININE 0.74 0.72  CALCIUM 9.0 9.0   PT/INR No results for input(s): LABPROT, INR in the last 72 hours. CMP     Component Value Date/Time   NA 138 05/19/2021 0231   NA 141 12/23/2020 0900   K 4.0 05/19/2021 0231   CL 104 05/19/2021 0231   CO2 28 05/19/2021 0231   GLUCOSE 119 (H) 05/19/2021 0231   BUN 23 05/19/2021 0231   BUN 18 12/23/2020 0900   CREATININE 0.72 05/19/2021 0231   CALCIUM 9.0 05/19/2021 0231   PROT 6.0 (L) 05/16/2021 0337    PROT 6.3 12/23/2020 0900   ALBUMIN 3.5 05/16/2021 0337   ALBUMIN 4.3 12/23/2020 0900   AST 17 05/16/2021 0337   ALT 18 05/16/2021 0337   ALKPHOS 60 05/16/2021 0337   BILITOT 0.7 05/16/2021 0337   BILITOT 0.3 12/23/2020 0900   GFRNONAA >60 05/19/2021 0231   GFRAA 91 08/24/2018 1131   Lipase     Component Value Date/Time   LIPASE 32 05/15/2021 1150       Studies/Results: No results found.  Anti-infectives: Anti-infectives (From admission, onward)    Start     Dose/Rate Route Frequency Ordered Stop   05/18/21 1800  cefTRIAXone (ROCEPHIN) 2 g in sodium chloride 0.9 % 100 mL IVPB        2 g 200 mL/hr over 30 Minutes Intravenous  Once 05/18/21 1413 05/18/21 1730   05/17/21 1201  sodium chloride 0.9 % with ceFAZolin (ANCEF) ADS Med       Note to Pharmacy: Marla Roe   : cabinet override      05/17/21 1201 05/18/21 0014   05/15/21 1800  cefTRIAXone (ROCEPHIN) 2 g in sodium chloride 0.9 % 100 mL IVPB  Status:  Discontinued        2 g 200 mL/hr over 30 Minutes Intravenous Every 24 hours 05/15/21 1701 05/18/21 0815        Assessment/Plan  Acute purulent cholecystitis POD2  s/p lap cholecystectomy - tolerating diet - Antibiotics for 24 hours postop completed - Drain is to remain in place at discharge, we will arrange follow up in 1 week for drain check - Ok for chemical DVT ppx, but please hold Plavix for at least 48 hours postop. - okay for discharge today from surgical perspective with drain in place    LOS: 3 days    Winferd Humphrey, [facility] Surgery 05/19/2021, 10:48 AM Please see Amion for pager number during day hours 7:00am-4:30pm

## 2021-05-19 NOTE — Progress Notes (Signed)
Provided discharge education/instructions, educated and demonstrated JP drain care to Pt and family, all questions and concerns addressed, Pt not in distress. Pt discharged home with belongings accompanied by wife and son.

## 2021-05-19 NOTE — Discharge Instructions (Addendum)
[facility] ______[facility] LAPAROSCOPIC SURGERY: POST OP INSTRUCTIONS Always review your discharge instruction sheet given to you by the facility where your surgery was performed. IF YOU HAVE DISABILITY OR FAMILY LEAVE FORMS, YOU MUST BRING THEM TO THE OFFICE FOR PROCESSING.   DO NOT GIVE THEM TO YOUR DOCTOR.  A prescription for pain medication may be given to you upon discharge.  Take your pain medication as prescribed, if needed.  If narcotic pain medicine is not needed, then you may take acetaminophen (Tylenol) or ibuprofen (Advil) as needed. Take your usually prescribed medications unless otherwise directed. If you need a refill on your pain medication, please contact your pharmacy.  They will contact our office to request authorization. Prescriptions will not be filled after 5pm or on week-ends. You should follow a light diet the first few days after arrival home, such as soup and crackers, etc.  Be sure to include lots of fluids daily. Most patients will experience some swelling and bruising in the area of the incisions.  Ice packs will help.  Swelling and bruising can take several days to resolve.  It is common to experience some constipation if taking pain medication after surgery.  Increasing fluid intake and taking a stool softener (such as Colace) will usually help or prevent this problem from occurring.  A mild laxative (Milk of Magnesia or Miralax) should be taken according to package instructions if there are no bowel movements after 48 hours. Unless discharge instructions indicate otherwise, you may remove your bandages 24-48 hours after surgery, and you may shower at that time.  You may have steri-strips (small skin tapes) in place directly over the incision.  These strips should be left on the skin for 7-10 days.  If your surgeon used skin glue on the incision, you may shower in 24 hours.  The glue will flake off over the next 2-3 weeks.  Any sutures or staples will be  removed at the office during your follow-up visit. ACTIVITIES:  You may resume regular (light) daily activities beginning the next day--such as daily self-care, walking, climbing stairs--gradually increasing activities as tolerated.  You may have sexual intercourse when it is comfortable.  Refrain from any heavy lifting or straining until approved by your doctor. You may drive when you are no longer taking prescription pain medication, you can comfortably wear a seatbelt, and you can safely maneuver your car and apply brakes. RETURN TO WORK:  __________________________________________________________ Dennis Bast should see your doctor in the office for a follow-up appointment approximately 2-3 weeks after your surgery.  Make sure that you call for this appointment within a day or two after you arrive home to insure a convenient appointment time. OTHER INSTRUCTIONS: monitor your drain output - keep a log of the amount and color of drainage  WHEN TO CALL YOUR DOCTOR: Fever over 101.0 Inability to urinate Continued bleeding from incision. Increased pain, redness, or drainage from the incision. Increasing abdominal pain  The clinic staff is available to answer your questions during regular business hours.  Please don't hesitate to call and ask to speak to one of the nurses for clinical concerns.  If you have a medical emergency, go to the nearest emergency room or call 911.  A surgeon from [facility] is always on call at the hospital. [address], [place], [place], [place]  [address] ? [address], [place], [place]   [address] [phone] ? [phone] ? FAX [phone] Web site: [email or web address]

## 2021-05-19 NOTE — Discharge Summary (Addendum)
Physician Discharge Summary  ORIS CUSATO [id number] DOB: [birth year] DOA: 05/15/2021  PCP: Chevis Pretty, FNP  Admit date: 05/15/2021 Discharge date: 05/19/2021  Admitted From: Home  Disposition:  Home   Recommendations for Outpatient Follow-up and new medication changes:  Follow up with Mary-Margaret Hassell Done FNP in 7 to 10 days.   Follow up with surgery in 7 days for drain check.  Drain is to remain in place at discharge.  Start clopidogrel on 05/20/21  Home Health: yes   Equipment/Devices: na    Discharge Condition: stable  CODE STATUS: full  Diet recommendation:  heart healthy   Brief/Interim Summary: Mr. Kalal was admitted to the hospital with the working diagnosis of cholelithiasis with purulent cholecystitis.    75 year old male with a past medical history for TIA, and hypertension who presented with abdominal pain, distention and vomiting.  Reported multiple episodes of vomiting for about 12 hours, associated with diffuse epigastric abdominal pain.  On his initial physical examination blood pressure 161/82, heart rate 64, respiratory rate 17, oxygen saturation 93%.  His lungs were clear to auscultation bilaterally, heart S1-S2, present, rhythmic, soft abdomen, diffuse tenderness along the epigastrium without guarding or rigidity, no lower extremity edema.   Sodium 141, potassium 4.1, chloride 105, bicarb 24, glucose 123, BUN 25, creatinine 0.8.,  White count 11.5, hemoglobin 15.7, hematocrit 46.8, platelets 242. SARS COVID-19 negative.   Urinalysis specific gravity 1.033.   CT of the abdomen with distended gallbladder but no visible stones.   Chest radiograph no infiltrates.  EKG 61 bpm, normal axis, left bundle branch block, sinus rhythm with poor R wave progression, no ST segment or T wave changes.  Patient was placed on IV fluids and IV antibiotics.    HIDA scan positive for cholecystitis.   07/30 laparoscopic cholecystectomy. Positive infected gallbladder,  purulent cholecystitis.   Patient will be discharge with drain in place, follow up with surgery as outpatient.   Acute purulent cholecystitis (no sepsis).  Patient was admitted to the medical ward, he was placed on intravenous fluids, intravenous antibiotics, as needed analgesics and antiemetics. Underwent further work-up with HIDA scan which showed nonfilling of the gallbladder consistent with cystic duct obstruction, possible signs of cholecystitis.  Patient underwent cholecystectomy findings of infected gallbladder with purulent cholecystitis. Patient continue with IV antibiotics 24 hours post surgical procedure with good toleration.  His diet was advanced with no further nausea, vomiting or abdominal pain.  His white cell discharge is 11.4 and his blood cultures remain no growth. Currently patient has a abdominal drain in place which will be followed as an outpatient by surgery. Home health services will be arranged.  2.  Hypertension.  His antihypertensive agents were held during his hospitalization. At discharge will resume low-dose losartan/hydrochlorothiazide.  3.  TIA/dyslipidemia.  Clopidogrel was held during his hospitalization, this will be resumed 05/20/2021. Patient not on statin therapy, will plan have a follow up as outpatient with his primary care provider.    Discharge Diagnoses:  Principal Problem:   Cholelithiasis and cholecystitis without obstruction Active Problems:   Hypertension   Gastroesophageal reflux disease without esophagitis   Mixed hyperlipidemia   Cholecystitis    Discharge Instructions   Allergies as of 05/19/2021       Reactions   Oxycodone Other (See Comments)   Pt gets very confused and anxious with this medication   Other Other (See Comments)   Nasal packing- headaches and facial pain   Penicillins Other (See Comments)   Blisters  Patient  states this was a childhood reaction        Medication List     STOP taking these medications     triamcinolone cream 0.1 % Commonly known as: KENALOG       TAKE these medications    acetaminophen 325 MG tablet Commonly known as: TYLENOL Take 2 tablets (650 mg total) by mouth every 6 (six) hours as needed for mild pain (or Fever >/= 101). Notes to patient: Last dose given 07/31 08:11am   clopidogrel 75 MG tablet Commonly known as: PLAVIX Take 1 tablet (75 mg total) by mouth daily.   ESTER C PO Take 1 tablet by mouth daily. Notes to patient: Resume home regimen   MAGNESIUM PO Take 1 tablet by mouth daily. Notes to patient: Resume home regimen   MECLIZINE HCL PO Take 1 tablet by mouth daily as needed (dizziness, vertigo). Notes to patient: Resume home regimen   olmesartan-hydrochlorothiazide 20-12.5 MG tablet Commonly known as: BENICAR HCT Take 0.5 tablets by mouth daily. Notes to patient: Resume home regimen   polyethylene glycol 17 g packet Commonly known as: MIRALAX / GLYCOLAX Take 17 g by mouth daily as needed for moderate constipation.   traMADol 50 MG tablet Commonly known as: Ultram Take 1 tablet (50 mg total) by mouth every 6 (six) hours as needed for up to 3 days for moderate pain. Notes to patient: Last dose given 07/31 10:59pm   VITAMIN D3 PO Take 1 tablet by mouth daily. Notes to patient: Resume home regimen   ZINC PO Take 1 tablet by mouth daily. Notes to patient: Resume home regimen        Allergies  Allergen Reactions   Oxycodone Other (See Comments)    Pt gets very confused and anxious with this medication   Other Other (See Comments)    Nasal packing- headaches and facial pain   Penicillins Other (See Comments)    Blisters  Patient states this was a childhood reaction    Consultations: General surgery    Procedures/Studies: CT Abdomen Pelvis W Contrast  Result Date: 05/15/2021 CLINICAL DATA:  Abdominal pain and fever over the last few days. Distended abdomen. EXAM: CT ABDOMEN AND PELVIS WITH CONTRAST TECHNIQUE:  Multidetector CT imaging of the abdomen and pelvis was performed using the standard protocol following bolus administration of intravenous contrast. CONTRAST:  72m OMNIPAQUE IOHEXOL 350 MG/ML SOLN COMPARISON:  CT 10/14/2016. FINDINGS: Lower chest: Chronic Bochdalek's hernia on the right containing only fat. Lung bases are clear. Coronary artery calcification and aortic atherosclerotic calcification are noted in the lower chest. Hepatobiliary: Liver parenchyma is normal. The gallbladder is somewhat distended but there are no calcified stones. No definite gallbladder inflammation. Consider ultrasound scan if there is primary concern regarding gallbladder pathology. Pancreas: Normal Spleen: Normal Adrenals/Urinary Tract: Adrenal glands are normal. Several small renal cysts on both sides. Nonobstructing 6 x 7 mm stone in the midportion the right kidney. No hydronephrosis. No stone along the course of either ureter. No stone in the bladder or prostate urethra. Stomach/Bowel: Stomach and small intestine are normal. The appendix is normal. The colon is normal. No evidence of diverticulosis or diverticulitis. No constipation. Vascular/Lymphatic: Aortic atherosclerosis. No aneurysm. IVC is normal. No adenopathy. Reproductive: Previous prostatectomy. Other: No free fluid or air. Musculoskeletal: Ordinary mild degenerative changes affect the spine. IMPRESSION: No acute finding by CT. The gallbladder is slightly distended, but there are no visible stones and there is no apparent surrounding inflammatory change by CT. Consider ultrasound if  there is concern regarding gallbladder disease. Multiple liver cysts and small nonobstructing renal calculi, the largest on the right measuring 6 x 7 mm. Aortic Atherosclerosis (ICD10-I70.0). Apparent previous prostatectomy. No ascites.  Evidence of bowel obstruction or focal bowel pathology. Electronically Signed   By: Nelson Chimes M.D.   On: 05/15/2021 14:06   NM Hepato W/EF  Result  Date: 05/16/2021 CLINICAL DATA:  Cholelithiasis, abdominal pain EXAM: NUCLEAR MEDICINE HEPATOBILIARY IMAGING TECHNIQUE: Sequential images of the abdomen were obtained out to 60 minutes following intravenous administration of radiopharmaceutical. Additional images were obtained for 60 minutes after administration of 3 mg morphine. RADIOPHARMACEUTICALS:  5 mCi Tc-75m Choletec IV 3 mg morphine IV COMPARISON:  Ultrasound and CT 05/15/2021 FINDINGS: Prompt uptake and biliary excretion of activity by the liver is seen. Biliary activity passes into small bowel, consistent with patent common bile duct. The gallbladder was not visualized after 60 minutes, therefore 3 mg of morphine was given intravenously and images obtained for another 60 minutes. There is non filling of the gallbladder before or after administration of morphine. There is intense radiotracer uptake along the rim of the gallbladder fossa. IMPRESSION: Non-filling of the gallbladder consistent with cystic duct obstruction. Intense radiotracer uptake along the rim of the gallbladder fossa in the liver consistent with adjacent hepatic inflammation/advanced cholecystitis. These results will be called to the ordering clinician or representative by the Radiologist Assistant, and communication documented in the PACS or [employer] Electronically Signed   By: JMaurine Simmering  On: 05/16/2021 14:08   DG Chest Port 1 View  Result Date: 05/15/2021 CLINICAL DATA:  Sepsis. EXAM: PORTABLE CHEST 1 VIEW COMPARISON:  05/08/2015 FINDINGS: The heart size and mediastinal contours are within normal limits. Both lungs are clear. The visualized skeletal structures are unremarkable. IMPRESSION: No active disease. Electronically Signed   By: TKerby MoorsM.D.   On: 05/15/2021 12:42   UKoreaAbdomen Limited RUQ (LIVER/GB)  Result Date: 05/15/2021 CLINICAL DATA:  75year old male with right upper quadrant abdominal pain. EXAM: ULTRASOUND ABDOMEN LIMITED RIGHT UPPER QUADRANT  COMPARISON:  CT abdomen pelvis dated 05/15/2021 and right upper quadrant ultrasound dated 10/05/2016. FINDINGS: Gallbladder: Multiple stones within gallbladder. No gallbladder wall thickening or pericholecystic fluid. Negative sonographic Murphy's sign. Common bile duct: Diameter: 6 mm Liver: No focal lesion identified. Within normal limits in parenchymal echogenicity. Portal vein is patent on color Doppler imaging with normal direction of blood flow towards the liver. Other: The right kidney demonstrates a slightly increased echogenicity. Several small cysts as well as a 2 cm stone is noted within the right kidney. These findings are better seen on the prior CT. IMPRESSION: 1. Cholelithiasis without sonographic evidence of acute cholecystitis. 2. Several small cysts as well as a stone in the right kidney. Electronically Signed   By: AAnner CreteM.D.   On: 05/15/2021 16:03     Procedures: laparoscopic cholecystectomy   Subjective: Patient is feeling better, continue to improve abdominal pain, no nausea or vomiting and tolerating po well.   Discharge Exam: Vitals:   05/18/21 2137 05/19/21 0447  BP: 134/74 139/78  Pulse: 68 72  Resp: 17 17  Temp: (!) 97.5 F (36.4 C) 98.1 F (36.7 C)  SpO2: 92% 93%   Vitals:   05/18/21 1426 05/18/21 2137 05/19/21 0447 05/19/21 0500  BP: 139/72 134/74 139/78   Pulse: 65 68 72   Resp: '17 17 17   '$ Temp: 97.6 F (36.4 C) (!) 97.5 F (36.4 C) 98.1 F (36.7  C)   TempSrc: Oral Oral Oral   SpO2: 94% 92% 93%   Weight:    82.1 kg  Height:        General: Not in pain or dyspnea  Neurology: Awake and alert, non focal  E ENT: no pallor, no icterus, oral mucosa moist Cardiovascular: No JVD. S1-S2 present, rhythmic, no gallops, rubs, or murmurs. No lower extremity edema. Pulmonary: vesicular breath sounds bilaterally, adequate air movement, no wheezing, rhonchi or rales. Gastrointestinal. Abdomen soft and non tender to superficial palpation, positive  ventral hernia Skin. No rashes Musculoskeletal: no joint deformities   The results of significant diagnostics from this hospitalization (including imaging, microbiology, ancillary and laboratory) are listed below for reference.     Microbiology: Recent Results (from the past 240 hour(s))  Blood culture (routine single)     Status: None (Preliminary result)   Collection Time: 05/15/21 11:57 AM   Specimen: BLOOD  Result Value Ref Range Status   Specimen Description   Final    BLOOD RIGHT ANTECUBITAL Performed at [facility] [address], [place], [place] [address]    Special Requests   Final    BOTTLES DRAWN AEROBIC AND ANAEROBIC Blood Culture adequate volume Performed at [facility] [address], [place], [place] [address]    Culture   Final    NO GROWTH 4 DAYS Performed at [facility], [place] [address], [place], [place] [address]    Report Status PENDING  Incomplete  Blood Culture (routine x 2)     Status: None (Preliminary result)   Collection Time: 05/15/21 12:09 PM   Specimen: BLOOD  Result Value Ref Range Status   Specimen Description   Final    BLOOD LEFT ANTECUBITAL Performed at [facility] [address], [place], [place] [address]    Special Requests   Final    BOTTLES DRAWN AEROBIC AND ANAEROBIC Blood Culture adequate volume Performed at [facility] [address] [place], [place] [address]    Culture   Final    NO GROWTH 4 DAYS Performed at [facility], [place] [address], [place], [place] [address]    Report Status PENDING  Incomplete  Urine Culture     Status: Abnormal   Collection Time: 05/15/21  2:56 PM   Specimen: In/Out Cath Urine  Result Value Ref Range Status   Specimen Description   Final    IN/OUT CATH URINE Performed at [facility] [address] [place], [place] [address]    Special Requests   Final    NONE Performed at  [facility], [place] [address], [place], [place] [address]    Culture (A)  Final    <10,000 COLONIES/mL INSIGNIFICANT GROWTH Performed at [facility] [address], [place], [place] [address]    Report Status 05/16/2021 FINAL  Final  Resp Panel by RT-PCR (Flu A&B, Covid) Nasopharyngeal Swab     Status: None   Collection Time: 05/15/21  4:44 PM   Specimen: Nasopharyngeal Swab; Nasopharyngeal(NP) swabs in vial transport medium  Result Value Ref Range Status   SARS Coronavirus 2 by RT PCR NEGATIVE NEGATIVE Final    Comment: (NOTE) SARS-CoV-2 target nucleic acids are NOT DETECTED.  The SARS-CoV-2 RNA is generally detectable in upper respiratory specimens during the acute phase of infection. The lowest concentration of SARS-CoV-2 viral copies this assay can detect is 138 copies/mL. A negative result does not  preclude SARS-Cov-2 infection and should not be used as the sole basis for treatment or other patient management decisions. A negative result may occur with  improper specimen collection/handling, submission of specimen other than nasopharyngeal swab, presence of viral mutation(s) within the areas targeted by this assay, and inadequate number of viral copies(<138 copies/mL). A negative result must be combined with clinical observations, patient history, and epidemiological information. The expected result is Negative.  Fact Sheet for Patients:  EntrepreneurPulse.com.au  Fact Sheet for Healthcare Providers:  IncredibleEmployment.be  This test is no t yet approved or cleared by the [place] FDA and  has been authorized for detection and/or diagnosis of SARS-CoV-2 by FDA under an Emergency Use Authorization (EUA). This EUA will remain  in effect (meaning this test can be used) for the duration of the COVID-19 declaration under Section 564(b)(1) of the Act, 21 U.S.C.section 360bbb-3(b)(1), unless the authorization is  terminated  or revoked sooner.       Influenza A by PCR NEGATIVE NEGATIVE Final   Influenza B by PCR NEGATIVE NEGATIVE Final    Comment: (NOTE) The Xpert Xpress SARS-CoV-2/FLU/RSV plus assay is intended as an aid in the diagnosis of influenza from Nasopharyngeal swab specimens and should not be used as a sole basis for treatment. Nasal washings and aspirates are unacceptable for Xpert Xpress SARS-CoV-2/FLU/RSV testing.  Fact Sheet for Patients: EntrepreneurPulse.com.au  Fact Sheet for Healthcare Providers: IncredibleEmployment.be  This test is not yet approved or cleared by the [place] FDA and has been authorized for detection and/or diagnosis of SARS-CoV-2 by FDA under an Emergency Use Authorization (EUA). This EUA will remain in effect (meaning this test can be used) for the duration of the COVID-19 declaration under Section 564(b)(1) of the Act, 21 U.S.C. section 360bbb-3(b)(1), unless the authorization is terminated or revoked.  Performed at [facility], [place] [address] [place], [place] [address]   Surgical pcr screen     Status: None   Collection Time: 05/17/21 11:19 AM   Specimen: Nasal Mucosa; Nasal Swab  Result Value Ref Range Status   MRSA, PCR NEGATIVE NEGATIVE Final   Staphylococcus aureus NEGATIVE NEGATIVE Final    Comment: (NOTE) The Xpert SA Assay (FDA approved for NASAL specimens in patients 50 years of age and older), is one component of a comprehensive surveillance program. It is not intended to diagnose infection nor to guide or monitor treatment. Performed at [facility], [place] [address] [place], [place] [address]      Labs: BNP (last 3 results) No results for input(s): BNP in the last 8760 hours. Basic Metabolic Panel: Recent Labs  Lab 05/15/21 1150 05/16/21 0337 05/17/21 0335 05/18/21 0346 05/19/21 0231  NA 141 136 134* 135 138  K 4.1 4.2 4.1 4.0 4.0  CL  105 104 103 102 104  CO2 '24 23 26 26 28  '$ GLUCOSE 123* 120* 131* 159* 119*  BUN 25* '20 17 13 23  '$ CREATININE 0.80 0.78 0.71 0.74 0.72  CALCIUM 10.3 9.6 8.9 9.0 9.0   Liver Function Tests: Recent Labs  Lab 05/15/21 1150 05/16/21 0337  AST 20 17  ALT 21 18  ALKPHOS 67 60  BILITOT 0.5 0.7  PROT 6.9 6.0*  ALBUMIN 4.3 3.5   Recent Labs  Lab 05/15/21 1150  LIPASE 32   No results for input(s): AMMONIA in the last 168 hours. CBC: Recent Labs  Lab 05/15/21 1150 05/16/21 0337 05/17/21 0335 05/18/21 0346 05/19/21 0231  WBC 11.5* 16.6* 13.7*  14.2* 11.4*  NEUTROABS 9.9*  --   --   --   --   HGB 15.7 15.0 14.4 13.9 12.7*  HCT 46.8 44.5 43.1 41.2 38.4*  MCV 93.8 94.5 96.6 94.5 96.2  PLT 242 213 189 194 218   Cardiac Enzymes: No results for input(s): CKTOTAL, CKMB, CKMBINDEX, TROPONINI in the last 168 hours. BNP: Invalid input(s): POCBNP CBG: No results for input(s): GLUCAP in the last 168 hours. D-Dimer No results for input(s): DDIMER in the last 72 hours. Hgb A1c No results for input(s): HGBA1C in the last 72 hours. Lipid Profile No results for input(s): CHOL, HDL, LDLCALC, TRIG, CHOLHDL, LDLDIRECT in the last 72 hours. Thyroid function studies No results for input(s): TSH, T4TOTAL, T3FREE, THYROIDAB in the last 72 hours.  Invalid input(s): FREET3 Anemia work up No results for input(s): VITAMINB12, FOLATE, FERRITIN, TIBC, IRON, RETICCTPCT in the last 72 hours. Urinalysis    Component Value Date/Time   COLORURINE YELLOW 05/15/2021 1456   APPEARANCEUR CLOUDY (A) 05/15/2021 1456   LABSPEC 1.033 (H) 05/15/2021 1456   PHURINE 8.0 05/15/2021 1456   GLUCOSEU NEGATIVE 05/15/2021 1456   HGBUR NEGATIVE 05/15/2021 1456   BILIRUBINUR NEGATIVE 05/15/2021 1456   KETONESUR NEGATIVE 05/15/2021 1456   PROTEINUR NEGATIVE 05/15/2021 1456   UROBILINOGEN 0.2 10/30/2009 1055   NITRITE NEGATIVE 05/15/2021 1456   LEUKOCYTESUR NEGATIVE 05/15/2021 1456   Sepsis Labs Invalid input(s):  PROCALCITONIN,  WBC,  LACTICIDVEN Microbiology Recent Results (from the past 240 hour(s))  Blood culture (routine single)     Status: None (Preliminary result)   Collection Time: 05/15/21 11:57 AM   Specimen: BLOOD  Result Value Ref Range Status   Specimen Description   Final    BLOOD RIGHT ANTECUBITAL Performed at [facility], [place] [address], [place], [place] [address]    Special Requests   Final    BOTTLES DRAWN AEROBIC AND ANAEROBIC Blood Culture adequate volume Performed at [facility] [address], [place], [place] [address]    Culture   Final    NO GROWTH 4 DAYS Performed at [facility], [place] [address] [place], [place] [address]    Report Status PENDING  Incomplete  Blood Culture (routine x 2)     Status: None (Preliminary result)   Collection Time: 05/15/21 12:09 PM   Specimen: BLOOD  Result Value Ref Range Status   Specimen Description   Final    BLOOD LEFT ANTECUBITAL Performed at [facility] [address] [place], [place] [address]    Special Requests   Final    BOTTLES DRAWN AEROBIC AND ANAEROBIC Blood Culture adequate volume Performed at [facility] [address], [place], [place] [address]    Culture   Final    NO GROWTH 4 DAYS Performed at [facility], [place] [address] [place], [place] [address]    Report Status PENDING  Incomplete  Urine Culture     Status: Abnormal   Collection Time: 05/15/21  2:56 PM   Specimen: In/Out Cath Urine  Result Value Ref Range Status   Specimen Description   Final    IN/OUT CATH URINE Performed at [facility] [address] [place] [address] [place], [place] [address]    Special Requests   Final    NONE Performed at [facility], [place] [address] [place], [place] [address]    Culture (A)  Final    <10,000 COLONIES/mL INSIGNIFICANT GROWTH Performed  at [facility], [place] [address]  [place], [place] [address]    Report Status 05/16/2021 FINAL  Final  Resp Panel by RT-PCR (Flu A&B, Covid) Nasopharyngeal Swab     Status: None   Collection Time: 05/15/21  4:44 PM   Specimen: Nasopharyngeal Swab; Nasopharyngeal(NP) swabs in vial transport medium  Result Value Ref Range Status   SARS Coronavirus 2 by RT PCR NEGATIVE NEGATIVE Final    Comment: (NOTE) SARS-CoV-2 target nucleic acids are NOT DETECTED.  The SARS-CoV-2 RNA is generally detectable in upper respiratory specimens during the acute phase of infection. The lowest concentration of SARS-CoV-2 viral copies this assay can detect is 138 copies/mL. A negative result does not preclude SARS-Cov-2 infection and should not be used as the sole basis for treatment or other patient management decisions. A negative result may occur with  improper specimen collection/handling, submission of specimen other than nasopharyngeal swab, presence of viral mutation(s) within the areas targeted by this assay, and inadequate number of viral copies(<138 copies/mL). A negative result must be combined with clinical observations, patient history, and epidemiological information. The expected result is Negative.  Fact Sheet for Patients:  EntrepreneurPulse.com.au  Fact Sheet for Healthcare Providers:  IncredibleEmployment.be  This test is no t yet approved or cleared by the [place] FDA and  has been authorized for detection and/or diagnosis of SARS-CoV-2 by FDA under an Emergency Use Authorization (EUA). This EUA will remain  in effect (meaning this test can be used) for the duration of the COVID-19 declaration under Section 564(b)(1) of the Act, 21 U.S.C.section 360bbb-3(b)(1), unless the authorization is terminated  or revoked sooner.       Influenza A by PCR NEGATIVE NEGATIVE Final   Influenza B by PCR NEGATIVE NEGATIVE Final    Comment: (NOTE) The Xpert Xpress SARS-CoV-2/FLU/RSV plus assay is  intended as an aid in the diagnosis of influenza from Nasopharyngeal swab specimens and should not be used as a sole basis for treatment. Nasal washings and aspirates are unacceptable for Xpert Xpress SARS-CoV-2/FLU/RSV testing.  Fact Sheet for Patients: EntrepreneurPulse.com.au  Fact Sheet for Healthcare Providers: IncredibleEmployment.be  This test is not yet approved or cleared by the [place] FDA and has been authorized for detection and/or diagnosis of SARS-CoV-2 by FDA under an Emergency Use Authorization (EUA). This EUA will remain in effect (meaning this test can be used) for the duration of the COVID-19 declaration under Section 564(b)(1) of the Act, 21 U.S.C. section 360bbb-3(b)(1), unless the authorization is terminated or revoked.  Performed at [facility], [place] [address] [place], [place] [address]   Surgical pcr screen     Status: None   Collection Time: 05/17/21 11:19 AM   Specimen: Nasal Mucosa; Nasal Swab  Result Value Ref Range Status   MRSA, PCR NEGATIVE NEGATIVE Final   Staphylococcus aureus NEGATIVE NEGATIVE Final    Comment: (NOTE) The Xpert SA Assay (FDA approved for NASAL specimens in patients 48 years of age and older), is one component of a comprehensive surveillance program. It is not intended to diagnose infection nor to guide or monitor treatment. Performed at [facility], [place] [address], [place], [place] [address]      Time coordinating discharge: 45 minutes  SIGNED:   Tawni Millers, MD  [facility] 05/19/2021, 10:00 AM

## 2021-05-20 ENCOUNTER — Telehealth: Payer: Self-pay

## 2021-05-20 LAB — CULTURE, BLOOD (ROUTINE X 2)
Culture: NO GROWTH
Special Requests: ADEQUATE

## 2021-05-20 LAB — SURGICAL PATHOLOGY

## 2021-05-20 LAB — CULTURE, BLOOD (SINGLE)
Culture: NO GROWTH
Special Requests: ADEQUATE

## 2021-05-20 NOTE — Telephone Encounter (Signed)
Transition Care Management Follow-up Telephone Call Date of discharge and from where: [facility] 05/19/21 Diagnosis: cholecystitis and cholelithiasis - cholecystectomy  How have you been since you were released from the hospital? Much better Any questions or concerns? No  Items Reviewed: Did the pt receive and understand the discharge instructions provided? Yes  Medications obtained and verified? Yes  Other? No  Any new allergies since your discharge? No  Dietary orders reviewed? Yes Do you have support at home? Yes   Home Care and Equipment/Supplies: Were home health services ordered? yes If so, what is the name of the agency? unknown  Has the agency set up a time to come to the patient's home? no Were any new equipment or medical supplies ordered?  No What is the name of the medical supply agency? N/a Were you able to get the supplies/equipment? not applicable Do you have any questions related to the use of the equipment or supplies? No  Functional Questionnaire: (I = Independent and D = Dependent) ADLs: I  Bathing/Dressing- I  Meal Prep- I  Eating- I  Maintaining continence- I  Transferring/Ambulation- I  Managing Meds- I  Follow up appointments reviewed:  PCP Hospital f/u appt confirmed? Yes  Scheduled to see Chevis Pretty on 06/02/21 @ 11 ([place] the [place] [facility] f/u appt confirmed? Yes  Scheduled to see surgeon on 05/29/21 @ 10. Are transportation arrangements needed? No  If their condition worsens, is the pt aware to call PCP or go to the Emergency Dept.? Yes Was the patient provided with contact information for the PCP's office or ED? Yes Was to pt encouraged to call back with questions or concerns? Yes

## 2021-06-02 ENCOUNTER — Inpatient Hospital Stay: Payer: Medicare PPO | Admitting: Nurse Practitioner

## 2021-06-04 ENCOUNTER — Encounter: Payer: Self-pay | Admitting: Nurse Practitioner

## 2021-06-09 ENCOUNTER — Ambulatory Visit (INDEPENDENT_AMBULATORY_CARE_PROVIDER_SITE_OTHER): Payer: Medicare PPO | Admitting: Nurse Practitioner

## 2021-06-09 ENCOUNTER — Other Ambulatory Visit: Payer: Self-pay

## 2021-06-09 ENCOUNTER — Encounter: Payer: Self-pay | Admitting: Nurse Practitioner

## 2021-06-09 VITALS — BP 114/69 | HR 81 | Temp 98.2°F | Resp 20 | Ht 68.0 in | Wt 176.0 lb

## 2021-06-09 DIAGNOSIS — Z9049 Acquired absence of other specified parts of digestive tract: Secondary | ICD-10-CM

## 2021-06-09 DIAGNOSIS — Z9889 Other specified postprocedural states: Secondary | ICD-10-CM | POA: Diagnosis not present

## 2021-06-09 DIAGNOSIS — Z8719 Personal history of other diseases of the digestive system: Secondary | ICD-10-CM

## 2021-06-09 DIAGNOSIS — Z09 Encounter for follow-up examination after completed treatment for conditions other than malignant neoplasm: Secondary | ICD-10-CM | POA: Diagnosis not present

## 2021-06-09 DIAGNOSIS — G459 Transient cerebral ischemic attack, unspecified: Secondary | ICD-10-CM

## 2021-06-09 DIAGNOSIS — I10 Essential (primary) hypertension: Secondary | ICD-10-CM

## 2021-06-09 MED ORDER — OLMESARTAN MEDOXOMIL-HCTZ 20-12.5 MG PO TABS
0.5000 | ORAL_TABLET | Freq: Every day | ORAL | 1 refills | Status: DC
Start: 2021-06-09 — End: 2021-12-23

## 2021-06-09 MED ORDER — CLOPIDOGREL BISULFATE 75 MG PO TABS: 75.0000 mg | ORAL_TABLET | Freq: Every day | ORAL | 1 refills | Status: DC

## 2021-06-09 NOTE — Progress Notes (Signed)
Subjective:    Patient ID: Timothy Duran, male    DOB: 03/09/1946, 75 y.o.   MRN: [id number]   Chief Complaint: Hospitalization Follow-up   HPI Patient went to the ED on 05/15/21 with RUQ pain. He was dx with cholecystitis. Went in for surgery on 05/17/21. While having gallbladder removed they were going to do hernia repair with mesh. When they got into abdomen the gall bladder was so inflamed and infected that they were not able to use mesh to repair hernia. He was told he needs to be extra careful for the next 6 weeks. Since his surgery he has been doing well. Appetite back to normal. No constipation or diarrhea.     Review of Systems  Constitutional:  Negative for diaphoresis.  Eyes:  Negative for pain.  Respiratory:  Negative for shortness of breath.   Cardiovascular:  Negative for chest pain, palpitations and leg swelling.  Gastrointestinal:  Negative for abdominal pain.  Endocrine: Negative for polydipsia.  Skin:  Negative for rash.  Neurological:  Negative for dizziness, weakness and headaches.  Hematological:  Does not bruise/bleed easily.  All other systems reviewed and are negative.     Objective:   Physical Exam Vitals and nursing note reviewed.  Constitutional:      Appearance: Normal appearance. He is well-developed.  HENT:     Head: Normocephalic.     Nose: Nose normal.  Eyes:     Pupils: Pupils are equal, round, and reactive to light.  Neck:     Thyroid: No thyroid mass or thyromegaly.     Vascular: No carotid bruit or JVD.     Trachea: Phonation normal.  Cardiovascular:     Rate and Rhythm: Normal rate and regular rhythm.  Pulmonary:     Effort: Pulmonary effort is normal. No respiratory distress.     Breath sounds: Normal breath sounds.  Abdominal:     General: Bowel sounds are normal.     Palpations: Abdomen is soft.     Tenderness: There is no abdominal tenderness.  Musculoskeletal:        General: Normal range of motion.     Cervical back: Normal  range of motion and neck supple.  Lymphadenopathy:     Cervical: No cervical adenopathy.  Skin:    General: Skin is warm and dry.  Neurological:     Mental Status: He is alert and oriented to person, place, and time.  Psychiatric:        Behavior: Behavior normal.        Thought Content: Thought content normal.        Judgment: Judgment normal.    BP 114/69   Pulse 81   Temp 98.2 F (36.8 C) (Temporal)   Resp 20   Ht '5\' 8"'$  (1.727 m)   Wt 176 lb (79.8 kg)   SpO2 95%   BMI 26.76 kg/m        Assessment & Plan:  Timothy Duran in today with chief complaint of Hospitalization Follow-up   1. Status post cholecystectomy  2. Status post hernia repair Doing well  no heavy lifting or stopping  3. Hospital discharge follow-up Hospital records reviewed    The above assessment and management plan was discussed with the patient. The patient verbalized understanding of and has agreed to the management plan. Patient is aware to call the clinic if symptoms persist or worsen. Patient is aware when to return to the clinic for a follow-up visit. Patient educated  on when it is appropriate to go to the emergency department.   Mary-Margaret Hassell Done, FNP

## 2021-06-09 NOTE — Patient Instructions (Signed)
Open Hernia Repair, Adult, Care After What can I expect after the procedure? After the procedure, it is common to have: Mild discomfort. Slight bruising. Mild swelling. Pain in the belly (abdomen). A small amount of blood from the cut from surgery (incision). Follow these instructions at home: Your doctor may give you more specific instructions. If you have problems, callyour doctor. Medicines Take over-the-counter and prescription medicines only as told by your doctor. If told, take steps to prevent problems with pooping (constipation). You may need to: Drink enough fluid to keep your pee (urine) pale yellow. Take medicines. You will be told what medicines to take. Eat foods that are high in fiber. These include beans, whole grains, and fresh fruits and vegetables. Limit foods that are high in fat and sugar. These include fried or sweet foods. Ask your doctor if you should avoid driving or using machines while you are taking your medicine. Incision care  Follow instructions from your doctor about how to take care of your incision. Make sure you: Wash your hands with soap and water for at least 20 seconds before and after you change your bandage (dressing). If you cannot use soap and water, use hand sanitizer. Change your bandage. Leave stitches or skin glue in place for at least 2 weeks. Leave tape strips alone unless you are told to take them off. You may trim the edges of the tape strips if they curl up. Check your incision every day for signs of infection. Check for: More redness, swelling, or pain. More fluid or blood. Warmth. Pus or a bad smell. Wear loose, soft clothing while your incision heals.  Activity  Rest as told by your doctor. Do not lift anything that is heavier than 10 lb (4.5 kg), or the limit that you are told. Do not play contact sports until your doctor says that this is safe. If you were given a sedative during your procedure, do not drive or use machines  until your doctor says that it is safe. A sedative is a medicine that helps you relax. Return to your normal activities when your doctor says that it is safe.  General instructions Do not take baths, swim, or use a hot tub. Ask your doctor about taking showers or sponge baths. Hold a pillow over your belly when you cough or sneeze. This helps with pain. Do not smoke or use any products that contain nicotine or tobacco. If you need help quitting, ask your doctor. Keep all follow-up visits. Contact a doctor if: You have any of these signs of infection in or around your incision: More redness, swelling, or pain. More fluid or blood. Warmth. Pus. A bad smell. You have a fever or chills. You have blood in your poop (stool). You have not pooped (had a bowel movement) in 2-3 days. Medicine does not help your pain. Get help right away if: You have chest pain, or you are short of breath. You feel faint or light-headed. You have very bad pain. You vomit and your pain is worse. You have pain, swelling, or redness in a leg. These symptoms may be an emergency. Get help right away. Call your local emergency services (911 in the [place] Do not wait to see if the symptoms will go away. Do not drive yourself to the hospital. Summary After this procedure, it is common to have mild discomfort, slight bruising, and mild swelling. Follow instructions from your doctor about how to take care of your cut from surgery (incision). Check  every day for signs of infection. Do not lift heavy objects or play contact sports until your doctor says it is safe. Return to your normal activities as told by your doctor. This information is not intended to replace advice given to you by your health care provider. Make sure you discuss any questions you have with your healthcare provider. Document Revised: 05/20/2020 Document Reviewed: 05/20/2020 Elsevier Patient Education  2022 [employer].

## 2021-06-25 ENCOUNTER — Encounter: Payer: Self-pay | Admitting: Nurse Practitioner

## 2021-06-25 ENCOUNTER — Ambulatory Visit (INDEPENDENT_AMBULATORY_CARE_PROVIDER_SITE_OTHER): Payer: Medicare PPO | Admitting: Nurse Practitioner

## 2021-06-25 ENCOUNTER — Other Ambulatory Visit: Payer: Self-pay

## 2021-06-25 VITALS — BP 110/69 | HR 71 | Temp 98.6°F | Resp 20 | Ht 68.0 in | Wt 178.0 lb

## 2021-06-25 DIAGNOSIS — Z125 Encounter for screening for malignant neoplasm of prostate: Secondary | ICD-10-CM

## 2021-06-25 DIAGNOSIS — Z6826 Body mass index (BMI) 26.0-26.9, adult: Secondary | ICD-10-CM

## 2021-06-25 DIAGNOSIS — K219 Gastro-esophageal reflux disease without esophagitis: Secondary | ICD-10-CM | POA: Diagnosis not present

## 2021-06-25 DIAGNOSIS — I10 Essential (primary) hypertension: Secondary | ICD-10-CM

## 2021-06-25 DIAGNOSIS — G459 Transient cerebral ischemic attack, unspecified: Secondary | ICD-10-CM | POA: Diagnosis not present

## 2021-06-25 DIAGNOSIS — E782 Mixed hyperlipidemia: Secondary | ICD-10-CM | POA: Diagnosis not present

## 2021-06-25 NOTE — Patient Instructions (Signed)

## 2021-06-25 NOTE — Progress Notes (Signed)
Subjective:    Patient ID: Timothy Duran, male    DOB: 19-Dec-1945, 75 y.o.   MRN: [id number]   Chief Complaint: Medical Management of Chronic Issues    HPI:  1. Primary hypertension No c/o chest pain, sob or headache. Does not check blood pressure at home. BP Readings from Last 3 Encounters:  06/09/21 114/69  05/19/21 139/78  12/23/20 109/70     2. Mixed hyperlipidemia He does not watch his diet and does no dedicated exercise. Lab Results  Component Value Date   CHOL 184 12/23/2020   HDL 53 12/23/2020   LDLCALC 114 (H) 12/23/2020   TRIG 92 12/23/2020   CHOLHDL 3.5 12/23/2020     3. Gastroesophageal reflux disease without esophagitis Currently having no symptoms  4. TIA (transient ischemic attack) No recent episodes that he is aware of. Is on plavix with no issues.  5. BMI 26.0-26.9,adult No recent weight change Wt Readings from Last 3 Encounters:  06/25/21 178 lb (80.7 kg)  06/09/21 176 lb (79.8 kg)  05/19/21 181 lb (82.1 kg)   BMI Readings from Last 3 Encounters:  06/25/21 27.06 kg/m  06/09/21 26.76 kg/m  05/19/21 27.52 kg/m       Outpatient Encounter Medications as of 06/25/2021  Medication Sig   acetaminophen (TYLENOL) 325 MG tablet Take 2 tablets (650 mg total) by mouth every 6 (six) hours as needed for mild pain (or Fever >/= 101).   Bioflavonoid Products (ESTER C PO) Take 1 tablet by mouth daily.   Cholecalciferol (VITAMIN D3 PO) Take 1 tablet by mouth daily.   clopidogrel (PLAVIX) 75 MG tablet Take 1 tablet (75 mg total) by mouth daily.   MAGNESIUM PO Take 1 tablet by mouth daily.   MECLIZINE HCL PO Take 1 tablet by mouth daily as needed (dizziness, vertigo).   Multiple Vitamins-Minerals (ZINC PO) Take 1 tablet by mouth daily.   olmesartan-hydrochlorothiazide (BENICAR HCT) 20-12.5 MG tablet Take 0.5 tablets by mouth daily.   polyethylene glycol (MIRALAX / GLYCOLAX) 17 g packet Take 17 g by mouth daily as needed for moderate constipation.   No  facility-administered encounter medications on file as of 06/25/2021.    Past Surgical History:  Procedure Laterality Date   BASAL CELL CARCINOMA EXCISION Left 2015   "neck"   BLADDER STONE REMOVAL  09/2009   CHOLECYSTECTOMY N/A 05/17/2021   Procedure: LAPAROSCOPIC CHOLECYSTECTOMY;  Surgeon: Timothy Bolt, MD;  Location: [facility];  Service: General;  Laterality: N/A;   [place]   "got one stone; still has one lodge in there" (05/20/2015)   INCISION AND DRAINAGE ABSCESS Left 06/20/2015   Procedure: INCISION AND DRAINAGE ABSCESS;  Surgeon: Timothy Schools, MD;  Location: [facility];  Service: Orthopedics;  Laterality: Left;   KIDNEY STONE SURGERY  1995   KNEE ARTHROTOMY Left 07/20/2015   Procedure: KNEE ARTHROTOMY Incision and Drainage left knee;  Surgeon: Timothy Cancel, MD;  Location: [facility];  Service: Orthopedics;  Laterality: Left;   NASAL ENDOSCOPY WITH EPISTAXIS CONTROL Left 05/21/2015   Procedure: NASAL ENDOSCOPY WITH EPISTAXIS CONTROL;  Surgeon: Timothy Montane, MD;  Location: [facility];  Service: ENT;  Laterality: Left;   PROSTATECTOMY  01/2010   SHOULDER SURGERY  11/2015   torn ligament   VIDEO ASSISTED THORACOSCOPY (VATS)/WEDGE RESECTION Right 10/2009   "upper; benign"    Family History  Problem Relation Age of Onset   Dementia Mother    Prostate cancer Father  eye, lung, prostate   Prostate cancer Brother    Colon cancer Sister     New complaints: None today- had cholecystectomy several weeks ago and is doing well. They told him they repaired his ventral hernia but it is still very visible.  Social history: Lives with his wife. 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  Controlled substance contract: n/a     Review of Systems  Constitutional:  Negative for diaphoresis.  Eyes:  Negative for pain.  Respiratory:  Negative for shortness of breath.   Cardiovascular:  Negative for chest pain, palpitations and leg swelling.  Gastrointestinal:   Negative for abdominal pain.  Endocrine: Negative for polydipsia.  Skin:  Negative for rash.  Neurological:  Negative for dizziness, weakness and headaches.  Hematological:  Does not bruise/bleed easily.  All other systems reviewed and are negative.     Objective:   Physical Exam Vitals and nursing note reviewed.  Constitutional:      Appearance: Normal appearance. He is well-developed.  HENT:     Head: Normocephalic.     Nose: Nose normal.  Eyes:     Pupils: Pupils are equal, round, and reactive to light.  Neck:     Thyroid: No thyroid mass or thyromegaly.     Vascular: No carotid bruit or JVD.     Trachea: Phonation normal.  Cardiovascular:     Rate and Rhythm: Normal rate and regular rhythm.  Pulmonary:     Effort: Pulmonary effort is normal. No respiratory distress.     Breath sounds: Normal breath sounds.  Abdominal:     General: Bowel sounds are normal.     Palpations: Abdomen is soft.     Tenderness: There is no abdominal tenderness.     Hernia: A hernia (feels like ventral hernia) is present.  Musculoskeletal:        General: Normal range of motion.     Cervical back: Normal range of motion and neck supple.  Lymphadenopathy:     Cervical: No cervical adenopathy.  Skin:    General: Skin is warm and dry.  Neurological:     Mental Status: He is alert and oriented to person, place, and time.  Psychiatric:        Behavior: Behavior normal.        Thought Content: Thought content normal.        Judgment: Judgment normal.    BP 110/69   Pulse 71   Temp 98.6 F (37 C) (Temporal)   Resp 20   Ht '5\' 8"'  (1.727 m)   Wt 178 lb (80.7 kg)   SpO2 97%   BMI 27.06 kg/m         Assessment & Plan:  Timothy Duran comes in today with chief complaint of Medical Management of Chronic Issues   Diagnosis and orders addressed:  1. Primary hypertension Low sodium diet - CBC with Differential/Platelet - CMP14+EGFR  2. Mixed hyperlipidemia Low fat diet - Lipid  panel  3. Gastroesophageal reflux disease without esophagitis Avoid spicy foods Do not eat 2 hours prior to bedtime  4. TIA (transient ischemic attack) Report any episodes of syncope  5. BMI 26.0-26.9,adult Discussed diet and exercise for person with BMI >25 Will recheck weight in 3-6 months   6. Prostate cancer screening Labs pending - PSA, total and free   Labs pending Health Maintenance reviewed Diet and exercise encouraged  Follow up plan: 6 months   Mary-Margaret Hassell Done, FNP

## 2021-06-26 ENCOUNTER — Ambulatory Visit: Payer: Self-pay | Admitting: Nurse Practitioner

## 2021-07-01 LAB — CBC WITH DIFFERENTIAL/PLATELET
Basophils Absolute: 0.1 10*3/uL (ref 0.0–0.2)
Basos: 1 %
EOS (ABSOLUTE): 0.3 10*3/uL (ref 0.0–0.4)
Eos: 4 %
Hematocrit: 42.3 % (ref 37.5–51.0)
Hemoglobin: 14.5 g/dL (ref 13.0–17.7)
Immature Grans (Abs): 0.1 10*3/uL (ref 0.0–0.1)
Immature Granulocytes: 1 %
Lymphocytes Absolute: 2.5 10*3/uL (ref 0.7–3.1)
Lymphs: 31 %
MCH: 31.5 pg (ref 26.6–33.0)
MCHC: 34.3 g/dL (ref 31.5–35.7)
MCV: 92 fL (ref 79–97)
Monocytes Absolute: 0.7 10*3/uL (ref 0.1–0.9)
Monocytes: 9 %
Neutrophils Absolute: 4.4 10*3/uL (ref 1.4–7.0)
Neutrophils: 54 %
Platelets: 230 10*3/uL (ref 150–450)
RBC: 4.61 x10E6/uL (ref 4.14–5.80)
RDW: 13.3 % (ref 11.6–15.4)
WBC: 8 10*3/uL (ref 3.4–10.8)

## 2021-07-01 LAB — LIPID PANEL
Chol/HDL Ratio: 4.1 ratio (ref 0.0–5.0)
Cholesterol, Total: 184 mg/dL (ref 100–199)
HDL: 45 mg/dL (ref >39)
LDL Chol Calc (NIH): 107 mg/dL — ABNORMAL HIGH (ref 0–99)
Triglycerides: 183 mg/dL — ABNORMAL HIGH (ref 0–149)
VLDL Cholesterol Cal: 32 mg/dL (ref 5–40)

## 2021-07-01 LAB — CMP14+EGFR
ALT: 13 IU/L (ref 0–44)
AST: 14 IU/L (ref 0–40)
Albumin/Globulin Ratio: 1.9 (ref 1.2–2.2)
Albumin: 4 g/dL (ref 3.7–4.7)
Alkaline Phosphatase: 85 IU/L (ref 44–121)
BUN/Creatinine Ratio: 23 (ref 10–24)
BUN: 18 mg/dL (ref 8–27)
Bilirubin Total: 0.2 mg/dL (ref 0.0–1.2)
CO2: 23 mmol/L (ref 20–29)
Calcium: 10.2 mg/dL (ref 8.6–10.2)
Chloride: 104 mmol/L (ref 96–106)
Creatinine, Ser: 0.78 mg/dL (ref 0.76–1.27)
Globulin, Total: 2.1 g/dL (ref 1.5–4.5)
Glucose: 97 mg/dL (ref 65–99)
Potassium: 4 mmol/L (ref 3.5–5.2)
Sodium: 141 mmol/L (ref 134–144)
Total Protein: 6.1 g/dL (ref 6.0–8.5)
eGFR: 92 mL/min/{1.73_m2} (ref >59)

## 2021-07-01 LAB — PSA, TOTAL AND FREE
PSA, Free: <0.01 ng/mL
Prostate Specific Ag, Serum: <0.1 ng/mL (ref 0.0–4.0)

## 2021-11-21 ENCOUNTER — Other Ambulatory Visit: Payer: Self-pay | Admitting: Nurse Practitioner

## 2021-12-17 ENCOUNTER — Encounter: Payer: Self-pay | Admitting: Family Medicine

## 2021-12-17 ENCOUNTER — Ambulatory Visit (INDEPENDENT_AMBULATORY_CARE_PROVIDER_SITE_OTHER): Payer: Medicare PPO | Admitting: Family Medicine

## 2021-12-17 DIAGNOSIS — U071 COVID-19: Secondary | ICD-10-CM | POA: Diagnosis not present

## 2021-12-17 MED ORDER — MOLNUPIRAVIR EUA 200MG CAPSULE: 4.0000 | ORAL_CAPSULE | Freq: Two times a day (BID) | ORAL | 0 refills | Status: AC

## 2021-12-17 NOTE — Progress Notes (Signed)
? ?Virtual Visit via Telephone Note ? ?I connected with [name] on 12/17/21 at 1:00 PM by telephone and verified that I am speaking with the correct person using two identifiers. Keiji Julienne Kass is currently located at home and nobody is currently with him during this visit. The provider, Loman Brooklyn, FNP is located in their office at time of visit. ? ?I discussed the limitations, risks, security and privacy concerns of performing an evaluation and management service by telephone and the availability of in person appointments. I also discussed with the patient that there may be a patient responsible charge related to this service. The patient expressed understanding and agreed to proceed. ? ?Subjective: ?PCP: Chevis Pretty, FNP ? ?Chief Complaint  ?Patient presents with  ? Covid Positive  ? ?Patient complains of cough and sore throat. Onset of symptoms was 2 days ago, gradually improving since that time. He is drinking plenty of fluids. Evaluation to date: at home COVID test positive. Treatment to date:  Mucinex, Sudafed, Zinc, Vitamin D, and Vitamin C . He does not smoke.  ? ? ?ROS: Per HPI ? ?Current Outpatient Medications:  ?  acetaminophen (TYLENOL) 325 MG tablet, Take 2 tablets (650 mg total) by mouth every 6 (six) hours as needed for mild pain (or Fever >/= 101)., Disp: , Rfl:  ?  Bioflavonoid Products (ESTER C PO), Take 1 tablet by mouth daily., Disp: , Rfl:  ?  Cholecalciferol (VITAMIN D3 PO), Take 1 tablet by mouth daily., Disp: , Rfl:  ?  clopidogrel (PLAVIX) 75 MG tablet, Take 1 tablet (75 mg total) by mouth daily., Disp: 90 tablet, Rfl: 1 ?  MAGNESIUM PO, Take 1 tablet by mouth daily., Disp: , Rfl:  ?  MECLIZINE HCL PO, Take 1 tablet by mouth daily as needed (dizziness, vertigo)., Disp: , Rfl:  ?  Multiple Vitamins-Minerals (ZINC PO), Take 1 tablet by mouth daily., Disp: , Rfl:  ?  olmesartan-hydrochlorothiazide (BENICAR HCT) 20-12.5 MG tablet, Take 0.5 tablets by mouth daily., Disp: 90  tablet, Rfl: 1 ?  polyethylene glycol (MIRALAX / GLYCOLAX) 17 g packet, Take 17 g by mouth daily as needed for moderate constipation., Disp: 14 each, Rfl: 0 ? ?Allergies  ?Allergen Reactions  ? Oxycodone Other (See Comments)  ?  Pt gets very confused and anxious with this medication  ? Other Other (See Comments)  ?  Nasal packing- headaches and facial pain  ? Penicillins Other (See Comments)  ?  Blisters  ?Patient states this was a childhood reaction  ? ?Past Medical History:  ?Diagnosis Date  ? Abdominal hernia   ? "in there now" (05/20/2015)  ? Arthritis   ? "terrible; all over" (05/20/2015)  ? Basal cell carcinoma   ? "left neck"  ? Diverticulosis   ? Epistaxis 05/20/2015  ? hospitalized  ? Hypercholesterolemia   ? Hypertension   ? Kidney stone   ? Prostate cancer ([place]) 2010  ? prostatectomy  ? TIA (transient ischemic attack) 2005  ? ?  ? ? ?Observations/Objective: ?A&O  ?No respiratory distress or wheezing audible over the phone ?Mood, judgement, and thought processes all WNL ? ?Assessment and Plan: ?1. COVID-19 ?Discussed symptom management. ?- molnupiravir EUA (LAGEVRIO) 200 mg CAPS capsule; Take 4 capsules (800 mg total) by mouth 2 (two) times daily for 5 days.  Dispense: 40 capsule; Refill: 0 ? ? ?Follow Up Instructions: ? ?I discussed the assessment and treatment plan with the patient. The patient was provided an opportunity to ask  questions and all were answered. The patient agreed with the plan and demonstrated an understanding of the instructions. ?  ?The patient was advised to call back or seek an in-person evaluation if the symptoms worsen or if the condition fails to improve as anticipated. ? ?The above assessment and management plan was discussed with the patient. The patient verbalized understanding of and has agreed to the management plan. Patient is aware to call the clinic if symptoms persist or worsen. Patient is aware when to return to the clinic for a follow-up visit. Patient educated on when it is  appropriate to go to the emergency department.  ? ?Time call ended: 1:11 PM ? ?I provided 11 minutes of non-face-to-face time during this encounter. ? ?Hendricks Limes, MSN, APRN, FNP-C ?[place] ?12/17/21 ?

## 2021-12-23 ENCOUNTER — Ambulatory Visit (INDEPENDENT_AMBULATORY_CARE_PROVIDER_SITE_OTHER): Payer: Medicare PPO | Admitting: Nurse Practitioner

## 2021-12-23 ENCOUNTER — Encounter: Payer: Self-pay | Admitting: Nurse Practitioner

## 2021-12-23 VITALS — BP 106/63 | HR 71 | Temp 98.0°F | Resp 20 | Ht 68.0 in | Wt 181.0 lb

## 2021-12-23 DIAGNOSIS — K219 Gastro-esophageal reflux disease without esophagitis: Secondary | ICD-10-CM | POA: Diagnosis not present

## 2021-12-23 DIAGNOSIS — I10 Essential (primary) hypertension: Secondary | ICD-10-CM

## 2021-12-23 DIAGNOSIS — E782 Mixed hyperlipidemia: Secondary | ICD-10-CM | POA: Diagnosis not present

## 2021-12-23 DIAGNOSIS — R42 Dizziness and giddiness: Secondary | ICD-10-CM | POA: Diagnosis not present

## 2021-12-23 DIAGNOSIS — G459 Transient cerebral ischemic attack, unspecified: Secondary | ICD-10-CM | POA: Diagnosis not present

## 2021-12-23 DIAGNOSIS — Z6828 Body mass index (BMI) 28.0-28.9, adult: Secondary | ICD-10-CM

## 2021-12-23 MED ORDER — MECLIZINE HCL 25 MG PO TABS: 25.0000 mg | ORAL_TABLET | Freq: Every day | ORAL | 1 refills | Status: AC | PRN

## 2021-12-23 MED ORDER — CLOPIDOGREL BISULFATE 75 MG PO TABS: 75.0000 mg | ORAL_TABLET | Freq: Every day | ORAL | 1 refills | Status: DC

## 2021-12-23 MED ORDER — OLMESARTAN MEDOXOMIL-HCTZ 20-12.5 MG PO TABS: 0.5000 | ORAL_TABLET | Freq: Every day | ORAL | 1 refills | Status: DC

## 2021-12-23 NOTE — Progress Notes (Signed)
? ?Subjective:  ? ? Patient ID: Timothy Duran, male    DOB: [birth year], 76 y.o.   MRN: [id number] ? ? ?Chief Complaint: Medical Management of Chronic Issues ?  ? ?HPI: ? ?Timothy Duran is a 76 y.o. who identifies as a male who was assigned male at birth.  ? 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 ? ?Comes in today for follow up of the following chronic medical issues: ? ?1. Primary hypertension ?No c/o chest pain, sob or headache. Doe snot check blood pressure at home. ?BP Readings from Last 3 Encounters:  ?12/23/21 106/63  ?06/25/21 110/69  ?06/09/21 114/69  ? ? ? ?2. TIA (transient ischemic attack) ?No recent events ? ?3. Mixed hyperlipidemia ?Does try to watch diet but does very little exercise. ?Lab Results  ?Component Value Date  ? CHOL 184 06/25/2021  ? HDL 45 06/25/2021  ? LDLCALC 107 (H) 06/25/2021  ? TRIG 183 (H) 06/25/2021  ? CHOLHDL 4.1 06/25/2021  ?The 10-year ASCVD risk score (Arnett DK, et al., 2019) is: 24% ? ? ? ?4. Gastroesophageal reflux disease without esophagitis ?Does not usually have symptoms ? ?5. BMI 28.0-28.9,adult ?Wt Readings from Last 3 Encounters:  ?12/23/21 181 lb (82.1 kg)  ?06/25/21 178 lb (80.7 kg)  ?06/09/21 176 lb (79.8 kg)  ? ?BMI Readings from Last 3 Encounters:  ?12/23/21 27.52 kg/m?  ?06/25/21 27.06 kg/m?  ?06/09/21 26.76 kg/m?  ? ? ? ? ? ?New complaints: ?Still has occasional vertigo and will take meclizine as needed ? ?Allergies  ?Allergen Reactions  ? Oxycodone Other (See Comments)  ?  Pt gets very confused and anxious with this medication  ? Other Other (See Comments)  ?  Nasal packing- headaches and facial pain  ? Penicillins Other (See Comments)  ?  Blisters  ?Patient states this was a childhood reaction  ? ?Outpatient Encounter Medications as of 12/23/2021  ?Medication Sig  ? Cholecalciferol (VITAMIN D3 PO) Take 1 tablet by mouth daily.  ? clopidogrel (PLAVIX) 75 MG tablet Take 1 tablet (75 mg total) by mouth daily.  ? MAGNESIUM PO  Take 1 tablet by mouth daily.  ? MECLIZINE HCL PO Take 1 tablet by mouth daily as needed (dizziness, vertigo).  ? Multiple Vitamins-Minerals (ZINC PO) Take 1 tablet by mouth daily.  ? olmesartan-hydrochlorothiazide (BENICAR HCT) 20-12.5 MG tablet Take 0.5 tablets by mouth daily.  ? polyethylene glycol (MIRALAX / GLYCOLAX) 17 g packet Take 17 g by mouth daily as needed for moderate constipation.  ? acetaminophen (TYLENOL) 325 MG tablet Take 2 tablets (650 mg total) by mouth every 6 (six) hours as needed for mild pain (or Fever >/= 101). (Patient not taking: Reported on 12/23/2021)  ? [DISCONTINUED] Bioflavonoid Products (ESTER C PO) Take 1 tablet by mouth daily.  ? ?No facility-administered encounter medications on file as of 12/23/2021.  ? ? ?Past Surgical History:  ?Procedure Laterality Date  ? BASAL CELL CARCINOMA EXCISION Left 2015  ? "neck"  ? BLADDER STONE REMOVAL  09/2009  ? CHOLECYSTECTOMY N/A 05/17/2021  ? Procedure: LAPAROSCOPIC CHOLECYSTECTOMY;  Surgeon: Dwan Bolt, MD;  Location: [facility];  Service: General;  Laterality: N/A;  ? [place]  ? "got one stone; still has one lodge in there" (05/20/2015)  ? INCISION AND DRAINAGE ABSCESS Left 06/20/2015  ? Procedure: INCISION AND DRAINAGE ABSCESS;  Surgeon: Melina Schools, MD;  Location: [facility];  Service: Orthopedics;  Laterality: Left;  ?  [place]  ? KNEE ARTHROTOMY Left 07/20/2015  ? Procedure: KNEE ARTHROTOMY Incision and Drainage left knee;  Surgeon: Paralee Cancel, MD;  Location: [facility];  Service: Orthopedics;  Laterality: Left;  ? NASAL ENDOSCOPY WITH EPISTAXIS CONTROL Left 05/21/2015  ? Procedure: NASAL ENDOSCOPY WITH EPISTAXIS CONTROL;  Surgeon: Melissa Montane, MD;  Location: [facility];  Service: ENT;  Laterality: Left;  ? PROSTATECTOMY  01/2010  ? SHOULDER SURGERY  11/2015  ? torn ligament  ? VIDEO ASSISTED THORACOSCOPY (VATS)/WEDGE RESECTION Right 10/2009  ? "upper; benign"  ? ? ?Family History  ?Problem Relation Age of Onset   ? Dementia Mother   ? Prostate cancer Father   ?     eye, lung, prostate  ? Prostate cancer Brother   ? Colon cancer Sister   ? ? ? ? ?Controlled substance contract: n/a ? ? ? ? ?Review of Systems  ?Constitutional:  Negative for diaphoresis.  ?Eyes:  Negative for pain.  ?Respiratory:  Negative for shortness of breath.   ?Cardiovascular:  Negative for chest pain, palpitations and leg swelling.  ?Gastrointestinal:  Negative for abdominal pain.  ?Endocrine: Negative for polydipsia.  ?Skin:  Negative for rash.  ?Neurological:  Negative for dizziness, weakness and headaches.  ?Hematological:  Does not bruise/bleed easily.  ?All other systems reviewed and are negative. ? ?   ?Objective:  ? Physical Exam ?Vitals and nursing note reviewed.  ?Constitutional:   ?   Appearance: Normal appearance. He is well-developed.  ?HENT:  ?   Head: Normocephalic.  ?   Nose: Nose normal.  ?   Mouth/Throat:  ?   Mouth: Mucous membranes are moist.  ?   Pharynx: Oropharynx is clear.  ?Eyes:  ?   Pupils: Pupils are equal, round, and reactive to light.  ?Neck:  ?   Thyroid: No thyroid mass or thyromegaly.  ?   Vascular: No carotid bruit or JVD.  ?   Trachea: Phonation normal.  ?Cardiovascular:  ?   Rate and Rhythm: Normal rate and regular rhythm.  ?Pulmonary:  ?   Effort: Pulmonary effort is normal. No respiratory distress.  ?   Breath sounds: Normal breath sounds.  ?Abdominal:  ?   General: Bowel sounds are normal.  ?   Palpations: Abdomen is soft.  ?   Tenderness: There is no abdominal tenderness.  ?   Hernia: A hernia (ventral hernia) is present.  ?Musculoskeletal:     ?   General: Normal range of motion.  ?   Cervical back: Normal range of motion and neck supple.  ?Lymphadenopathy:  ?   Cervical: No cervical adenopathy.  ?Skin: ?   General: Skin is warm and dry.  ?Neurological:  ?   Mental Status: He is alert and oriented to person, place, and time.  ?Psychiatric:     ?   Behavior: Behavior normal.     ?   Thought Content: Thought content  normal.     ?   Judgment: Judgment normal.  ? ? ?BP 106/63   Pulse 71   Temp 98 ?F (36.7 ?C) (Temporal)   Resp 20   Ht '5\' 8"'  (1.727 m)   Wt 181 lb (82.1 kg)   SpO2 93%   BMI 27.52 kg/m?  ? ? ? ?   ?Assessment & Plan:  ? ?Timothy Duran comes in today with chief complaint of Medical Management of Chronic Issues ? ? ?Diagnosis and orders addressed: ? ?1. Primary hypertension ?Low sodium diet ?-  olmesartan-hydrochlorothiazide (BENICAR HCT) 20-12.5 MG tablet; Take 0.5 tablets by mouth daily.  Dispense: 90 tablet; Refill: 1 ? ?2. TIA (transient ischemic attack) ?- clopidogrel (PLAVIX) 75 MG tablet; Take 1 tablet (75 mg total) by mouth daily.  Dispense: 90 tablet; Refill: 1 ? ?3. Mixed hyperlipidemia ?Low fat diet ?Labs ending ? ?4. Gastroesophageal reflux disease without esophagitis ?Avoid spicy foods ?Do not eat 2 hours prior to bedtime ? ? ?5. BMI 28.0-28.9,adult ?Discussed diet and exercise for person with BMI >25 ?Will recheck weight in 3-6 months ? ? ?6. Vertigo ?Force fluids ?- meclizine (ANTIVERT) 25 MG tablet; Take 1 tablet (25 mg total) by mouth daily as needed (dizziness, vertigo).  Dispense: 30 tablet; Refill: 1 ? ?Orders Placed This Encounter  ?Procedures  ? CBC with Differential/Platelet  ? CMP14+EGFR  ? Lipid panel  ? ? ?Labs pending ?Health Maintenance reviewed ?Diet and exercise encouraged ? ?Follow up plan: ?6 months ? ? ?Mary-Margaret Hassell Done, FNP ? ?

## 2021-12-23 NOTE — Patient Instructions (Signed)
Vertigo Vertigo is the feeling that you or the things around you are moving when they are not. This feeling can come and go at any time. Vertigo often goes away on its own. This condition can be dangerous if it happens when you are doingactivities like driving or working with machines. Your doctor will do tests to find the cause of your vertigo. These tests willalso help your doctor decide on the best treatment for you. Follow these instructions at home: Eating and drinking     Drink enough fluid to keep your pee (urine) pale yellow. Do not drink alcohol. Activity Return to your normal activities when your doctor says that it is safe. In the morning, first sit up on the side of the bed. When you feel okay, stand slowly while you hold onto something until you know that your balance is fine. Move slowly. Avoid sudden body or head movements or certain positions, as told by your doctor. Use a cane if you have trouble standing or walking. Sit down right away if you feel dizzy. Avoid doing any tasks or activities that can cause danger to you or others if you get dizzy. Avoid bending down if you feel dizzy. Place items in your home so that they are easy for you to reach without bending or leaning over. Do not drive or use machinery if you feel dizzy. General instructions Take over-the-counter and prescription medicines only as told by your doctor. Keep all follow-up visits. Contact a doctor if: Your medicine does not help your vertigo. Your problems get worse or you have new symptoms. You have a fever. You feel like you may vomit (nauseous), or this feeling gets worse. You start to vomit. Your family or friends see changes in how you act. You lose feeling (have numbness) in part of your body. You feel prickling and tingling in a part of your body. Get help right away if: You are always dizzy. You faint. You get very bad headaches. You get a stiff neck. Bright light starts to bother  you. You have trouble moving or talking. You feel weak in your hands, arms, or legs. You have changes in your hearing or in how you see (vision). These symptoms may be an emergency. Get help right away. Call your local emergency services (911 in the [place] Do not wait to see if the symptoms will go away. Do not drive yourself to the hospital. Summary Vertigo is the feeling that you or the things around you are moving when they are not. Your doctor will do tests to find the cause of your vertigo. You may be told to avoid some tasks, positions, or movements. Contact a doctor if your medicine is not helping, or if you have a fever, new symptoms, or a change in how you act. Get help right away if you get very bad headaches, or if you have changes in how you speak, hear, or see. This information is not intended to replace advice given to you by your health care provider. Make sure you discuss any questions you have with your healthcare provider. Document Revised: 09/04/2020 Document Reviewed: 09/04/2020 Elsevier Patient Education  2022 [employer]  

## 2021-12-24 LAB — CBC WITH DIFFERENTIAL/PLATELET
Basophils Absolute: 0.1 10*3/uL (ref 0.0–0.2)
Basos: 1 %
EOS (ABSOLUTE): 0.3 10*3/uL (ref 0.0–0.4)
Eos: 3 %
Hematocrit: 43.6 % (ref 37.5–51.0)
Hemoglobin: 14.9 g/dL (ref 13.0–17.7)
Immature Grans (Abs): 0.1 10*3/uL (ref 0.0–0.1)
Immature Granulocytes: 1 %
Lymphocytes Absolute: 2.5 10*3/uL (ref 0.7–3.1)
Lymphs: 30 %
MCH: 31.7 pg (ref 26.6–33.0)
MCHC: 34.2 g/dL (ref 31.5–35.7)
MCV: 93 fL (ref 79–97)
Monocytes Absolute: 0.8 10*3/uL (ref 0.1–0.9)
Monocytes: 9 %
Neutrophils Absolute: 4.7 10*3/uL (ref 1.4–7.0)
Neutrophils: 56 %
Platelets: 277 10*3/uL (ref 150–450)
RBC: 4.7 x10E6/uL (ref 4.14–5.80)
RDW: 13.1 % (ref 11.6–15.4)
WBC: 8.3 10*3/uL (ref 3.4–10.8)

## 2021-12-24 LAB — CMP14+EGFR
ALT: 20 IU/L (ref 0–44)
AST: 18 IU/L (ref 0–40)
Albumin/Globulin Ratio: 2 (ref 1.2–2.2)
Albumin: 4.3 g/dL (ref 3.7–4.7)
Alkaline Phosphatase: 88 IU/L (ref 44–121)
BUN/Creatinine Ratio: 29 — ABNORMAL HIGH (ref 10–24)
BUN: 26 mg/dL (ref 8–27)
Bilirubin Total: 0.2 mg/dL (ref 0.0–1.2)
CO2: 26 mmol/L (ref 20–29)
Calcium: 9.8 mg/dL (ref 8.6–10.2)
Chloride: 106 mmol/L (ref 96–106)
Creatinine, Ser: 0.89 mg/dL (ref 0.76–1.27)
Globulin, Total: 2.2 g/dL (ref 1.5–4.5)
Glucose: 91 mg/dL (ref 70–99)
Potassium: 4.8 mmol/L (ref 3.5–5.2)
Sodium: 143 mmol/L (ref 134–144)
Total Protein: 6.5 g/dL (ref 6.0–8.5)
eGFR: 88 mL/min/{1.73_m2} (ref >59)

## 2021-12-24 LAB — LIPID PANEL
Chol/HDL Ratio: 4.8 ratio (ref 0.0–5.0)
Cholesterol, Total: 196 mg/dL (ref 100–199)
HDL: 41 mg/dL (ref >39)
LDL Chol Calc (NIH): 119 mg/dL — ABNORMAL HIGH (ref 0–99)
Triglycerides: 203 mg/dL — ABNORMAL HIGH (ref 0–149)
VLDL Cholesterol Cal: 36 mg/dL (ref 5–40)

## 2022-02-20 ENCOUNTER — Telehealth: Payer: Self-pay | Admitting: Nurse Practitioner

## 2022-02-20 NOTE — Telephone Encounter (Signed)
3 daysprior and start back after procedure ?

## 2022-02-20 NOTE — Telephone Encounter (Signed)
Left detailed message on patients home answering machine ?

## 2022-03-03 DIAGNOSIS — C61 Malignant neoplasm of prostate: Secondary | ICD-10-CM | POA: Diagnosis not present

## 2022-03-03 DIAGNOSIS — N200 Calculus of kidney: Secondary | ICD-10-CM | POA: Diagnosis not present

## 2022-03-18 ENCOUNTER — Other Ambulatory Visit: Payer: Self-pay | Admitting: Nurse Practitioner

## 2022-04-23 ENCOUNTER — Encounter: Payer: Self-pay | Admitting: *Deleted

## 2022-05-15 ENCOUNTER — Ambulatory Visit (INDEPENDENT_AMBULATORY_CARE_PROVIDER_SITE_OTHER): Payer: Medicare PPO

## 2022-05-15 VITALS — Wt 175.0 lb

## 2022-05-15 DIAGNOSIS — Z0000 Encounter for general adult medical examination without abnormal findings: Secondary | ICD-10-CM | POA: Diagnosis not present

## 2022-05-15 NOTE — Progress Notes (Signed)
Subjective:   Timothy Duran is a 76 y.o. male who presents for Medicare Annual/Subsequent preventive examination.  Virtual Visit via Telephone Note  I connected with  Timothy Duran on 05/15/22 at  8:15 AM EDT by telephone and verified that I am speaking with the correct person using two identifiers.  Location: Patient: Home Provider: [facility] Persons participating in the virtual visit: patient/Nurse Health Advisor   I discussed the limitations, risks, security and privacy concerns of performing an evaluation and management service by telephone and the availability of in person appointments. The patient expressed understanding and agreed to proceed.  Interactive audio and video telecommunications were attempted between this nurse and patient, however failed, due to patient having technical difficulties OR patient did not have access to video capability.  We continued and completed visit with audio only.  Some vital signs may be absent or patient reported.   Timothy Tarter E Hassell Patras, LPN  Review of Systems     Cardiac Risk Factors include: advanced age (>31mn, >>14women);male gender;dyslipidemia;hypertension;Other (see comment);smoking/ tobacco exposure, Risk factor comments: hx of stroke     Objective:    Today's Vitals   05/15/22 0821  Weight: 175 lb (79.4 kg)   Body mass index is 26.61 kg/m.     05/15/2022    8:26 AM 05/15/2021    6:23 PM 05/14/2021    8:20 AM 04/06/2016    8:59 AM 12/10/2015    9:12 AM 07/20/2015    8:20 PM 07/20/2015    2:24 PM  Advanced Directives  Does Patient Have a Medical Advance Directive? No No No No No No No  Would patient like information on creating a medical advance directive? No - Patient declined No - Patient declined No - Patient declined Yes - Educational materials given No - patient declined information No - patient declined information No - patient declined information    Current Medications (verified) Outpatient Encounter Medications as of 05/15/2022   Medication Sig   acetaminophen (TYLENOL) 325 MG tablet Take 2 tablets (650 mg total) by mouth every 6 (six) hours as needed for mild pain (or Fever >/= 101).   Cholecalciferol (VITAMIN D3 PO) Take 1 tablet by mouth daily.   clopidogrel (PLAVIX) 75 MG tablet Take 1 tablet (75 mg total) by mouth daily.   MAGNESIUM PO Take 1 tablet by mouth daily.   Multiple Vitamins-Minerals (ZINC PO) Take 1 tablet by mouth daily.   olmesartan-hydrochlorothiazide (BENICAR HCT) 20-12.5 MG tablet Take 0.5 tablets by mouth daily.   polyethylene glycol (MIRALAX / GLYCOLAX) 17 g packet Take 17 g by mouth daily as needed for moderate constipation.   meclizine (ANTIVERT) 25 MG tablet Take 1 tablet (25 mg total) by mouth daily as needed (dizziness, vertigo). (Patient not taking: Reported on 05/15/2022)   No facility-administered encounter medications on file as of 05/15/2022.    Allergies (verified) Oxycodone, Other, and Penicillins   History: Past Medical History:  Diagnosis Date   Abdominal hernia    "in there now" (05/20/2015)   Arthritis    "terrible; all over" (05/20/2015)   Basal cell carcinoma    "left neck"   Diverticulosis    Epistaxis 05/20/2015   hospitalized   Hypercholesterolemia    Hypertension    Kidney stone    Prostate cancer ([facility] 2010   prostatectomy   TIA (transient ischemic attack) 2005   ?   Past Surgical History:  Procedure Laterality Date   BASAL CELL CARCINOMA EXCISION Left 2015   "  neck"   BLADDER STONE REMOVAL  09/2009   CHOLECYSTECTOMY N/A 05/17/2021   Procedure: LAPAROSCOPIC CHOLECYSTECTOMY;  Surgeon: Dwan Bolt, MD;  Location: [facility];  Service: General;  Laterality: N/A;   [place]   "got one stone; still has one lodge in there" (05/20/2015)   INCISION AND DRAINAGE ABSCESS Left 06/20/2015   Procedure: INCISION AND DRAINAGE ABSCESS;  Surgeon: Melina Schools, MD;  Location: [facility];  Service: Orthopedics;  Laterality: Left;   KIDNEY STONE SURGERY   1995   KNEE ARTHROTOMY Left 07/20/2015   Procedure: KNEE ARTHROTOMY Incision and Drainage left knee;  Surgeon: Paralee Cancel, MD;  Location: [facility];  Service: Orthopedics;  Laterality: Left;   NASAL ENDOSCOPY WITH EPISTAXIS CONTROL Left 05/21/2015   Procedure: NASAL ENDOSCOPY WITH EPISTAXIS CONTROL;  Surgeon: Melissa Montane, MD;  Location: [facility];  Service: ENT;  Laterality: Left;   PROSTATECTOMY  01/2010   SHOULDER SURGERY  11/2015   torn ligament   VIDEO ASSISTED THORACOSCOPY (VATS)/WEDGE RESECTION Right 10/2009   "upper; benign"   Family History  Problem Relation Age of Onset   Dementia Mother    Prostate cancer Father        eye, lung, prostate   Prostate cancer Brother    Colon cancer Sister    Social History   Socioeconomic History   Marital status: Married    Spouse name: Timothy Duran   Number of children: 2   Years of education: 12   Highest education level: Not on file  Occupational History   Occupation: retired    Comment: part time- Chief Financial Officer for [employer]  Tobacco Use   Smoking status: Former    Packs/day: 1.00    Years: 4.00    Total pack years: 4.00    Types: Cigarettes    Quit date: 08/29/2008    Years since quitting: 13.7   Smokeless tobacco: Never  Vaping Use   Vaping Use: Never used  Substance and Sexual Activity   Alcohol use: No    Alcohol/week: 0.0 standard drinks of alcohol   Drug use: No   Sexual activity: Not on file  Other Topics Concern   Not on file  Social History Narrative   Lives with wife - 2 daughters live nearby   'caffeine use- coffee, 2 cups a day, occas tea   Social Determinants of Health   Financial Resource Strain: Low Risk  (05/15/2022)   Overall Financial Resource Strain (CARDIA)    Difficulty of Paying Living Expenses: Not hard at all  Food Insecurity: No Food Insecurity (05/15/2022)   Hunger Vital Sign    Worried About Running Out of Food in the Last Year: Never true    [place] in the Last Year: Never true  Transportation  Needs: No Transportation Needs (05/15/2022)   PRAPARE - Hydrologist (Medical): No    Lack of Transportation (Non-Medical): No  Physical Activity: Sufficiently Active (05/15/2022)   Exercise Vital Sign    Days of Exercise per Week: 7 days    Minutes of Exercise per Session: 30 min  Stress: No Stress Concern Present (05/15/2022)   [place]    Feeling of Stress : Not at all  Social Connections: [place] (05/15/2022)   Social Connection and Isolation Panel [NHANES]    Frequency of Communication with Friends and Family: More than three times a week    Frequency  of Social Gatherings with Friends and Family: More than three times a week    Attends Religious Services: More than 4 times per year    Active Member of [employer] or Organizations: Yes    Attends Music therapist: More than 4 times per year    Marital Status: Married    Tobacco Counseling Counseling given: Not Answered   Clinical Intake:  Pre-visit preparation completed: Yes  Pain : No/denies pain     BMI - recorded: 26.61 Nutritional Status: BMI 25 -29 Overweight Nutritional Risks: None Diabetes: No  How often do you need to have someone help you when you read instructions, pamphlets, or other written materials from your doctor or pharmacy?: 1 - Never  Diabetic? no  Interpreter Needed?: No  Information entered by :: Nickoles Gregori, LPN   Activities of Daily Living    05/15/2022    8:26 AM  In your present state of health, do you have any difficulty performing the following activities:  Hearing? 0  Vision? 0  Difficulty concentrating or making decisions? 0  Walking or climbing stairs? 0  Dressing or bathing? 0  Doing errands, shopping? 0  Preparing Food and eating ? N  Using the Toilet? N  In the past six months, have you accidently leaked urine? N  Do you have problems with loss of bowel control?  N  Managing your Medications? N  Managing your Finances? N  Housekeeping or managing your Housekeeping? N    Patient Care Team: Chevis Pretty, FNP as PCP - General (Nurse Practitioner) Raynelle Bring, MD as Consulting Physician (Urology) Harlen Labs, MD as Referring Physician (Optometry)  Indicate any recent Medical Services you may have received from other than [facility] providers in the past year (date may be approximate).     Assessment:   This is a routine wellness examination for [place].  Hearing/Vision screen Hearing Screening - Comments:: Denies hearing difficulties   Vision Screening - Comments:: Wears rx glasses - up to date with routine eye exams with [facility]  Dietary issues and exercise activities discussed: Current Exercise Habits: The patient has a physically strenuous job, but has no regular exercise apart from work., Exercise limited by: orthopedic condition(s)   Goals Addressed             This Visit's Progress    Patient Stated       Hopes to stay as healthy and active as he is now       Depression Screen    05/15/2022    8:24 AM 12/23/2021    2:53 PM 06/25/2021    4:09 PM 06/09/2021    2:44 PM 05/14/2021    8:17 AM 12/23/2020    8:14 AM 06/25/2020   12:38 PM  PHQ 2/9 Scores  PHQ - 2 Score 0 0 0 0 0 0 0  PHQ- 9 Score  0 0    0    Fall Risk    05/15/2022    8:22 AM 12/23/2021    2:53 PM 06/25/2021    4:09 PM 06/09/2021    2:43 PM 05/14/2021    8:20 AM  [place] in the past year? 0 0 0 0 0  Number falls in past yr: 0    0  Injury with Fall? 0    0  Risk for fall due to : Other (Comment)    No Fall Risks  Risk for fall due to: Comment vertigo  intermittently      Follow up Falls prevention discussed    Falls prevention discussed    FALL RISK PREVENTION PERTAINING TO THE HOME:  Any stairs in or around the home? Yes  If so, are there any without handrails? No  Home free of loose throw rugs in walkways, pet beds,  electrical cords, etc? Yes  Adequate lighting in your home to reduce risk of falls? Yes   ASSISTIVE DEVICES UTILIZED TO PREVENT FALLS:  Life alert? No  Use of a cane, walker or w/c? No  Grab bars in the bathroom? No  Shower chair or bench in shower? No  Elevated toilet seat or a handicapped toilet? No   TIMED UP AND GO:  Was the test performed? No . Telephonic visit  Cognitive Function:        05/15/2022    8:27 AM 05/14/2021    8:19 AM  6CIT Screen  What Year? 0 points 0 points  What month? 0 points 0 points  What time? 0 points 0 points  Count back from 20 0 points 0 points  Months in reverse 0 points 2 points  Repeat phrase 0 points 2 points  Total Score 0 points 4 points    Immunizations Immunization History  Administered Date(s) Administered   Influenza,inj,Quad PF,6+ Mos 07/11/2015    TDAP status: Due, Education has been provided regarding the importance of this vaccine. Advised may receive this vaccine at local pharmacy or Health Dept. Aware to provide a copy of the vaccination record if obtained from local pharmacy or Health Dept. Verbalized acceptance and understanding.  Flu Vaccine status: Declined, Education has been provided regarding the importance of this vaccine but patient still declined. Advised may receive this vaccine at local pharmacy or Health Dept. Aware to provide a copy of the vaccination record if obtained from local pharmacy or Health Dept. Verbalized acceptance and understanding.  Pneumococcal vaccine status: Declined,  Education has been provided regarding the importance of this vaccine but patient still declined. Advised may receive this vaccine at local pharmacy or Health Dept. Aware to provide a copy of the vaccination record if obtained from local pharmacy or Health Dept. Verbalized acceptance and understanding.   Covid-19 vaccine status: Declined, Education has been provided regarding the importance of this vaccine but patient still declined.  Advised may receive this vaccine at local pharmacy or Health Dept.or vaccine clinic. Aware to provide a copy of the vaccination record if obtained from local pharmacy or Health Dept. Verbalized acceptance and understanding.  Qualifies for Shingles Vaccine? Yes   Zostavax completed No   Shingrix Completed?: No.    Education has been provided regarding the importance of this vaccine. Patient has been advised to call insurance company to determine out of pocket expense if they have not yet received this vaccine. Advised may also receive vaccine at local pharmacy or Health Dept. Verbalized acceptance and understanding.  Screening Tests Health Maintenance  Topic Date Due   COVID-19 Vaccine (1) Never done   Zoster Vaccines- Shingrix (1 of 2) Never done   TETANUS/TDAP  06/25/2022 (Originally 01/25/1965)   Pneumonia Vaccine 57+ Years old (1 - PCV) 12/24/2022 (Originally 01/24/2009)   INFLUENZA VACCINE  05/19/2022   Hepatitis C Screening  Completed   HPV VACCINES  Aged Out    Health Maintenance  Health Maintenance Due  Topic Date Due   COVID-19 Vaccine (1) Never done   Zoster Vaccines- Shingrix (1 of 2) Never done    Colorectal cancer screening:  No longer required.   Lung Cancer Screening: (Low Dose CT Chest recommended if Age 62-80 years, 30 pack-year currently smoking OR have quit w/in 15years.) does not qualify.   Additional Screening:  Hepatitis C Screening: does qualify; Completed 07/22/2015  Vision Screening: Recommended annual ophthalmology exams for early detection of glaucoma and other disorders of the eye. Is the patient up to date with their annual eye exam?  Yes  Who is the provider or what is the name of the office in which the patient attends annual eye exams? [facility] If pt is not established with a provider, would they like to be referred to a provider to establish care? No .   Dental Screening: Recommended annual dental exams for proper oral  hygiene  Community Resource Referral / Chronic Care Management: CRR required this visit?  No   CCM required this visit?  No      Plan:     I have personally reviewed and noted the following in the patient's chart:   Medical and social history Use of alcohol, tobacco or illicit drugs  Current medications and supplements including opioid prescriptions. Patient is not currently taking opioid prescriptions. Functional ability and status Nutritional status Physical activity Advanced directives List of other physicians Hospitalizations, surgeries, and ER visits in previous 12 months Vitals Screenings to include cognitive, depression, and falls Referrals and appointments  In addition, I have reviewed and discussed with patient certain preventive protocols, quality metrics, and best practice recommendations. A written personalized care plan for preventive services as well as general preventive health recommendations were provided to patient.     Sandrea Hammond, LPN   10/20/5850   Nurse Notes: None

## 2022-05-15 NOTE — Patient Instructions (Signed)
Timothy Duran , Thank you for taking time to come for your Medicare Wellness Visit. I appreciate your ongoing commitment to your health goals. Please review the following plan we discussed and let me know if I can assist you in the future.   Screening recommendations/referrals: Colonoscopy: Done 06/25/2016 - no repeat required Recommended yearly ophthalmology/optometry visit for glaucoma screening and checkup Recommended yearly dental visit for hygiene and checkup  Vaccinations: declines all Influenza vaccine: recommend every Fall Pneumococcal vaccine: recommend once per lifetime Prevnar-20 Tdap vaccine: recommend every 10 years Shingles vaccine: recommend Shingrix which is 2 doses 2-6 months apart and over 90% effective     Covid-19: recommend 2 doses one month apart with a booster 6 months later   Advanced directives: Advance directive discussed with you today. Even though you declined this today, please call our office should you change your mind, and we can give you the proper paperwork for you to fill out.   Conditions/risks identified: Aim for 30 minutes of exercise or brisk walking, 6-8 glasses of water, and 5 servings of fruits and vegetables each day.   Next appointment: Follow up in one year for your annual wellness visit.   Preventive Care 92 Years and Older, Male  Preventive care refers to lifestyle choices and visits with your health care provider that can promote health and wellness. What does preventive care include? A yearly physical exam. This is also called an annual well check. Dental exams once or twice a year. Routine eye exams. Ask your health care provider how often you should have your eyes checked. Personal lifestyle choices, including: Daily care of your teeth and gums. Regular physical activity. Eating a healthy diet. Avoiding tobacco and drug use. Limiting alcohol use. Practicing safe sex. Taking low doses of aspirin every day. Taking vitamin and mineral  supplements as recommended by your health care provider. What happens during an annual well check? The services and screenings done by your health care provider during your annual well check will depend on your age, overall health, lifestyle risk factors, and family history of disease. Counseling  Your health care provider may ask you questions about your: Alcohol use. Tobacco use. Drug use. Emotional well-being. Home and relationship well-being. Sexual activity. Eating habits. History of falls. Memory and ability to understand (cognition). Work and work Statistician. Screening  You may have the following tests or measurements: Height, weight, and BMI. Blood pressure. Lipid and cholesterol levels. These may be checked every 5 years, or more frequently if you are over 46 years old. Skin check. Lung cancer screening. You may have this screening every year starting at age 30 if you have a 30-pack-year history of smoking and currently smoke or have quit within the past 15 years. Fecal occult blood test (FOBT) of the stool. You may have this test every year starting at age 67. Flexible sigmoidoscopy or colonoscopy. You may have a sigmoidoscopy every 5 years or a colonoscopy every 10 years starting at age 73. Prostate cancer screening. Recommendations will vary depending on your family history and other risks. Hepatitis C blood test. Hepatitis B blood test. Sexually transmitted disease (STD) testing. Diabetes screening. This is done by checking your blood sugar (glucose) after you have not eaten for a while (fasting). You may have this done every 1-3 years. Abdominal aortic aneurysm (AAA) screening. You may need this if you are a current or former smoker. Osteoporosis. You may be screened starting at age 67 if you are at high risk. Talk  with your health care provider about your test results, treatment options, and if necessary, the need for more tests. Vaccines  Your health care provider  may recommend certain vaccines, such as: Influenza vaccine. This is recommended every year. Tetanus, diphtheria, and acellular pertussis (Tdap, Td) vaccine. You may need a Td booster every 10 years. Zoster vaccine. You may need this after age 54. Pneumococcal 13-valent conjugate (PCV13) vaccine. One dose is recommended after age 14. Pneumococcal polysaccharide (PPSV23) vaccine. One dose is recommended after age 17. Talk to your health care provider about which screenings and vaccines you need and how often you need them. This information is not intended to replace advice given to you by your health care provider. Make sure you discuss any questions you have with your health care provider. Document Released: 11/01/2015 Document Revised: 06/24/2016 Document Reviewed: 08/06/2015 Elsevier Interactive Patient Education  2017 [place] Prevention in the Home Falls can cause injuries. They can happen to people of all ages. There are many things you can do to make your home safe and to help prevent falls. What can I do on the outside of my home? Regularly fix the edges of walkways and driveways and fix any cracks. Remove anything that might make you trip as you walk through a door, such as a raised step or threshold. Trim any bushes or trees on the path to your home. Use bright outdoor lighting. Clear any walking paths of anything that might make someone trip, such as rocks or tools. Regularly check to see if handrails are loose or broken. Make sure that both sides of any steps have handrails. Any raised decks and porches should have guardrails on the edges. Have any leaves, snow, or ice cleared regularly. Use sand or salt on walking paths during winter. Clean up any spills in your garage right away. This includes oil or grease spills. What can I do in the bathroom? Use night lights. Install grab bars by the toilet and in the tub and shower. Do not use towel bars as grab bars. Use  non-skid mats or decals in the tub or shower. If you need to sit down in the shower, use a plastic, non-slip stool. Keep the floor dry. Clean up any water that spills on the floor as soon as it happens. Remove soap buildup in the tub or shower regularly. Attach bath mats securely with double-sided non-slip rug tape. Do not have throw rugs and other things on the floor that can make you trip. What can I do in the bedroom? Use night lights. Make sure that you have a light by your bed that is easy to reach. Do not use any sheets or blankets that are too big for your bed. They should not hang down onto the floor. Have a firm chair that has side arms. You can use this for support while you get dressed. Do not have throw rugs and other things on the floor that can make you trip. What can I do in the kitchen? Clean up any spills right away. Avoid walking on wet floors. Keep items that you use a lot in easy-to-reach places. If you need to reach something above you, use a strong step stool that has a grab bar. Keep electrical cords out of the way. Do not use floor polish or wax that makes floors slippery. If you must use wax, use non-skid floor wax. Do not have throw rugs and other things on the floor that can make you  trip. What can I do with my stairs? Do not leave any items on the stairs. Make sure that there are handrails on both sides of the stairs and use them. Fix handrails that are broken or loose. Make sure that handrails are as long as the stairways. Check any carpeting to make sure that it is firmly attached to the stairs. Fix any carpet that is loose or worn. Avoid having throw rugs at the top or bottom of the stairs. If you do have throw rugs, attach them to the floor with carpet tape. Make sure that you have a light switch at the top of the stairs and the bottom of the stairs. If you do not have them, ask someone to add them for you. What else can I do to help prevent falls? Wear  shoes that: Do not have high heels. Have rubber bottoms. Are comfortable and fit you well. Are closed at the toe. Do not wear sandals. If you use a stepladder: Make sure that it is fully opened. Do not climb a closed stepladder. Make sure that both sides of the stepladder are locked into place. Ask someone to hold it for you, if possible. Clearly mark and make sure that you can see: Any grab bars or handrails. First and last steps. Where the edge of each step is. Use tools that help you move around (mobility aids) if they are needed. These include: Canes. Walkers. Scooters. Crutches. Turn on the lights when you go into a dark area. Replace any light bulbs as soon as they burn out. Set up your furniture so you have a clear path. Avoid moving your furniture around. If any of your floors are uneven, fix them. If there are any pets around you, be aware of where they are. Review your medicines with your doctor. Some medicines can make you feel dizzy. This can increase your chance of falling. Ask your doctor what other things that you can do to help prevent falls. This information is not intended to replace advice given to you by your health care provider. Make sure you discuss any questions you have with your health care provider. Document Released: 08/01/2009 Document Revised: 03/12/2016 Document Reviewed: 11/09/2014 Elsevier Interactive Patient Education  2017 [employer].

## 2022-06-25 ENCOUNTER — Ambulatory Visit: Payer: Medicare PPO | Admitting: Nurse Practitioner

## 2022-06-29 ENCOUNTER — Encounter: Payer: Self-pay | Admitting: Nurse Practitioner

## 2022-06-29 ENCOUNTER — Ambulatory Visit: Payer: Medicare PPO | Admitting: Nurse Practitioner

## 2022-06-29 VITALS — BP 100/65 | HR 64 | Temp 98.2°F | Resp 20 | Ht 68.0 in | Wt 181.0 lb

## 2022-06-29 DIAGNOSIS — E782 Mixed hyperlipidemia: Secondary | ICD-10-CM

## 2022-06-29 DIAGNOSIS — K219 Gastro-esophageal reflux disease without esophagitis: Secondary | ICD-10-CM

## 2022-06-29 DIAGNOSIS — R42 Dizziness and giddiness: Secondary | ICD-10-CM

## 2022-06-29 DIAGNOSIS — Z8546 Personal history of malignant neoplasm of prostate: Secondary | ICD-10-CM

## 2022-06-29 DIAGNOSIS — I10 Essential (primary) hypertension: Secondary | ICD-10-CM

## 2022-06-29 DIAGNOSIS — Z6828 Body mass index (BMI) 28.0-28.9, adult: Secondary | ICD-10-CM

## 2022-06-29 DIAGNOSIS — G459 Transient cerebral ischemic attack, unspecified: Secondary | ICD-10-CM

## 2022-06-29 MED ORDER — CLOPIDOGREL BISULFATE 75 MG PO TABS: 75.0000 mg | ORAL_TABLET | Freq: Every day | ORAL | 1 refills | Status: DC

## 2022-06-29 MED ORDER — OLMESARTAN MEDOXOMIL-HCTZ 20-12.5 MG PO TABS: 0.5000 | ORAL_TABLET | Freq: Every day | ORAL | 1 refills | Status: DC

## 2022-06-29 NOTE — Progress Notes (Signed)
Subjective:    Patient ID: Timothy Duran, male    DOB: 11-02-1945, 76 y.o.   MRN: [id number]   Chief Complaint: Medical Management of Chronic Issues    HPI:  Timothy Duran is a 76 y.o. who identifies as a male who was assigned male at birth.   Social history: Lives with: wife Work history: retired   Scientist, forensic in today for follow up of the following chronic medical issues:  1. Primary hypertension No c/o chest pain, sob or headache. Doe snot check blood pressure at home. BP Readings from Last 3 Encounters:  06/29/22 100/65  12/23/21 106/63  06/25/21 110/69     2. Mixed hyperlipidemia Does not watch diet. Stays very active. Lab Results  Component Value Date   CHOL 196 12/23/2021   HDL 41 12/23/2021   LDLCALC 119 (H) 12/23/2021   TRIG 203 (H) 12/23/2021   CHOLHDL 4.8 12/23/2021     3. Gastroesophageal reflux disease without esophagitis Is on no prescription meds  4. Vertigo No recent episodes  5. BMI 28.0-28.9,adult Is up 6 lbs Wt Readings from Last 3 Encounters:  06/29/22 181 lb (82.1 kg)  05/15/22 175 lb (79.4 kg)  12/23/21 181 lb (82.1 kg)   BMI Readings from Last 3 Encounters:  06/29/22 27.52 kg/m  05/15/22 26.61 kg/m  12/23/21 27.52 kg/m      New complaints: None today  Allergies  Allergen Reactions   Oxycodone Other (See Comments)    Pt gets very confused and anxious with this medication   Other Other (See Comments)    Nasal packing- headaches and facial pain   Penicillins Other (See Comments)    Blisters  Patient states this was a childhood reaction   Outpatient Encounter Medications as of 06/29/2022  Medication Sig   acetaminophen (TYLENOL) 325 MG tablet Take 2 tablets (650 mg total) by mouth every 6 (six) hours as needed for mild pain (or Fever >/= 101).   Cholecalciferol (VITAMIN D3 PO) Take 1 tablet by mouth daily.   clopidogrel (PLAVIX) 75 MG tablet Take 1 tablet (75 mg total) by mouth daily.   MAGNESIUM PO Take 1 tablet by mouth  daily.   meclizine (ANTIVERT) 25 MG tablet Take 1 tablet (25 mg total) by mouth daily as needed (dizziness, vertigo).   Multiple Vitamins-Minerals (ZINC PO) Take 1 tablet by mouth daily.   olmesartan-hydrochlorothiazide (BENICAR HCT) 20-12.5 MG tablet Take 0.5 tablets by mouth daily.   polyethylene glycol (MIRALAX / GLYCOLAX) 17 g packet Take 17 g by mouth daily as needed for moderate constipation.   No facility-administered encounter medications on file as of 06/29/2022.    Past Surgical History:  Procedure Laterality Date   BASAL CELL CARCINOMA EXCISION Left 2015   "neck"   BLADDER STONE REMOVAL  09/2009   CHOLECYSTECTOMY N/A 05/17/2021   Procedure: LAPAROSCOPIC CHOLECYSTECTOMY;  Surgeon: Timothy Bolt, MD;  Location: [facility];  Service: General;  Laterality: N/A;   [place]   "got one stone; still has one lodge in there" (05/20/2015)   INCISION AND DRAINAGE ABSCESS Left 06/20/2015   Procedure: INCISION AND DRAINAGE ABSCESS;  Surgeon: Timothy Schools, MD;  Location: [facility];  Service: Orthopedics;  Laterality: Left;   KIDNEY STONE SURGERY  1995   KNEE ARTHROTOMY Left 07/20/2015   Procedure: KNEE ARTHROTOMY Incision and Drainage left knee;  Surgeon: Timothy Cancel, MD;  Location: [facility];  Service: Orthopedics;  Laterality: Left;   NASAL ENDOSCOPY WITH EPISTAXIS  CONTROL Left 05/21/2015   Procedure: NASAL ENDOSCOPY WITH EPISTAXIS CONTROL;  Surgeon: Timothy Montane, MD;  Location: [facility];  Service: ENT;  Laterality: Left;   PROSTATECTOMY  01/2010   SHOULDER SURGERY  11/2015   torn ligament   VIDEO ASSISTED THORACOSCOPY (VATS)/WEDGE RESECTION Right 10/2009   "upper; benign"    Family History  Problem Relation Age of Onset   Dementia Mother    Prostate cancer Father        eye, lung, prostate   Prostate cancer Brother    Colon cancer Sister       Controlled substance contract: n/a      Review of Systems  Constitutional:  Negative for diaphoresis.  Eyes:  Negative  for pain.  Respiratory:  Negative for shortness of breath.   Cardiovascular:  Negative for chest pain, palpitations and leg swelling.  Gastrointestinal:  Negative for abdominal pain.  Endocrine: Negative for polydipsia.  Skin:  Negative for rash.  Neurological:  Negative for dizziness, weakness and headaches.  Hematological:  Does not bruise/bleed easily.  All other systems reviewed and are negative.      Objective:   Physical Exam Vitals and nursing note reviewed.  Constitutional:      Appearance: Normal appearance. He is well-developed.  HENT:     Head: Normocephalic.     Nose: Nose normal.     Mouth/Throat:     Mouth: Mucous membranes are moist.     Pharynx: Oropharynx is clear.  Eyes:     Pupils: Pupils are equal, round, and reactive to light.  Neck:     Thyroid: No thyroid mass or thyromegaly.     Vascular: No carotid bruit or JVD.     Trachea: Phonation normal.  Cardiovascular:     Rate and Rhythm: Normal rate and regular rhythm.  Pulmonary:     Effort: Pulmonary effort is normal. No respiratory distress.     Breath sounds: Normal breath sounds.  Abdominal:     General: Bowel sounds are normal.     Palpations: Abdomen is soft.     Tenderness: There is no abdominal tenderness.     Hernia: A hernia (ventral hernia= soft and nontender) is present.  Musculoskeletal:        General: Normal range of motion.     Cervical back: Normal range of motion and neck supple.  Lymphadenopathy:     Cervical: No cervical adenopathy.  Skin:    General: Skin is warm and dry.  Neurological:     Mental Status: He is alert and oriented to person, place, and time.  Psychiatric:        Behavior: Behavior normal.        Thought Content: Thought content normal.        Judgment: Judgment normal.    BP 100/65   Pulse 64   Temp 98.2 F (36.8 C) (Temporal)   Resp 20   Ht '5\' 8"'  (1.727 m)   Wt 181 lb (82.1 kg)   SpO2 96%   BMI 27.52 kg/m         Assessment & Plan:  Timothy Duran comes in today with chief complaint of Medical Management of Chronic Issues   Diagnosis and orders addressed:  1. Primary hypertension Low sodium diet - olmesartan-hydrochlorothiazide (BENICAR HCT) 20-12.5 MG tablet; Take 0.5 tablets by mouth daily.  Dispense: 90 tablet; Refill: 1 - CBC with Differential/Platelet - CMP14+EGFR  2. Mixed hyperlipidemia Low fat diet - Lipid panel  3.  Gastroesophageal reflux disease without esophagitis Avoid spicy foods Do not eat 2 hours prior to bedtime   4. Vertigo  5. BMI 28.0-28.9,adult Discussed diet and exercise for person with BMI >25 Will recheck weight in 3-6 months   6. TIA (transient ischemic attack) - clopidogrel (PLAVIX) 75 MG tablet; Take 1 tablet (75 mg total) by mouth daily.  Dispense: 90 tablet; Refill: 1  7. History of prostate cancer Labs pending - PSA, total and free   Labs pending Health Maintenance reviewed Diet and exercise encouraged  Follow up plan: 6 months   [place], FNP

## 2022-06-29 NOTE — Patient Instructions (Signed)

## 2022-06-30 LAB — CBC WITH DIFFERENTIAL/PLATELET
Basophils Absolute: 0.1 10*3/uL (ref 0.0–0.2)
Basos: 1 %
EOS (ABSOLUTE): 0.4 10*3/uL (ref 0.0–0.4)
Eos: 5 %
Hematocrit: 45.5 % (ref 37.5–51.0)
Hemoglobin: 15.6 g/dL (ref 13.0–17.7)
Immature Grans (Abs): 0 10*3/uL (ref 0.0–0.1)
Immature Granulocytes: 0 %
Lymphocytes Absolute: 2.3 10*3/uL (ref 0.7–3.1)
Lymphs: 28 %
MCH: 31.8 pg (ref 26.6–33.0)
MCHC: 34.3 g/dL (ref 31.5–35.7)
MCV: 93 fL (ref 79–97)
Monocytes Absolute: 0.9 10*3/uL (ref 0.1–0.9)
Monocytes: 10 %
Neutrophils Absolute: 4.6 10*3/uL (ref 1.4–7.0)
Neutrophils: 56 %
Platelets: 250 10*3/uL (ref 150–450)
RBC: 4.91 x10E6/uL (ref 4.14–5.80)
RDW: 13.4 % (ref 11.6–15.4)
WBC: 8.2 10*3/uL (ref 3.4–10.8)

## 2022-06-30 LAB — CMP14+EGFR
ALT: 14 IU/L (ref 0–44)
AST: 16 IU/L (ref 0–40)
Albumin/Globulin Ratio: 2.2 (ref 1.2–2.2)
Albumin: 4.4 g/dL (ref 3.8–4.8)
Alkaline Phosphatase: 92 IU/L (ref 44–121)
BUN/Creatinine Ratio: 17 (ref 10–24)
BUN: 16 mg/dL (ref 8–27)
Bilirubin Total: 0.3 mg/dL (ref 0.0–1.2)
CO2: 24 mmol/L (ref 20–29)
Calcium: 10.4 mg/dL — ABNORMAL HIGH (ref 8.6–10.2)
Chloride: 103 mmol/L (ref 96–106)
Creatinine, Ser: 0.94 mg/dL (ref 0.76–1.27)
Globulin, Total: 2 g/dL (ref 1.5–4.5)
Glucose: 85 mg/dL (ref 70–99)
Potassium: 5 mmol/L (ref 3.5–5.2)
Sodium: 143 mmol/L (ref 134–144)
Total Protein: 6.4 g/dL (ref 6.0–8.5)
eGFR: 83 mL/min/{1.73_m2} (ref >59)

## 2022-06-30 LAB — LIPID PANEL
Chol/HDL Ratio: 4.7 ratio (ref 0.0–5.0)
Cholesterol, Total: 204 mg/dL — ABNORMAL HIGH (ref 100–199)
HDL: 43 mg/dL (ref >39)
LDL Chol Calc (NIH): 131 mg/dL — ABNORMAL HIGH (ref 0–99)
Triglycerides: 166 mg/dL — ABNORMAL HIGH (ref 0–149)
VLDL Cholesterol Cal: 30 mg/dL (ref 5–40)

## 2022-06-30 LAB — PSA, TOTAL AND FREE
PSA, Free: <0.02 ng/mL
Prostate Specific Ag, Serum: <0.1 ng/mL (ref 0.0–4.0)

## 2022-07-31 DIAGNOSIS — H40033 Anatomical narrow angle, bilateral: Secondary | ICD-10-CM | POA: Diagnosis not present

## 2022-07-31 DIAGNOSIS — H04123 Dry eye syndrome of bilateral lacrimal glands: Secondary | ICD-10-CM | POA: Diagnosis not present

## 2022-12-28 ENCOUNTER — Ambulatory Visit (INDEPENDENT_AMBULATORY_CARE_PROVIDER_SITE_OTHER): Payer: Medicare PPO

## 2022-12-28 ENCOUNTER — Ambulatory Visit: Payer: Medicare PPO | Admitting: Nurse Practitioner

## 2022-12-28 ENCOUNTER — Encounter: Payer: Self-pay | Admitting: Nurse Practitioner

## 2022-12-28 VITALS — BP 112/73 | HR 76 | Temp 97.2°F | Resp 20 | Ht 68.0 in | Wt 187.0 lb

## 2022-12-28 DIAGNOSIS — I10 Essential (primary) hypertension: Secondary | ICD-10-CM

## 2022-12-28 DIAGNOSIS — Z0001 Encounter for general adult medical examination with abnormal findings: Secondary | ICD-10-CM | POA: Diagnosis not present

## 2022-12-28 DIAGNOSIS — Z8546 Personal history of malignant neoplasm of prostate: Secondary | ICD-10-CM | POA: Diagnosis not present

## 2022-12-28 DIAGNOSIS — I700 Atherosclerosis of aorta: Secondary | ICD-10-CM | POA: Diagnosis not present

## 2022-12-28 DIAGNOSIS — Z6828 Body mass index (BMI) 28.0-28.9, adult: Secondary | ICD-10-CM | POA: Diagnosis not present

## 2022-12-28 DIAGNOSIS — Z0000 Encounter for general adult medical examination without abnormal findings: Secondary | ICD-10-CM

## 2022-12-28 DIAGNOSIS — E782 Mixed hyperlipidemia: Secondary | ICD-10-CM | POA: Diagnosis not present

## 2022-12-28 DIAGNOSIS — R42 Dizziness and giddiness: Secondary | ICD-10-CM

## 2022-12-28 DIAGNOSIS — G459 Transient cerebral ischemic attack, unspecified: Secondary | ICD-10-CM

## 2022-12-28 MED ORDER — OLMESARTAN MEDOXOMIL-HCTZ 20-12.5 MG PO TABS: 1.0000 | ORAL_TABLET | Freq: Every day | ORAL | 1 refills | Status: DC

## 2022-12-28 MED ORDER — CLOPIDOGREL BISULFATE 75 MG PO TABS: 75.0000 mg | ORAL_TABLET | Freq: Every day | ORAL | 1 refills | Status: DC

## 2022-12-28 NOTE — Patient Instructions (Signed)
Fall Prevention in the Home, Adult Falls can cause injuries and can happen to people of all ages. There are many things you can do to make your home safer and to help prevent falls. What actions can I take to prevent falls? General information Use good lighting in all rooms. Make sure to: Replace any light bulbs that burn out. Turn on the lights in dark areas and use night-lights. Keep items that you use often in easy-to-reach places. Lower the shelves around your home if needed. Move furniture so that there are clear paths around it. Do not use throw rugs or other things on the floor that can make you trip. If any of your floors are uneven, fix them. Add color or contrast paint or tape to clearly mark and help you see: Grab bars or handrails. First and last steps of staircases. Where the edge of each step is. If you use a ladder or stepladder: Make sure that it is fully opened. Do not climb a closed ladder. Make sure the sides of the ladder are locked in place. Have someone hold the ladder while you use it. Know where your pets are as you move through your home. What can I do in the bathroom?     Keep the floor dry. Clean up any water on the floor right away. Remove soap buildup in the bathtub or shower. Buildup makes bathtubs and showers slippery. Use non-skid mats or decals on the floor of the bathtub or shower. Attach bath mats securely with double-sided, non-slip rug tape. If you need to sit down in the shower, use a non-slip stool. Install grab bars by the toilet and in the bathtub and shower. Do not use towel bars as grab bars. What can I do in the bedroom? Make sure that you have a light by your bed that is easy to reach. Do not use any sheets or blankets on your bed that hang to the floor. Have a firm chair or bench with side arms that you can use for support when you get dressed. What can I do in the kitchen? Clean up any spills right away. If you need to reach something  above you, use a step stool with a grab bar. Keep electrical cords out of the way. Do not use floor polish or wax that makes floors slippery. What can I do with my stairs? Do not leave anything on the stairs. Make sure that you have a light switch at the top and the bottom of the stairs. Make sure that there are handrails on both sides of the stairs. Fix handrails that are broken or loose. Install non-slip stair treads on all your stairs if they do not have carpet. Avoid having throw rugs at the top or bottom of the stairs. Choose a carpet that does not hide the edge of the steps on the stairs. Make sure that the carpet is firmly attached to the stairs. Fix carpet that is loose or worn. What can I do on the outside of my home? Use bright outdoor lighting. Fix the edges of walkways and driveways and fix any cracks. Clear paths of anything that can make you trip, such as tools or rocks. Add color or contrast paint or tape to clearly mark and help you see anything that might make you trip as you walk through a door, such as a raised step or threshold. Trim any bushes or trees on paths to your home. Check to see if handrails are loose   or broken and that both sides of all steps have handrails. Install guardrails along the edges of any raised decks and porches. Have leaves, snow, or ice cleared regularly. Use sand, salt, or ice melter on paths if you live where there is ice and snow during the winter. Clean up any spills in your garage right away. This includes grease or oil spills. What other actions can I take? Review your medicines with your doctor. Some medicines can cause dizziness or changes in blood pressure, which increase your risk of falling. Wear shoes that: Have a low heel. Do not wear high heels. Have rubber bottoms and are closed at the toe. Feel good on your feet and fit well. Use tools that help you move around if needed. These include: Canes. Walkers. Scooters. Crutches. Ask  your doctor what else you can do to help prevent falls. This may include seeing a physical therapist to learn to do exercises to move better and get stronger. Where to find more information Centers for Disease Control and Prevention, [employer]: cdc.gov National Institute on Aging: nia.nih.gov National Institute on Aging: nia.nih.gov Contact a doctor if: You are afraid of falling at home. You feel weak, drowsy, or dizzy at home. You fall at home. Get help right away if you: Lose consciousness or have trouble moving after a fall. Have a fall that causes a head injury. These symptoms may be an emergency. Get help right away. Call 911. Do not wait to see if the symptoms will go away. Do not drive yourself to the hospital. This information is not intended to replace advice given to you by your health care provider. Make sure you discuss any questions you have with your health care provider. Document Revised: 06/08/2022 Document Reviewed: 06/08/2022 Elsevier Patient Education  2023 Elsevier Inc.  

## 2022-12-28 NOTE — Progress Notes (Signed)
Subjective:    Patient ID: Timothy Duran, male    DOB: Jan 09, 1946, 77 y.o.   MRN: [id number]   Chief Complaint: annual physical   HPI:  Timothy Duran is a 77 y.o. who identifies as a male who was assigned male at birth.   Social history: Lives with: wife Work history: retired   Scientist, forensic in today for follow up of the following chronic medical issues:  1. Annual physical exam  2. Primary hypertension No c/o chest pain, sob or headache. Doe snot check blood pressure at home. BP Readings from Last 3 Encounters:  06/29/22 100/65  12/23/21 106/63  06/25/21 110/69     3. Mixed hyperlipidemia Does not really watch diet very closely. No dedicated exercise. Refuses statin therapy. The 10-year ASCVD risk score (Arnett DK, et al., 2019) is: 24%  Lab Results  Component Value Date   CHOL 204 (H) 06/29/2022   HDL 43 06/29/2022   LDLCALC 131 (H) 06/29/2022   TRIG 166 (H) 06/29/2022   CHOLHDL 4.7 06/29/2022     4. TIA (transient ischemic attack) No permanent effects  5. Vertigo Has only had one episode since last visit. Meclizine helps  6. BMI 28.0-28.9,adult Weight is up 6 lbs Wt Readings from Last 3 Encounters:  12/28/22 187 lb (84.8 kg)  06/29/22 181 lb (82.1 kg)  05/15/22 175 lb (79.4 kg)   BMI Readings from Last 3 Encounters:  12/28/22 28.43 kg/m  06/29/22 27.52 kg/m  05/15/22 26.61 kg/m     7. History of prostate cancer No voiding issues   New complaints: None  today  Allergies  Allergen Reactions   Oxycodone Other (See Comments)    Pt gets very confused and anxious with this medication   Other Other (See Comments)    Nasal packing- headaches and facial pain   Penicillins Other (See Comments)    Blisters  Patient states this was a childhood reaction   Outpatient Encounter Medications as of 12/28/2022  Medication Sig   acetaminophen (TYLENOL) 325 MG tablet Take 2 tablets (650 mg total) by mouth every 6 (six) hours as needed for mild pain (or Fever  >/= 101).   Cholecalciferol (VITAMIN D3 PO) Take 1 tablet by mouth daily.   clopidogrel (PLAVIX) 75 MG tablet Take 1 tablet (75 mg total) by mouth daily.   MAGNESIUM PO Take 1 tablet by mouth daily.   meclizine (ANTIVERT) 25 MG tablet Take 1 tablet (25 mg total) by mouth daily as needed (dizziness, vertigo).   Multiple Vitamins-Minerals (ZINC PO) Take 1 tablet by mouth daily.   olmesartan-hydrochlorothiazide (BENICAR HCT) 20-12.5 MG tablet Take 0.5 tablets by mouth daily.   polyethylene glycol (MIRALAX / GLYCOLAX) 17 g packet Take 17 g by mouth daily as needed for moderate constipation.   No facility-administered encounter medications on file as of 12/28/2022.    Past Surgical History:  Procedure Laterality Date   BASAL CELL CARCINOMA EXCISION Left 2015   "neck"   BLADDER STONE REMOVAL  09/2009   CHOLECYSTECTOMY N/A 05/17/2021   Procedure: LAPAROSCOPIC CHOLECYSTECTOMY;  Surgeon: Dwan Bolt, MD;  Location: [facility];  Service: General;  Laterality: N/A;   [place]   "got one stone; still has one lodge in there" (05/20/2015)   INCISION AND DRAINAGE ABSCESS Left 06/20/2015   Procedure: INCISION AND DRAINAGE ABSCESS;  Surgeon: Melina Schools, MD;  Location: [facility];  Service: Orthopedics;  Laterality: Left;   [place]  KNEE ARTHROTOMY Left 07/20/2015   Procedure: KNEE ARTHROTOMY Incision and Drainage left knee;  Surgeon: Paralee Cancel, MD;  Location: [facility];  Service: Orthopedics;  Laterality: Left;   NASAL ENDOSCOPY WITH EPISTAXIS CONTROL Left 05/21/2015   Procedure: NASAL ENDOSCOPY WITH EPISTAXIS CONTROL;  Surgeon: Melissa Montane, MD;  Location: [facility];  Service: ENT;  Laterality: Left;   PROSTATECTOMY  01/2010   SHOULDER SURGERY  11/2015   torn ligament   VIDEO ASSISTED THORACOSCOPY (VATS)/WEDGE RESECTION Right 10/2009   "upper; benign"    Family History  Problem Relation Age of Onset   Dementia Mother    Prostate cancer Father        eye, lung,  prostate   Prostate cancer Brother    Colon cancer Sister       Controlled substance contract: n/a     Review of Systems  Constitutional:  Negative for diaphoresis.  Eyes:  Negative for pain.  Respiratory:  Negative for shortness of breath.   Cardiovascular:  Negative for chest pain, palpitations and leg swelling.  Gastrointestinal:  Negative for abdominal pain.  Endocrine: Negative for polydipsia.  Skin:  Negative for rash.  Neurological:  Negative for dizziness, weakness and headaches.  Hematological:  Does not bruise/bleed easily.  All other systems reviewed and are negative.      Objective:   Physical Exam Vitals and nursing note reviewed.  Constitutional:      Appearance: Normal appearance. He is well-developed.  HENT:     Head: Normocephalic.     Nose: Nose normal.     Mouth/Throat:     Mouth: Mucous membranes are moist.     Pharynx: Oropharynx is clear.  Eyes:     Pupils: Pupils are equal, round, and reactive to light.  Neck:     Thyroid: No thyroid mass or thyromegaly.     Vascular: No carotid bruit or JVD.     Trachea: Phonation normal.  Cardiovascular:     Rate and Rhythm: Normal rate and regular rhythm.  Pulmonary:     Effort: Pulmonary effort is normal. No respiratory distress.     Breath sounds: Normal breath sounds.  Abdominal:     General: Bowel sounds are normal.     Palpations: Abdomen is soft.     Tenderness: There is no abdominal tenderness.     Hernia: A hernia (ventral hernia- soft and nontender) is present.  Musculoskeletal:        General: Normal range of motion.     Cervical back: Normal range of motion and neck supple.  Lymphadenopathy:     Cervical: No cervical adenopathy.  Skin:    General: Skin is warm and dry.  Neurological:     Mental Status: He is alert and oriented to person, place, and time.  Psychiatric:        Behavior: Behavior normal.        Thought Content: Thought content normal.        Judgment: Judgment normal.     BP 112/73   Pulse 76   Temp (!) 97.2 F (36.2 C) (Temporal)   Resp 20   Ht '5\' 8"'$  (1.727 m)   Wt 187 lb (84.8 kg)   SpO2 94%   BMI 28.43 kg/m         Assessment & Plan:  Timothy Duran comes in today with chief complaint of Annual Exam   Diagnosis and orders addressed:  1. Annual physical exam   2. Primary hypertension Low sodium  diet - DG Chest 2 View - EKG 12-Lead - CBC with Differential/Platelet - CMP14+EGFR  3. Mixed hyperlipidemia Low fat diet - Lipid panel  4. TIA (transient ischemic attack) - clopidogrel (PLAVIX) 75 MG tablet; Take 1 tablet (75 mg total) by mouth daily.  Dispense: 90 tablet; Refill: 1  5. Vertigo Report any dizziness  6. BMI 28.0-28.9,adult Discussed diet and exercise for person with BMI >25 Will recheck weight in 3-6 months   7. History of prostate cancer Will repeat PSA later this year   Labs pending Health Maintenance reviewed Diet and exercise encouraged  Follow up plan: 6 months   [place], FNP

## 2022-12-28 NOTE — Addendum Note (Signed)
Addended by: Rolena Infante on: 12/28/2022 02:38 PM   Modules accepted: Orders

## 2022-12-29 LAB — CMP14+EGFR
ALT: 15 IU/L (ref 0–44)
AST: 16 IU/L (ref 0–40)
Albumin/Globulin Ratio: 2 (ref 1.2–2.2)
Albumin: 4.3 g/dL (ref 3.8–4.8)
Alkaline Phosphatase: 91 IU/L (ref 44–121)
BUN/Creatinine Ratio: 18 (ref 10–24)
BUN: 18 mg/dL (ref 8–27)
Bilirubin Total: 0.3 mg/dL (ref 0.0–1.2)
CO2: 25 mmol/L (ref 20–29)
Calcium: 10.3 mg/dL — ABNORMAL HIGH (ref 8.6–10.2)
Chloride: 103 mmol/L (ref 96–106)
Creatinine, Ser: 0.98 mg/dL (ref 0.76–1.27)
Globulin, Total: 2.1 g/dL (ref 1.5–4.5)
Glucose: 101 mg/dL — ABNORMAL HIGH (ref 70–99)
Potassium: 4.6 mmol/L (ref 3.5–5.2)
Sodium: 139 mmol/L (ref 134–144)
Total Protein: 6.4 g/dL (ref 6.0–8.5)
eGFR: 79 mL/min/{1.73_m2} (ref >59)

## 2022-12-29 LAB — CBC WITH DIFFERENTIAL/PLATELET
Basophils Absolute: 0.1 10*3/uL (ref 0.0–0.2)
Basos: 1 %
EOS (ABSOLUTE): 0.2 10*3/uL (ref 0.0–0.4)
Eos: 3 %
Hematocrit: 45.5 % (ref 37.5–51.0)
Hemoglobin: 15.5 g/dL (ref 13.0–17.7)
Immature Grans (Abs): 0 10*3/uL (ref 0.0–0.1)
Immature Granulocytes: 0 %
Lymphocytes Absolute: 2.4 10*3/uL (ref 0.7–3.1)
Lymphs: 28 %
MCH: 31.9 pg (ref 26.6–33.0)
MCHC: 34.1 g/dL (ref 31.5–35.7)
MCV: 94 fL (ref 79–97)
Monocytes Absolute: 0.8 10*3/uL (ref 0.1–0.9)
Monocytes: 9 %
Neutrophils Absolute: 4.8 10*3/uL (ref 1.4–7.0)
Neutrophils: 59 %
Platelets: 236 10*3/uL (ref 150–450)
RBC: 4.86 x10E6/uL (ref 4.14–5.80)
RDW: 13.1 % (ref 11.6–15.4)
WBC: 8.3 10*3/uL (ref 3.4–10.8)

## 2022-12-29 LAB — PSA, TOTAL AND FREE
PSA, Free: <0.02 ng/mL
Prostate Specific Ag, Serum: <0.1 ng/mL (ref 0.0–4.0)

## 2022-12-29 LAB — LIPID PANEL
Chol/HDL Ratio: 4.9 ratio (ref 0.0–5.0)
Cholesterol, Total: 205 mg/dL — ABNORMAL HIGH (ref 100–199)
HDL: 42 mg/dL (ref >39)
LDL Chol Calc (NIH): 131 mg/dL — ABNORMAL HIGH (ref 0–99)
Triglycerides: 181 mg/dL — ABNORMAL HIGH (ref 0–149)
VLDL Cholesterol Cal: 32 mg/dL (ref 5–40)

## 2022-12-31 ENCOUNTER — Telehealth: Payer: Self-pay | Admitting: Nurse Practitioner

## 2022-12-31 NOTE — Telephone Encounter (Signed)
Called and spoke to [facility] radiology - issue fixed

## 2023-05-19 ENCOUNTER — Ambulatory Visit: Payer: Medicare PPO

## 2023-05-19 VITALS — Ht 68.0 in | Wt 178.0 lb

## 2023-05-19 DIAGNOSIS — Z0000 Encounter for general adult medical examination without abnormal findings: Secondary | ICD-10-CM | POA: Diagnosis not present

## 2023-05-19 NOTE — Patient Instructions (Signed)
Timothy Duran , Thank you for taking time to come for your Medicare Wellness Visit. I appreciate your ongoing commitment to your health goals. Please review the following plan we discussed and let me know if I can assist you in the future.   Referrals/Orders/Follow-Ups/Clinician Recommendations: Aim for 30 minutes of exercise or brisk walking, 6-8 glasses of water, and 5 servings of fruits and vegetables each day.   This is a list of the screening recommended for you and due dates:  Health Maintenance  Topic Date Due   DTaP/Tdap/Td vaccine (1 - Tdap) Never done   Zoster (Shingles) Vaccine (1 of 2) Never done   Pneumonia Vaccine (1 of 1 - PCV) Never done   COVID-19 Vaccine (1 - 2023-24 season) Never done   Flu Shot  05/20/2023   Medicare Annual Wellness Visit  05/18/2024   Hepatitis C Screening  Completed   HPV Vaccine  Aged Out    Advanced directives: (Provided) Advance directive discussed with you today. I have provided a copy for you to complete at home and have notarized. Once this is complete, please bring a copy in to our office so we can scan it into your chart.  Information on Advanced Care Planning can be found at [facility] Advance Health Care Directives Advance Health Care Directives ([email or web address]    Next Medicare Annual Wellness Visit scheduled for next year: No  Preventive Care 65 Years and Older, Male  Preventive care refers to lifestyle choices and visits with your health care provider that can promote health and wellness. What does preventive care include? A yearly physical exam. This is also called an annual well check. Dental exams once or twice a year. Routine eye exams. Ask your health care provider how often you should have your eyes checked. Personal lifestyle choices, including: Daily care of your teeth and gums. Regular physical activity. Eating a healthy diet. Avoiding tobacco and drug use. Limiting alcohol use. Practicing safe  sex. Taking low doses of aspirin every day. Taking vitamin and mineral supplements as recommended by your health care provider. What happens during an annual well check? The services and screenings done by your health care provider during your annual well check will depend on your age, overall health, lifestyle risk factors, and family history of disease. Counseling  Your health care provider may ask you questions about your: Alcohol use. Tobacco use. Drug use. Emotional well-being. Home and relationship well-being. Sexual activity. Eating habits. History of falls. Memory and ability to understand (cognition). Work and work Astronomer. Screening  You may have the following tests or measurements: Height, weight, and BMI. Blood pressure. Lipid and cholesterol levels. These may be checked every 5 years, or more frequently if you are over 37 years old. Skin check. Lung cancer screening. You may have this screening every year starting at age 33 if you have a 30-pack-year history of smoking and currently smoke or have quit within the past 15 years. Fecal occult blood test (FOBT) of the stool. You may have this test every year starting at age 18. Flexible sigmoidoscopy or colonoscopy. You may have a sigmoidoscopy every 5 years or a colonoscopy every 10 years starting at age 46. Prostate cancer screening. Recommendations will vary depending on your family history and other risks. Hepatitis C blood test. Hepatitis B blood test. Sexually transmitted disease (STD) testing. Diabetes screening. This is done by checking your blood sugar (glucose) after you have not eaten for a while (fasting). You may have  this done every 1-3 years. Abdominal aortic aneurysm (AAA) screening. You may need this if you are a current or former smoker. Osteoporosis. You may be screened starting at age 81 if you are at high risk. Talk with your health care provider about your test results, treatment options, and if  necessary, the need for more tests. Vaccines  Your health care provider may recommend certain vaccines, such as: Influenza vaccine. This is recommended every year. Tetanus, diphtheria, and acellular pertussis (Tdap, Td) vaccine. You may need a Td booster every 10 years. Zoster vaccine. You may need this after age 30. Pneumococcal 13-valent conjugate (PCV13) vaccine. One dose is recommended after age 83. Pneumococcal polysaccharide (PPSV23) vaccine. One dose is recommended after age 22. Talk to your health care provider about which screenings and vaccines you need and how often you need them. This information is not intended to replace advice given to you by your health care provider. Make sure you discuss any questions you have with your health care provider. Document Released: 11/01/2015 Document Revised: 06/24/2016 Document Reviewed: 08/06/2015 Elsevier Interactive Patient Education  2017 [employer].  Fall Prevention in the Home Falls can cause injuries. They can happen to people of all ages. There are many things you can do to make your home safe and to help prevent falls. What can I do on the outside of my home? Regularly fix the edges of walkways and driveways and fix any cracks. Remove anything that might make you trip as you walk through a door, such as a raised step or threshold. Trim any bushes or trees on the path to your home. Use bright outdoor lighting. Clear any walking paths of anything that might make someone trip, such as rocks or tools. Regularly check to see if handrails are loose or broken. Make sure that both sides of any steps have handrails. Any raised decks and porches should have guardrails on the edges. Have any leaves, snow, or ice cleared regularly. Use sand or salt on walking paths during winter. Clean up any spills in your garage right away. This includes oil or grease spills. What can I do in the bathroom? Use night lights. Install grab bars by the toilet  and in the tub and shower. Do not use towel bars as grab bars. Use non-skid mats or decals in the tub or shower. If you need to sit down in the shower, use a plastic, non-slip stool. Keep the floor dry. Clean up any water that spills on the floor as soon as it happens. Remove soap buildup in the tub or shower regularly. Attach bath mats securely with double-sided non-slip rug tape. Do not have throw rugs and other things on the floor that can make you trip. What can I do in the bedroom? Use night lights. Make sure that you have a light by your bed that is easy to reach. Do not use any sheets or blankets that are too big for your bed. They should not hang down onto the floor. Have a firm chair that has side arms. You can use this for support while you get dressed. Do not have throw rugs and other things on the floor that can make you trip. What can I do in the kitchen? Clean up any spills right away. Avoid walking on wet floors. Keep items that you use a lot in easy-to-reach places. If you need to reach something above you, use a strong step stool that has a grab bar. Keep electrical cords out  of the way. Do not use floor polish or wax that makes floors slippery. If you must use wax, use non-skid floor wax. Do not have throw rugs and other things on the floor that can make you trip. What can I do with my stairs? Do not leave any items on the stairs. Make sure that there are handrails on both sides of the stairs and use them. Fix handrails that are broken or loose. Make sure that handrails are as long as the stairways. Check any carpeting to make sure that it is firmly attached to the stairs. Fix any carpet that is loose or worn. Avoid having throw rugs at the top or bottom of the stairs. If you do have throw rugs, attach them to the floor with carpet tape. Make sure that you have a light switch at the top of the stairs and the bottom of the stairs. If you do not have them, ask someone to add  them for you. What else can I do to help prevent falls? Wear shoes that: Do not have high heels. Have rubber bottoms. Are comfortable and fit you well. Are closed at the toe. Do not wear sandals. If you use a stepladder: Make sure that it is fully opened. Do not climb a closed stepladder. Make sure that both sides of the stepladder are locked into place. Ask someone to hold it for you, if possible. Clearly mark and make sure that you can see: Any grab bars or handrails. First and last steps. Where the edge of each step is. Use tools that help you move around (mobility aids) if they are needed. These include: Canes. Walkers. Scooters. Crutches. Turn on the lights when you go into a dark area. Replace any light bulbs as soon as they burn out. Set up your furniture so you have a clear path. Avoid moving your furniture around. If any of your floors are uneven, fix them. If there are any pets around you, be aware of where they are. Review your medicines with your doctor. Some medicines can make you feel dizzy. This can increase your chance of falling. Ask your doctor what other things that you can do to help prevent falls. This information is not intended to replace advice given to you by your health care provider. Make sure you discuss any questions you have with your health care provider. Document Released: 08/01/2009 Document Revised: 03/12/2016 Document Reviewed: 11/09/2014 Elsevier Interactive Patient Education  2017 [employer].

## 2023-05-19 NOTE — Progress Notes (Signed)
Subjective:   Timothy Duran is a 77 y.o. male who presents for Medicare Annual/Subsequent preventive examination.  Visit Complete: Virtual  I connected with  Ezzie Dural on 05/19/23 by a audio enabled telemedicine application and verified that I am speaking with the correct person using two identifiers.  Patient Location: Home  Provider Location: Home Office  I discussed the limitations of evaluation and management by telemedicine. The patient expressed understanding and agreed to proceed.  Patient Medicare AWV questionnaire was completed by the patient on 05/19/2023; I have confirmed that all information answered by patient is correct and no changes since this date.  Review of Systems    Vital Signs: Unable to obtain new vitals due to this being a telehealth visit.  Cardiac Risk Factors include: advanced age (>30men, >61 women);male gender;hypertension     Objective:    Today's Vitals   05/19/23 0819  Weight: 178 lb (80.7 kg)  Height: 5\' 8"  (1.727 m)   Body mass index is 27.06 kg/m.     05/19/2023    8:24 AM 05/15/2022    8:26 AM 05/15/2021    6:23 PM 05/14/2021    8:20 AM 04/06/2016    8:59 AM 12/10/2015    9:12 AM 07/20/2015    8:20 PM  Advanced Directives  Does Patient Have a Medical Advance Directive? No No No No No No No  Would patient like information on creating a medical advance directive? Yes (MAU/Ambulatory/Procedural Areas - Information given) No - Patient declined No - Patient declined No - Patient declined Yes - Educational materials given No - patient declined information No - patient declined information    Current Medications (verified) Outpatient Encounter Medications as of 05/19/2023  Medication Sig   acetaminophen (TYLENOL) 325 MG tablet Take 2 tablets (650 mg total) by mouth every 6 (six) hours as needed for mild pain (or Fever >/= 101).   Cholecalciferol (VITAMIN D3 PO) Take 1 tablet by mouth daily.   clopidogrel (PLAVIX) 75 MG tablet Take 1 tablet  (75 mg total) by mouth daily.   MAGNESIUM PO Take 1 tablet by mouth daily.   meclizine (ANTIVERT) 25 MG tablet Take 1 tablet (25 mg total) by mouth daily as needed (dizziness, vertigo).   Multiple Vitamins-Minerals (ZINC PO) Take 1 tablet by mouth daily.   olmesartan-hydrochlorothiazide (BENICAR HCT) 20-12.5 MG tablet Take 1 tablet by mouth daily.   polyethylene glycol (MIRALAX / GLYCOLAX) 17 g packet Take 17 g by mouth daily as needed for moderate constipation.   No facility-administered encounter medications on file as of 05/19/2023.    Allergies (verified) Oxycodone, Other, and Penicillins   History: Past Medical History:  Diagnosis Date   Abdominal hernia    "in there now" (05/20/2015)   Arthritis    "terrible; all over" (05/20/2015)   Basal cell carcinoma    "left neck"   Diverticulosis    Epistaxis 05/20/2015   hospitalized   Hypercholesterolemia    Hypertension    Kidney stone    Prostate cancer (HCC) 2010   prostatectomy   TIA (transient ischemic attack) 2005   ?   Past Surgical History:  Procedure Laterality Date   BASAL CELL CARCINOMA EXCISION Left 2015   "neck"   BLADDER STONE REMOVAL  09/2009   CHOLECYSTECTOMY N/A 05/17/2021   Procedure: LAPAROSCOPIC CHOLECYSTECTOMY;  Surgeon: Fritzi Mandes, MD;  Location: [facility];  Service: General;  Laterality: N/A;   CYSTOSCOPY W/ STONE MANIPULATION  1994   "got one  stone; still has one lodge in there" (05/20/2015)   INCISION AND DRAINAGE ABSCESS Left 06/20/2015   Procedure: INCISION AND DRAINAGE ABSCESS;  Surgeon: Venita Lick, MD;  Location: [facility];  Service: Orthopedics;  Laterality: Left;   KIDNEY STONE SURGERY  1995   KNEE ARTHROTOMY Left 07/20/2015   Procedure: KNEE ARTHROTOMY Incision and Drainage left knee;  Surgeon: Durene Romans, MD;  Location: [facility];  Service: Orthopedics;  Laterality: Left;   NASAL ENDOSCOPY WITH EPISTAXIS CONTROL Left 05/21/2015   Procedure: NASAL ENDOSCOPY WITH EPISTAXIS CONTROL;  Surgeon: Suzanna Obey,  MD;  [facility];  Service: ENT;  Laterality: Left;   PROSTATECTOMY  01/2010   SHOULDER SURGERY  11/2015   torn ligament   VIDEO ASSISTED THORACOSCOPY (VATS)/WEDGE RESECTION Right 10/2009   "upper; benign"   Family History  Problem Relation Age of Onset   Dementia Mother    Prostate cancer Father        eye, lung, prostate   Prostate cancer Brother    Colon cancer Sister    Social History   Socioeconomic History   Marital status: Married    Spouse name: Darel Hong   Number of children: 2   Years of education: 12   Highest education level: Not on file  Occupational History   Occupation: retired    Comment: part time- Production designer, theatre/television/film for [employer]  Tobacco Use   Smoking status: Former    Current packs/day: 0.00    Average packs/day: 1 pack/day for 4.0 years (4.0 ttl pk-yrs)    Types: Cigarettes    Start date: 08/29/2004    Quit date: 08/29/2008    Years since quitting: 14.7   Smokeless tobacco: Never  Vaping Use   Vaping status: Never Used  Substance and Sexual Activity   Alcohol use: No    Alcohol/week: 0.0 standard drinks of alcohol   Drug use: No   Sexual activity: Not on file  Other Topics Concern   Not on file  Social History Narrative   Lives with wife - 2 daughters live nearby   'caffeine use- coffee, 2 cups a day, occas tea   Social Determinants of Health   Financial Resource Strain: Low Risk  (05/19/2023)   Overall Financial Resource Strain (CARDIA)    Difficulty of Paying Living Expenses: Not hard at all  Food Insecurity: No Food Insecurity (05/19/2023)   Hunger Vital Sign    Worried About Running Out of Food in the Last Year: Never true    Ran Out of Food in the Last Year: Never true  Transportation Needs: No Transportation Needs (05/19/2023)   PRAPARE - Administrator, Civil Service (Medical): No    Lack of Transportation (Non-Medical): No  Physical Activity: Sufficiently Active (05/19/2023)   Exercise Vital Sign    Days of Exercise per Week: 5  days    Minutes of Exercise per Session: 30 min  Stress: No Stress Concern Present (05/19/2023)   [employer] of Occupational Health - Occupational Stress Questionnaire    Feeling of Stress : Not at all  Social Connections: Moderately Isolated (05/19/2023)   Social Connection and Isolation Panel [NHANES]    Frequency of Communication with Friends and Family: More than three times a week    Frequency of Social Gatherings with Friends and Family: More than three times a week    Attends Religious Services: More than 4 times per year    Active Member of Clubs or Organizations: No  Attends Banker Meetings: Never    Marital Status: Widowed    Tobacco Counseling Counseling given: Not Answered   Clinical Intake:  Pre-visit preparation completed: Yes  Pain : No/denies pain     Nutritional Risks: None Diabetes: No  How often do you need to have someone help you when you read instructions, pamphlets, or other written materials from your doctor or pharmacy?: 1 - Never  Interpreter Needed?: No  Information entered by :: Renie Ora, LPN   Activities of Daily Living    05/19/2023    8:24 AM  In your present state of health, do you have any difficulty performing the following activities:  Hearing? 0  Vision? 0  Difficulty concentrating or making decisions? 0  Walking or climbing stairs? 0  Dressing or bathing? 0  Doing errands, shopping? 0  Preparing Food and eating ? N  Using the Toilet? N  In the past six months, have you accidently leaked urine? N  Do you have problems with loss of bowel control? N  Managing your Medications? N  Managing your Finances? N  Housekeeping or managing your Housekeeping? N    Patient Care Team: Bennie Pierini, FNP as PCP - General (Nurse Practitioner) Heloise Purpura, MD as Consulting Physician (Urology) Michaelle Copas, MD as Referring Physician (Optometry)  Indicate any recent Medical Services you may have  received from other than [facility] providers in the past year (date may be approximate).     Assessment:   This is a routine wellness examination for Timothy Duran.  Hearing/Vision screen Vision Screening - Comments:: Wears rx glasses - up to date with routine eye exams with  Dr.Lee   Dietary issues and exercise activities discussed:     Goals Addressed             This Visit's Progress    Patient Stated   On track    Hopes to stay as healthy and active as he is now       Depression Screen    05/19/2023    8:21 AM 12/28/2022    2:10 PM 06/29/2022    2:13 PM 05/15/2022    8:24 AM 12/23/2021    2:53 PM 06/25/2021    4:09 PM 06/09/2021    2:44 PM  PHQ 2/9 Scores  PHQ - 2 Score 0 0 0 0 0 0 0  PHQ- 9 Score  0 0  0 0     Fall Risk    05/19/2023    8:20 AM 12/28/2022    2:10 PM 06/29/2022    2:13 PM 05/15/2022    8:22 AM 12/23/2021    2:53 PM  Fall Risk   Falls in the past year? 0 0 0 0 0  Number falls in past yr: 0   0   Injury with Fall? 0   0   Risk for fall due to : No Fall Risks   Other (Comment)   Risk for fall due to: Comment    vertigo intermittently   Follow up Falls prevention discussed   Falls prevention discussed     MEDICARE RISK AT HOME:  Medicare Risk at Home - 05/19/23 0820     Any stairs in or around the home? Yes    If so, are there any without handrails? No    Home free of loose throw rugs in walkways, pet beds, electrical cords, etc? Yes    Adequate lighting in your home to reduce risk of falls?  Yes    Life alert? No    Use of a cane, walker or w/c? No    Grab bars in the bathroom? Yes    Shower chair or bench in shower? Yes    Elevated toilet seat or a handicapped toilet? Yes             TIMED UP AND GO:  Was the test performed?  No    Cognitive Function:        05/19/2023    8:24 AM 05/15/2022    8:27 AM 05/14/2021    8:19 AM  6CIT Screen  What Year? 0 points 0 points 0 points  What month? 0 points 0 points 0 points  What time? 0 points 0 points  0 points  Count back from 20 0 points 0 points 0 points  Months in reverse 0 points 0 points 2 points  Repeat phrase 0 points 0 points 2 points  Total Score 0 points 0 points 4 points    Immunizations Immunization History  Administered Date(s) Administered   Influenza,inj,Quad PF,6+ Mos 07/11/2015    TDAP status: Due, Education has been provided regarding the importance of this vaccine. Advised may receive this vaccine at local pharmacy or Health Dept. Aware to provide a copy of the vaccination record if obtained from local pharmacy or Health Dept. Verbalized acceptance and understanding.  Flu Vaccine status: Declined, Education has been provided regarding the importance of this vaccine but patient still declined. Advised may receive this vaccine at local pharmacy or Health Dept. Aware to provide a copy of the vaccination record if obtained from local pharmacy or Health Dept. Verbalized acceptance and understanding.  Pneumococcal vaccine status: Declined,  Education has been provided regarding the importance of this vaccine but patient still declined. Advised may receive this vaccine at local pharmacy or Health Dept. Aware to provide a copy of the vaccination record if obtained from local pharmacy or Health Dept. Verbalized acceptance and understanding.   Covid-19 vaccine status: Declined, Education has been provided regarding the importance of this vaccine but patient still declined. Advised may receive this vaccine at local pharmacy or Health Dept.or vaccine clinic. Aware to provide a copy of the vaccination record if obtained from local pharmacy or Health Dept. Verbalized acceptance and understanding.  Qualifies for Shingles Vaccine? Yes   Zostavax completed No   Shingrix Completed?: No.    Education has been provided regarding the importance of this vaccine. Patient has been advised to call insurance company to determine out of pocket expense if they have not yet received this vaccine.  Advised may also receive vaccine at local pharmacy or Health Dept. Verbalized acceptance and understanding.  Screening Tests Health Maintenance  Topic Date Due   DTaP/Tdap/Td (1 - Tdap) Never done   Zoster Vaccines- Shingrix (1 of 2) Never done   Pneumonia Vaccine 93+ Years old (1 of 1 - PCV) Never done   COVID-19 Vaccine (1 - 2023-24 season) Never done   INFLUENZA VACCINE  05/20/2023   Medicare Annual Wellness (AWV)  05/18/2024   Hepatitis C Screening  Completed   HPV VACCINES  Aged Out    Health Maintenance  Health Maintenance Due  Topic Date Due   DTaP/Tdap/Td (1 - Tdap) Never done   Zoster Vaccines- Shingrix (1 of 2) Never done   Pneumonia Vaccine 29+ Years old (1 of 1 - PCV) Never done   COVID-19 Vaccine (1 - 2023-24 season) Never done    Colorectal cancer screening: No  longer required.   Lung Cancer Screening: (Low Dose CT Chest recommended if Age 39-80 years, 20 pack-year currently smoking OR have quit w/in 15years.) does not qualify.   Lung Cancer Screening Referral: n/a  Additional Screening:  Hepatitis C Screening: does not qualify; Completed 07/22/2015  Vision Screening: Recommended annual ophthalmology exams for early detection of glaucoma and other disorders of the eye. Is the patient up to date with their annual eye exam?  Yes  Who is the provider or what is the name of the office in which the patient attends annual eye exams? Dr.Lee  If pt is not established with a provider, would they like to be referred to a provider to establish care? No .   Dental Screening: Recommended annual dental exams for proper oral hygiene   Community Resource Referral / Chronic Care Management: CRR required this visit?  No   CCM required this visit?  No     Plan:     I have personally reviewed and noted the following in the patient's chart:   Medical and social history Use of alcohol, tobacco or illicit drugs  Current medications and supplements including opioid  prescriptions. Patient is not currently taking opioid prescriptions. Functional ability and status Nutritional status Physical activity Advanced directives List of other physicians Hospitalizations, surgeries, and ER visits in previous 12 months Vitals Screenings to include cognitive, depression, and falls Referrals and appointments  In addition, I have reviewed and discussed with patient certain preventive protocols, quality metrics, and best practice recommendations. A written personalized care plan for preventive services as well as general preventive health recommendations were provided to patient.     Lorrene Reid, LPN   11/06/1476   After Visit Summary: (MyChart) Due to this being a telephonic visit, the after visit summary with patients personalized plan was offered to patient via MyChart   Nurse Notes: none

## 2023-06-29 ENCOUNTER — Ambulatory Visit: Payer: Medicare PPO | Admitting: Nurse Practitioner

## 2023-06-29 ENCOUNTER — Encounter: Payer: Self-pay | Admitting: Nurse Practitioner

## 2023-06-29 VITALS — BP 124/75 | HR 78 | Temp 98.4°F | Resp 20 | Ht 68.0 in | Wt 186.0 lb

## 2023-06-29 DIAGNOSIS — G459 Transient cerebral ischemic attack, unspecified: Secondary | ICD-10-CM | POA: Diagnosis not present

## 2023-06-29 DIAGNOSIS — Z6828 Body mass index (BMI) 28.0-28.9, adult: Secondary | ICD-10-CM

## 2023-06-29 DIAGNOSIS — K219 Gastro-esophageal reflux disease without esophagitis: Secondary | ICD-10-CM | POA: Diagnosis not present

## 2023-06-29 DIAGNOSIS — E782 Mixed hyperlipidemia: Secondary | ICD-10-CM | POA: Diagnosis not present

## 2023-06-29 DIAGNOSIS — I10 Essential (primary) hypertension: Secondary | ICD-10-CM

## 2023-06-29 MED ORDER — CLOPIDOGREL BISULFATE 75 MG PO TABS
75.0000 mg | ORAL_TABLET | Freq: Every day | ORAL | 1 refills | Status: DC
Start: 2023-06-29 — End: 2023-12-27

## 2023-06-29 MED ORDER — OLMESARTAN MEDOXOMIL-HCTZ 20-12.5 MG PO TABS: 1.0000 | ORAL_TABLET | Freq: Every day | ORAL | 1 refills | Status: DC

## 2023-06-29 NOTE — Progress Notes (Signed)
Subjective:    Patient ID: Timothy Duran, male    DOB: Oct 22, 1945, 77 y.o.   MRN: [id number]   Chief Complaint: medical management of chronic issues    HPI:  Timothy Duran is a 77 y.o. who identifies as a male who was assigned male at birth.   Social history: Lives with: By himself since his wife died 4 months ago  Work history: retired   Water engineer in today for follow up of the following chronic medical issues:  1. Primary hypertension No c/o chest pain, sob or headache. Does not check blood pressure at home. BP Readings from Last 3 Encounters:  12/28/22 112/73  06/29/22 100/65  12/23/21 106/63     2. Mixed hyperlipidemia Has had a poor appetite since his wife died.  Lab Results  Component Value Date   CHOL 205 (H) 12/28/2022   HDL 42 12/28/2022   LDLCALC 131 (H) 12/28/2022   TRIG 181 (H) 12/28/2022   CHOLHDL 4.9 12/28/2022     3. TIA (transient ischemic attack) No permanent effects. No recent episodes that he is aware of.  4. Gastroesophageal reflux disease without esophagitis No recent symptoms  5. BMI 28.0-28.9,adult Weight is up 8lbs Wt Readings from Last 3 Encounters:  06/29/23 186 lb (84.4 kg)  05/19/23 178 lb (80.7 kg)  12/28/22 187 lb (84.8 kg)   BMI Readings from Last 3 Encounters:  06/29/23 28.28 kg/m  05/19/23 27.06 kg/m  12/28/22 28.43 kg/m     New complaints: None today  Allergies  Allergen Reactions   Oxycodone Other (See Comments)    Pt gets very confused and anxious with this medication   Other Other (See Comments)    Nasal packing- headaches and facial pain   Penicillins Other (See Comments)    Blisters  Patient states this was a childhood reaction   Outpatient Encounter Medications as of 06/29/2023  Medication Sig   acetaminophen (TYLENOL) 325 MG tablet Take 2 tablets (650 mg total) by mouth every 6 (six) hours as needed for mild pain (or Fever >/= 101).   Cholecalciferol (VITAMIN D3 PO) Take 1 tablet by mouth daily.    clopidogrel (PLAVIX) 75 MG tablet Take 1 tablet (75 mg total) by mouth daily.   MAGNESIUM PO Take 1 tablet by mouth daily.   meclizine (ANTIVERT) 25 MG tablet Take 1 tablet (25 mg total) by mouth daily as needed (dizziness, vertigo).   Multiple Vitamins-Minerals (ZINC PO) Take 1 tablet by mouth daily.   olmesartan-hydrochlorothiazide (BENICAR HCT) 20-12.5 MG tablet Take 1 tablet by mouth daily.   polyethylene glycol (MIRALAX / GLYCOLAX) 17 g packet Take 17 g by mouth daily as needed for moderate constipation.   No facility-administered encounter medications on file as of 06/29/2023.    Past Surgical History:  Procedure Laterality Date   BASAL CELL CARCINOMA EXCISION Left 2015   "neck"   BLADDER STONE REMOVAL  09/2009   CHOLECYSTECTOMY N/A 05/17/2021   Procedure: LAPAROSCOPIC CHOLECYSTECTOMY;  Surgeon: Fritzi Mandes, MD;  Location: [facility];  Service: General;  Laterality: N/A;   CYSTOSCOPY W/ STONE MANIPULATION  1994   "got one stone; still has one lodge in there" (05/20/2015)   INCISION AND DRAINAGE ABSCESS Left 06/20/2015   Procedure: INCISION AND DRAINAGE ABSCESS;  Surgeon: Venita Lick, MD;  Location: [facility];  Service: Orthopedics;  Laterality: Left;   KIDNEY STONE SURGERY  1995   KNEE ARTHROTOMY Left 07/20/2015   Procedure: KNEE ARTHROTOMY Incision and Drainage left knee;  Surgeon: Durene Romans, MD;  Location: [facility];  Service: Orthopedics;  Laterality: Left;   NASAL ENDOSCOPY WITH EPISTAXIS CONTROL Left 05/21/2015   Procedure: NASAL ENDOSCOPY WITH EPISTAXIS CONTROL;  Surgeon: Suzanna Obey, MD;  Location: [facility];  Service: ENT;  Laterality: Left;   PROSTATECTOMY  01/2010   SHOULDER SURGERY  11/2015   torn ligament   VIDEO ASSISTED THORACOSCOPY (VATS)/WEDGE RESECTION Right 10/2009   "upper; benign"    Family History  Problem Relation Age of Onset   Dementia Mother    Prostate cancer Father        eye, lung, prostate   Prostate cancer Brother    Colon cancer Sister       Controlled  substance contract: n/a     Review of Systems  Constitutional:  Negative for diaphoresis.  Eyes:  Negative for pain.  Respiratory:  Negative for shortness of breath.   Cardiovascular:  Negative for chest pain, palpitations and leg swelling.  Gastrointestinal:  Negative for abdominal pain.  Endocrine: Negative for polydipsia.  Skin:  Negative for rash.  Neurological:  Negative for dizziness, weakness and headaches.  Hematological:  Does not bruise/bleed easily.  All other systems reviewed and are negative.      Objective:   Physical Exam Vitals and nursing note reviewed.  Constitutional:      Appearance: Normal appearance. He is well-developed.  HENT:     Head: Normocephalic.     Nose: Nose normal.     Mouth/Throat:     Mouth: Mucous membranes are moist.     Pharynx: Oropharynx is clear.  Eyes:     Pupils: Pupils are equal, round, and reactive to light.  Neck:     Thyroid: No thyroid mass or thyromegaly.     Vascular: No carotid bruit or JVD.     Trachea: Phonation normal.  Cardiovascular:     Rate and Rhythm: Normal rate and regular rhythm.  Pulmonary:     Effort: Pulmonary effort is normal. No respiratory distress.     Breath sounds: Normal breath sounds.  Abdominal:     General: Bowel sounds are normal.     Palpations: Abdomen is soft.     Tenderness: There is no abdominal tenderness.  Musculoskeletal:        General: Normal range of motion.     Cervical back: Normal range of motion and neck supple.  Lymphadenopathy:     Cervical: No cervical adenopathy.  Skin:    General: Skin is warm and dry.  Neurological:     Mental Status: He is alert and oriented to person, place, and time.  Psychiatric:        Behavior: Behavior normal.        Thought Content: Thought content normal.        Judgment: Judgment normal.    BP 124/75   Pulse 78   Temp 98.4 F (36.9 C) (Temporal)   Resp 20   Ht 5\' 8"  (1.727 m)   Wt 186 lb (84.4 kg)   SpO2 92%   BMI 28.28 kg/m          Assessment & Plan:   Timothy Duran comes in today with chief complaint of Medical Management of Chronic Issues   Diagnosis and orders addressed:  1. Primary hypertension Low sodium diet - CBC with Differential/Platelet - CMP14+EGFR  2. Mixed hyperlipidemia Low fat diet - Lipid panel  3. TIA (transient ischemic attack)  - clopidogrel (PLAVIX) 75 MG tablet; Take 1  tablet (75 mg total) by mouth daily.  Dispense: 90 tablet; Refill: 1  4. Gastroesophageal reflux disease without esophagitis Avoid spicy foods Do not eat 2 hours prior to bedtime   5. BMI 28.0-28.9,adult Discussed diet and exercise for person with BMI >25 Will recheck weight in 3-6 months    Labs pending Health Maintenance reviewed Diet and exercise encouraged  Follow up plan: 6 months   Mary-Margaret Daphine Deutscher, FNP

## 2023-06-30 LAB — CBC WITH DIFFERENTIAL/PLATELET
Basophils Absolute: 0.1 10*3/uL (ref 0.0–0.2)
Basos: 1 %
EOS (ABSOLUTE): 0.2 10*3/uL (ref 0.0–0.4)
Eos: 3 %
Hematocrit: 49.8 % (ref 37.5–51.0)
Hemoglobin: 16.6 g/dL (ref 13.0–17.7)
Immature Grans (Abs): 0 10*3/uL (ref 0.0–0.1)
Immature Granulocytes: 1 %
Lymphocytes Absolute: 2.3 10*3/uL (ref 0.7–3.1)
Lymphs: 27 %
MCH: 32 pg (ref 26.6–33.0)
MCHC: 33.3 g/dL (ref 31.5–35.7)
MCV: 96 fL (ref 79–97)
Monocytes Absolute: 0.9 10*3/uL (ref 0.1–0.9)
Monocytes: 10 %
Neutrophils Absolute: 5 10*3/uL (ref 1.4–7.0)
Neutrophils: 58 %
Platelets: 225 10*3/uL (ref 150–450)
RBC: 5.18 x10E6/uL (ref 4.14–5.80)
RDW: 13.3 % (ref 11.6–15.4)
WBC: 8.5 10*3/uL (ref 3.4–10.8)

## 2023-06-30 LAB — CMP14+EGFR
ALT: 17 IU/L (ref 0–44)
AST: 20 IU/L (ref 0–40)
Albumin: 4.2 g/dL (ref 3.8–4.8)
Alkaline Phosphatase: 83 IU/L (ref 44–121)
BUN/Creatinine Ratio: 20 (ref 10–24)
BUN: 19 mg/dL (ref 8–27)
Bilirubin Total: 0.3 mg/dL (ref 0.0–1.2)
CO2: 24 mmol/L (ref 20–29)
Calcium: 10.4 mg/dL — ABNORMAL HIGH (ref 8.6–10.2)
Chloride: 103 mmol/L (ref 96–106)
Creatinine, Ser: 0.96 mg/dL (ref 0.76–1.27)
Globulin, Total: 2 g/dL (ref 1.5–4.5)
Glucose: 89 mg/dL (ref 70–99)
Potassium: 4.4 mmol/L (ref 3.5–5.2)
Sodium: 142 mmol/L (ref 134–144)
Total Protein: 6.2 g/dL (ref 6.0–8.5)
eGFR: 80 mL/min/{1.73_m2} (ref >59)

## 2023-06-30 LAB — LIPID PANEL
Chol/HDL Ratio: 4.8 ratio (ref 0.0–5.0)
Cholesterol, Total: 189 mg/dL (ref 100–199)
HDL: 39 mg/dL — ABNORMAL LOW (ref >39)
LDL Chol Calc (NIH): 117 mg/dL — ABNORMAL HIGH (ref 0–99)
Triglycerides: 186 mg/dL — ABNORMAL HIGH (ref 0–149)
VLDL Cholesterol Cal: 33 mg/dL (ref 5–40)

## 2023-09-14 ENCOUNTER — Telehealth: Payer: Self-pay | Admitting: Family Medicine

## 2023-09-14 NOTE — Telephone Encounter (Signed)
Copied from CRM [id number]. Topic: Clinical - Medication Question >> Sep 14, 2023  1:41 PM Timothy Duran O wrote: Reason for CRM: patient is calling about his blood thinners and wonderiing how long do patient has to be off medicine before he get his teeth pulled. Call back number [phone]

## 2023-09-14 NOTE — Telephone Encounter (Signed)
Left detailed voicemail on [facility] phone with instructions

## 2023-09-14 NOTE — Telephone Encounter (Signed)
Copied from CRM [id number]. Topic: Clinical - Medical Advice >> Sep 14, 2023  2:30 PM Tiffany H wrote: Reason for CRM: Patient's daughter Leafy Ro called to ask if patient needs to stop Plavix before dental. If so, for how long. Patient advised that he has appointment tomorrow. Please expedite response.  CB: [phone]

## 2023-09-14 NOTE — Telephone Encounter (Signed)
Has to be off of blood thinner for 5 days prior to dental procedure

## 2023-09-23 NOTE — Telephone Encounter (Signed)
Copied from CRM [id number]. Topic: General - Other >> Sep 23, 2023  8:50 AM Maxwell Marion wrote: Reason for CRM: Pt would like a call from Mary-Margaret or her nurse regarding paperwork/form from dentist office

## 2023-09-23 NOTE — Telephone Encounter (Signed)
Left detailed message on patients voicemail that form from dentist has been filled out and sent up front this AM to be faxed over to them

## 2023-12-27 ENCOUNTER — Ambulatory Visit: Payer: Medicare PPO | Admitting: Nurse Practitioner

## 2023-12-27 ENCOUNTER — Encounter: Payer: Self-pay | Admitting: Nurse Practitioner

## 2023-12-27 VITALS — BP 133/77 | HR 65 | Temp 97.9°F | Ht 68.0 in | Wt 189.0 lb

## 2023-12-27 DIAGNOSIS — G459 Transient cerebral ischemic attack, unspecified: Secondary | ICD-10-CM

## 2023-12-27 DIAGNOSIS — Z6828 Body mass index (BMI) 28.0-28.9, adult: Secondary | ICD-10-CM

## 2023-12-27 DIAGNOSIS — Z125 Encounter for screening for malignant neoplasm of prostate: Secondary | ICD-10-CM

## 2023-12-27 DIAGNOSIS — E782 Mixed hyperlipidemia: Secondary | ICD-10-CM

## 2023-12-27 DIAGNOSIS — I10 Essential (primary) hypertension: Secondary | ICD-10-CM

## 2023-12-27 DIAGNOSIS — K219 Gastro-esophageal reflux disease without esophagitis: Secondary | ICD-10-CM | POA: Diagnosis not present

## 2023-12-27 MED ORDER — CLOPIDOGREL BISULFATE 75 MG PO TABS
75.0000 mg | ORAL_TABLET | Freq: Every day | ORAL | 1 refills | Status: DC
Start: 2023-12-27 — End: 2024-06-26

## 2023-12-27 NOTE — Progress Notes (Signed)
 Subjective:    Patient ID: Timothy Duran, male    DOB: 11/08/1945, 78 y.o.   MRN: [id number]   Chief Complaint: medical management of chronic issues    HPI:  Timothy Duran is a 78 y.o. who identifies as a male who was assigned male at birth.   Social history: Lives with: By himself since his wife died last year Work history: retired   Water engineer in today for follow up of the following chronic medical issues:  1. Primary hypertension No c/o chest pain, sob or headache. Does not check blood pressure at home. BP Readings from Last 3 Encounters:  06/29/23 124/75  12/28/22 112/73  06/29/22 100/65     2. Mixed hyperlipidemia Has had a poor appetite since his wife died. He is currently on no statin Lab Results  Component Value Date   CHOL 189 06/29/2023   HDL 39 (L) 06/29/2023   LDLCALC 117 (H) 06/29/2023   TRIG 186 (H) 06/29/2023   CHOLHDL 4.8 06/29/2023     3. TIA (transient ischemic attack) No permanent effects. No recent episodes that he is aware of.  4. Gastroesophageal reflux disease without esophagitis No recent symptoms  5. BMI 28.0-28.9,adult Weight is up 3lbs  Wt Readings from Last 3 Encounters:  12/27/23 189 lb (85.7 kg)  06/29/23 186 lb (84.4 kg)  05/19/23 178 lb (80.7 kg)   BMI Readings from Last 3 Encounters:  12/27/23 28.74 kg/m  06/29/23 28.28 kg/m  05/19/23 27.06 kg/m      New complaints: None today  Allergies  Allergen Reactions   Oxycodone Other (See Comments)    Pt gets very confused and anxious with this medication   Other Other (See Comments)    Nasal packing- headaches and facial pain   Penicillins Other (See Comments)    Blisters  Patient states this was a childhood reaction   Outpatient Encounter Medications as of 12/27/2023  Medication Sig   acetaminophen (TYLENOL) 325 MG tablet Take 2 tablets (650 mg total) by mouth every 6 (six) hours as needed for mild pain (or Fever >/= 101).   Cholecalciferol (VITAMIN D3 PO) Take 1  tablet by mouth daily.   clopidogrel (PLAVIX) 75 MG tablet Take 1 tablet (75 mg total) by mouth daily.   MAGNESIUM PO Take 1 tablet by mouth daily.   meclizine (ANTIVERT) 25 MG tablet Take 1 tablet (25 mg total) by mouth daily as needed (dizziness, vertigo).   Multiple Vitamins-Minerals (ZINC PO) Take 1 tablet by mouth daily.   olmesartan-hydrochlorothiazide (BENICAR HCT) 20-12.5 MG tablet Take 1 tablet by mouth daily.   polyethylene glycol (MIRALAX / GLYCOLAX) 17 g packet Take 17 g by mouth daily as needed for moderate constipation.   No facility-administered encounter medications on file as of 12/27/2023.    Past Surgical History:  Procedure Laterality Date   BASAL CELL CARCINOMA EXCISION Left 2015   "neck"   BLADDER STONE REMOVAL  09/2009   CHOLECYSTECTOMY N/A 05/17/2021   Procedure: LAPAROSCOPIC CHOLECYSTECTOMY;  Surgeon: Fritzi Mandes, MD;  Location: [facility];  Service: General;  Laterality: N/A;   CYSTOSCOPY W/ STONE MANIPULATION  1994   "got one stone; still has one lodge in there" (05/20/2015)   INCISION AND DRAINAGE ABSCESS Left 06/20/2015   Procedure: INCISION AND DRAINAGE ABSCESS;  Surgeon: Venita Lick, MD;  Location: [facility];  Service: Orthopedics;  Laterality: Left;   KIDNEY STONE SURGERY  1995   KNEE ARTHROTOMY Left 07/20/2015   Procedure: KNEE ARTHROTOMY  Incision and Drainage left knee;  Surgeon: Durene Romans, MD;  Location: [facility];  Service: Orthopedics;  Laterality: Left;   NASAL ENDOSCOPY WITH EPISTAXIS CONTROL Left 05/21/2015   Procedure: NASAL ENDOSCOPY WITH EPISTAXIS CONTROL;  Surgeon: Suzanna Obey, MD;  [facility];  Service: ENT;  Laterality: Left;   PROSTATECTOMY  01/2010   SHOULDER SURGERY  11/2015   torn ligament   VIDEO ASSISTED THORACOSCOPY (VATS)/WEDGE RESECTION Right 10/2009   "upper; benign"    Family History  Problem Relation Age of Onset   Dementia Mother    Prostate cancer Father        eye, lung, prostate   Prostate cancer Brother    Colon cancer  Sister       Controlled substance contract: n/a     Review of Systems  Constitutional:  Negative for diaphoresis.  Eyes:  Negative for pain.  Respiratory:  Negative for shortness of breath.   Cardiovascular:  Negative for chest pain, palpitations and leg swelling.  Gastrointestinal:  Negative for abdominal pain.  Endocrine: Negative for polydipsia.  Skin:  Negative for rash.  Neurological:  Negative for dizziness, weakness and headaches.  Hematological:  Does not bruise/bleed easily.  All other systems reviewed and are negative.      Objective:   Physical Exam Vitals and nursing note reviewed.  Constitutional:      Appearance: Normal appearance. He is well-developed.  HENT:     Head: Normocephalic.     Nose: Nose normal.     Mouth/Throat:     Mouth: Mucous membranes are moist.     Pharynx: Oropharynx is clear.  Eyes:     Pupils: Pupils are equal, round, and reactive to light.  Neck:     Thyroid: No thyroid mass or thyromegaly.     Vascular: No carotid bruit or JVD.     Trachea: Phonation normal.  Cardiovascular:     Rate and Rhythm: Normal rate and regular rhythm.  Pulmonary:     Effort: Pulmonary effort is normal. No respiratory distress.     Breath sounds: Normal breath sounds.  Abdominal:     General: Bowel sounds are normal.     Palpations: Abdomen is soft.     Tenderness: There is no abdominal tenderness.     Hernia: A hernia (ventral hernia- soft and nontender) is present.  Musculoskeletal:        General: Normal range of motion.     Cervical back: Normal range of motion and neck supple.  Lymphadenopathy:     Cervical: No cervical adenopathy.  Skin:    General: Skin is warm and dry.  Neurological:     Mental Status: He is alert and oriented to person, place, and time.  Psychiatric:        Behavior: Behavior normal.        Thought Content: Thought content normal.        Judgment: Judgment normal.    BP 133/77   Pulse 65   Temp 97.9 F (36.6 C)  (Temporal)   Ht 5\' 8"  (1.727 m)   Wt 189 lb (85.7 kg)   SpO2 95%   BMI 28.74 kg/m          Assessment & Plan:   Timothy Duran comes in today with chief complaint of medical management of chronic issues    Diagnosis and orders addressed:  1. Primary hypertension Low sodium diet - CBC with Differential/Platelet - CMP14+EGFR  2. Mixed hyperlipidemia Low fat diet -  Lipid panel  3. TIA (transient ischemic attack)  - clopidogrel (PLAVIX) 75 MG tablet; Take 1 tablet (75 mg total) by mouth daily.  Dispense: 90 tablet; Refill: 1  4. Gastroesophageal reflux disease without esophagitis Avoid spicy foods Do not eat 2 hours prior to bedtime   5. BMI 28.0-28.9,adult Discussed diet and exercise for person with BMI >25 Will recheck weight in 3-6 months  Meds ordered this encounter  Medications   clopidogrel (PLAVIX) 75 MG tablet    Sig: Take 1 tablet (75 mg total) by mouth daily.    Dispense:  90 tablet    Refill:  1    Supervising Provider:   Arville Care A [id number]     Labs pending Health Maintenance reviewed Diet and exercise encouraged  Follow up plan: 6 months   Mary-Margaret Daphine Deutscher, FNP

## 2023-12-27 NOTE — Patient Instructions (Signed)

## 2023-12-28 LAB — CBC WITH DIFFERENTIAL/PLATELET
Basophils Absolute: 0.1 10*3/uL (ref 0.0–0.2)
Basos: 1 %
EOS (ABSOLUTE): 0.3 10*3/uL (ref 0.0–0.4)
Eos: 4 %
Hematocrit: 47.9 % (ref 37.5–51.0)
Hemoglobin: 16.2 g/dL (ref 13.0–17.7)
Immature Grans (Abs): 0 10*3/uL (ref 0.0–0.1)
Immature Granulocytes: 0 %
Lymphocytes Absolute: 2.1 10*3/uL (ref 0.7–3.1)
Lymphs: 24 %
MCH: 31.3 pg (ref 26.6–33.0)
MCHC: 33.8 g/dL (ref 31.5–35.7)
MCV: 93 fL (ref 79–97)
Monocytes Absolute: 0.9 10*3/uL (ref 0.1–0.9)
Monocytes: 11 %
Neutrophils Absolute: 5.1 10*3/uL (ref 1.4–7.0)
Neutrophils: 60 %
Platelets: 247 10*3/uL (ref 150–450)
RBC: 5.17 x10E6/uL (ref 4.14–5.80)
RDW: 13 % (ref 11.6–15.4)
WBC: 8.4 10*3/uL (ref 3.4–10.8)

## 2023-12-28 LAB — CMP14+EGFR
ALT: 18 IU/L (ref 0–44)
AST: 18 IU/L (ref 0–40)
Albumin: 4.4 g/dL (ref 3.8–4.8)
Alkaline Phosphatase: 96 IU/L (ref 44–121)
BUN/Creatinine Ratio: 25 — ABNORMAL HIGH (ref 10–24)
BUN: 20 mg/dL (ref 8–27)
Bilirubin Total: 0.3 mg/dL (ref 0.0–1.2)
CO2: 23 mmol/L (ref 20–29)
Calcium: 9.9 mg/dL (ref 8.6–10.2)
Chloride: 104 mmol/L (ref 96–106)
Creatinine, Ser: 0.79 mg/dL (ref 0.76–1.27)
Globulin, Total: 1.8 g/dL (ref 1.5–4.5)
Glucose: 91 mg/dL (ref 70–99)
Potassium: 4.7 mmol/L (ref 3.5–5.2)
Sodium: 139 mmol/L (ref 134–144)
Total Protein: 6.2 g/dL (ref 6.0–8.5)
eGFR: 90 mL/min/{1.73_m2} (ref >59)

## 2023-12-28 LAB — PSA, TOTAL AND FREE
PSA, Free: <0.02 ng/mL
Prostate Specific Ag, Serum: <0.1 ng/mL (ref 0.0–4.0)

## 2023-12-28 LAB — LIPID PANEL
Chol/HDL Ratio: 4.4 ratio (ref 0.0–5.0)
Cholesterol, Total: 184 mg/dL (ref 100–199)
HDL: 42 mg/dL (ref >39)
LDL Chol Calc (NIH): 103 mg/dL — ABNORMAL HIGH (ref 0–99)
Triglycerides: 226 mg/dL — ABNORMAL HIGH (ref 0–149)
VLDL Cholesterol Cal: 39 mg/dL (ref 5–40)

## 2024-01-24 ENCOUNTER — Telehealth: Payer: Self-pay

## 2024-01-24 NOTE — Telephone Encounter (Signed)
 Patient notified and verbalized understanding.

## 2024-01-24 NOTE — Telephone Encounter (Signed)
 Copied from CRM [id number]. Topic: Clinical - Medical Advice >> Jan 24, 2024 12:12 PM Fredrica W wrote: Reason for CRM: Patient called. Takes Plavix. Has appt with dentists on 4/10. Needs to know how long he needs to be off plavix before having a tooth pulled. He thinks its 3 days but he already took it today. Would like a call back. Thank You

## 2024-01-24 NOTE — Telephone Encounter (Signed)
 Hold plavix for 3 days prior

## 2024-05-15 ENCOUNTER — Telehealth: Payer: Self-pay | Admitting: Nurse Practitioner

## 2024-05-15 NOTE — Telephone Encounter (Signed)
 Error

## 2024-05-15 NOTE — Telephone Encounter (Signed)
 Spoke with patient to schedule his AWV.   He asked that someone contact him in reference to how long he has to be off his Plavix  prior to having teeth pulled.  At this time he does not know when this will be schedule  Thank you,  Corean,  AMB Clinical Support [facility] AWV Program Direct Dial ??[phone]

## 2024-05-16 ENCOUNTER — Ambulatory Visit

## 2024-05-16 ENCOUNTER — Telehealth: Payer: Self-pay

## 2024-05-16 VITALS — BP 133/77 | HR 65 | Ht 68.0 in | Wt 189.0 lb

## 2024-05-16 DIAGNOSIS — Z0000 Encounter for general adult medical examination without abnormal findings: Secondary | ICD-10-CM | POA: Diagnosis not present

## 2024-05-16 NOTE — Telephone Encounter (Signed)
 Stop 1 week prio and start back day after getting procedure done

## 2024-05-16 NOTE — Progress Notes (Signed)
 Subjective:   Timothy Duran is a 78 y.o. who presents for a Medicare Wellness preventive visit.  As a reminder, Annual Wellness Visits don't include a physical exam, and some assessments may be limited, especially if this visit is performed virtually. We may recommend an in-person follow-up visit with your provider if needed.  Visit Complete: Virtual I connected with  Timothy Duran on 05/16/24 by a audio enabled telemedicine application and verified that I am speaking with the correct person using two identifiers.  Patient Location: Home  Provider Location: Home Office  I discussed the limitations of evaluation and management by telemedicine. The patient expressed understanding and agreed to proceed.  Vital Signs: Because this visit was a virtual/telehealth visit, some criteria may be missing or patient reported. Any vitals not documented were not able to be obtained and vitals that have been documented are patient reported.  VideoDeclined- This patient declined Librarian, academic. Therefore the visit was completed with audio only.  Persons Participating in Visit: Patient.  AWV Questionnaire: No: Patient Medicare AWV questionnaire was not completed prior to this visit.  Cardiac Risk Factors include: advanced age (>39men, >98 women);dyslipidemia;hypertension     Objective:    Today's Vitals   05/16/24 1129  BP: 133/77  Pulse: 65  Weight: 189 lb (85.7 kg)  Height: 5' 8 (1.727 m)   Body mass index is 28.74 kg/m.     05/16/2024   11:25 AM 05/19/2023    8:24 AM 05/15/2022    8:26 AM 05/15/2021    6:23 PM 05/14/2021    8:20 AM 04/06/2016    8:59 AM 12/10/2015    9:12 AM  Advanced Directives  Does Patient Have a Medical Advance Directive? No No No No No No  No   Would patient like information on creating a medical advance directive?  Yes (MAU/Ambulatory/Procedural Areas - Information given) No - Patient declined No - Patient declined No - Patient  declined Yes - Educational materials given  No - patient declined information      Data saved with a previous flowsheet row definition    Current Medications (verified) Outpatient Encounter Medications as of 05/16/2024  Medication Sig   acetaminophen  (TYLENOL ) 325 MG tablet Take 2 tablets (650 mg total) by mouth every 6 (six) hours as needed for mild pain (or Fever >/= 101).   Cholecalciferol (VITAMIN D3 PO) Take 1 tablet by mouth daily.   clopidogrel  (PLAVIX ) 75 MG tablet Take 1 tablet (75 mg total) by mouth daily.   MAGNESIUM PO Take 1 tablet by mouth daily.   meclizine  (ANTIVERT ) 25 MG tablet Take 1 tablet (25 mg total) by mouth daily as needed (dizziness, vertigo).   Multiple Vitamins-Minerals (ZINC  PO) Take 1 tablet by mouth daily.   olmesartan -hydrochlorothiazide  (BENICAR  HCT) 20-12.5 MG tablet Take 1 tablet by mouth daily.   polyethylene glycol (MIRALAX  / GLYCOLAX ) 17 g packet Take 17 g by mouth daily as needed for moderate constipation.   No facility-administered encounter medications on file as of 05/16/2024.    Allergies (verified) Oxycodone , Other, and Penicillins   History: Past Medical History:  Diagnosis Date   Abdominal hernia    in there now (05/20/2015)   Arthritis    terrible; all over (05/20/2015)   Basal cell carcinoma    left neck   Diverticulosis    Epistaxis 05/20/2015   hospitalized   Hypercholesterolemia    Hypertension    Kidney stone    Prostate cancer (HCC) 2010  prostatectomy   TIA (transient ischemic attack) 2005   ?   Past Surgical History:  Procedure Laterality Date   BASAL CELL CARCINOMA EXCISION Left 2015   neck   BLADDER STONE REMOVAL  09/2009   CHOLECYSTECTOMY N/A 05/17/2021   Procedure: LAPAROSCOPIC CHOLECYSTECTOMY;  Surgeon: Dasie Leonor CROME, MD;  Location: [facility];  Service: General;  Laterality: N/A;   CYSTOSCOPY W/ STONE MANIPULATION  1994   got one stone; still has one lodge in there (05/20/2015)   INCISION AND DRAINAGE  ABSCESS Left 06/20/2015   Procedure: INCISION AND DRAINAGE ABSCESS;  Surgeon: Donaciano Sprang, MD;  Location: [facility];  Service: Orthopedics;  Laterality: Left;   KIDNEY STONE SURGERY  1995   KNEE ARTHROTOMY Left 07/20/2015   Procedure: KNEE ARTHROTOMY Incision and Drainage left knee;  Surgeon: Donnice Car, MD;  Location: [facility];  Service: Orthopedics;  Laterality: Left;   NASAL ENDOSCOPY WITH EPISTAXIS CONTROL Left 05/21/2015   Procedure: NASAL ENDOSCOPY WITH EPISTAXIS CONTROL;  Surgeon: Norleen Notice, MD;  Location: [facility] OR;  Service: ENT;  Laterality: Left;   PROSTATECTOMY  01/2010   SHOULDER SURGERY  11/2015   torn ligament   VIDEO ASSISTED THORACOSCOPY (VATS)/WEDGE RESECTION Right 10/2009   upper; benign   Family History  Problem Relation Age of Onset   Dementia Mother    Prostate cancer Father        eye, lung, prostate   Prostate cancer Brother    Colon cancer Sister    Social History   Socioeconomic History   Marital status: Married    Spouse name: Dagoberto   Number of children: 2   Years of education: 12   Highest education level: Not on file  Occupational History   Occupation: retired    Comment: part time- Production designer, theatre/television/film for [employer]  Tobacco Use   Smoking status: Former    Current packs/day: 0.00    Average packs/day: 1 pack/day for 4.0 years (4.0 ttl pk-yrs)    Types: Cigarettes    Start date: 08/29/2004    Quit date: 08/29/2008    Years since quitting: 15.7   Smokeless tobacco: Never  Vaping Use   Vaping status: Never Used  Substance and Sexual Activity   Alcohol use: No    Alcohol/week: 0.0 standard drinks of alcohol   Drug use: No   Sexual activity: Not on file  Other Topics Concern   Not on file  Social History Narrative   Lives with wife - 2 daughters live nearby   'caffeine use- coffee, 2 cups a day, occas tea   Social Drivers of Health   Financial Resource Strain: Low Risk  (05/16/2024)   Overall Financial Resource Strain (CARDIA)    Difficulty of Paying  Living Expenses: Not hard at all  Food Insecurity: No Food Insecurity (05/16/2024)   Hunger Vital Sign    Worried About Running Out of Food in the Last Year: Never true    Ran Out of Food in the Last Year: Never true  Transportation Needs: No Transportation Needs (05/16/2024)   PRAPARE - Administrator, Civil Service (Medical): No    Lack of Transportation (Non-Medical): No  Physical Activity: Sufficiently Active (05/16/2024)   Exercise Vital Sign    Days of Exercise per Week: 5 days    Minutes of Exercise per Session: 30 min  Stress: No Stress Concern Present (05/16/2024)   [employer] of Occupational Health - Occupational Stress Questionnaire    Feeling of Stress:  Not at all  Social Connections: Moderately Isolated (05/16/2024)   Social Connection and Isolation Panel    Frequency of Communication with Friends and Family: More than three times a week    Frequency of Social Gatherings with Friends and Family: More than three times a week    Attends Religious Services: More than 4 times per year    Active Member of [employer] or Organizations: No    Attends Banker Meetings: Never    Marital Status: Widowed    Tobacco Counseling Counseling given: Yes    Clinical Intake:  Pre-visit preparation completed: Yes  Pain : No/denies pain     Nutritional Risks: None Diabetes: No  No results found for: HGBA1C   How often do you need to have someone help you when you read instructions, pamphlets, or other written materials from your doctor or pharmacy?: 1 - Never  Interpreter Needed?: No  Information entered by :: alia t/cma   Activities of Daily Living     05/16/2024   11:24 AM 05/19/2023    8:24 AM  In your present state of health, do you have any difficulty performing the following activities:  Hearing? 0 0  Vision? 0 0  Difficulty concentrating or making decisions? 1 0  Walking or climbing stairs? 0 0  Dressing or bathing? 0 0  Doing errands,  shopping? 0 0  Preparing Food and eating ? N N  Using the Toilet? N N  In the past six months, have you accidently leaked urine? N N  Do you have problems with loss of bowel control? N N  Managing your Medications? N N  Managing your Finances? N N  Housekeeping or managing your Housekeeping? N N    Patient Care Team: Gladis Mustard, FNP as PCP - General (Nurse Practitioner) Renda Glance, MD as Consulting Physician (Urology) Ladora Ross Lacy Phebe, MD as Referring Physician (Optometry)  I have updated your Care Teams any recent Medical Services you may have received from other providers in the past year.     Assessment:   This is a routine wellness examination for Timothy Duran.  Hearing/Vision screen Hearing Screening - Comments:: Pt denies hearing dif Vision Screening - Comments:: Pt wear glasses/pt goes to [facility] in [place]/last yr   Goals Addressed             This Visit's Progress    Patient Stated   On track    Hopes to stay as healthy and active as he is now       Depression Screen     05/16/2024   11:28 AM 12/27/2023    2:08 PM 06/29/2023    2:21 PM 05/19/2023    8:21 AM 12/28/2022    2:10 PM 06/29/2022    2:13 PM 05/15/2022    8:24 AM  PHQ 2/9 Scores  PHQ - 2 Score 0 0 0 0 0 0 0  PHQ- 9 Score  0 0  0 0     Fall Risk     05/16/2024   11:22 AM 12/27/2023    2:08 PM 06/29/2023    2:21 PM 05/19/2023    8:20 AM 12/28/2022    2:10 PM  Fall Risk   Falls in the past year? 0 0 0 0 0  Number falls in past yr: 0   0   Injury with Fall? 0   0   Risk for fall due to : No Fall Risks   No Fall Risks  Follow up Falls evaluation completed   Falls prevention discussed     MEDICARE RISK AT HOME:  Medicare Risk at Home Any stairs in or around the home?: Yes If so, are there any without handrails?: Yes Home free of loose throw rugs in walkways, pet beds, electrical cords, etc?: Yes Adequate lighting in your home to reduce risk of falls?: Yes Life alert?: No Use of a  cane, walker or w/c?: No Grab bars in the bathroom?: No Shower chair or bench in shower?: Yes Elevated toilet seat or a handicapped toilet?: Yes  TIMED UP AND GO:  Was the test performed?  no  Cognitive Function: 6CIT completed        05/16/2024   11:28 AM 05/19/2023    8:24 AM 05/15/2022    8:27 AM 05/14/2021    8:19 AM  6CIT Screen  What Year? 0 points 0 points 0 points 0 points  What month? 0 points 0 points 0 points 0 points  What time? 0 points 0 points 0 points 0 points  Count back from 20 0 points 0 points 0 points 0 points  Months in reverse 0 points 0 points 0 points 2 points  Repeat phrase 0 points 0 points 0 points 2 points  Total Score 0 points 0 points 0 points 4 points    Immunizations Immunization History  Administered Date(s) Administered   Influenza,inj,Quad PF,6+ Mos 07/11/2015    Screening Tests Health Maintenance  Topic Date Due   Zoster Vaccines- Shingrix (1 of 2) Never done   COVID-19 Vaccine (1 - 2024-25 season) Never done   Pneumococcal Vaccine: 50+ Years (1 of 1 - PCV) 06/28/2024 (Originally 01/24/1994)   DTaP/Tdap/Td (1 - Tdap) 12/26/2024 (Originally 01/25/1965)   INFLUENZA VACCINE  05/19/2024   Medicare Annual Wellness (AWV)  05/16/2025   Hepatitis B Vaccines  Aged Out   HPV VACCINES  Aged Out   Meningococcal B Vaccine  Aged Out   Hepatitis C Screening  Discontinued    Health Maintenance  Health Maintenance Due  Topic Date Due   Zoster Vaccines- Shingrix (1 of 2) Never done   COVID-19 Vaccine (1 - 2024-25 season) Never done   Health Maintenance Items Addressed: See Nurse Notes at the end of this note  Additional Screening:  Vision Screening: Recommended annual ophthalmology exams for early detection of glaucoma and other disorders of the eye. Would you like a referral to an eye doctor? No    Dental Screening: Recommended annual dental exams for proper oral hygiene  Community Resource Referral / Chronic Care Management: CRR required  this visit?  No   CCM required this visit?  No   Plan:    I have personally reviewed and noted the following in the patient's chart:   Medical and social history Use of alcohol, tobacco or illicit drugs  Current medications and supplements including opioid prescriptions. Patient is not currently taking opioid prescriptions. Functional ability and status Nutritional status Physical activity Advanced directives List of other physicians Hospitalizations, surgeries, and ER visits in previous 12 months Vitals Screenings to include cognitive, depression, and falls Referrals and appointments  In addition, I have reviewed and discussed with patient certain preventive protocols, quality metrics, and best practice recommendations. A written personalized care plan for preventive services as well as general preventive health recommendations were provided to patient.   Timothy Duran, CMA   05/16/2024   After Visit Summary: (MyChart) Due to this being a telephonic visit, the after visit summary  with patients personalized plan was offered to patient via MyChart   Notes: Pt is aware and due for shingles vaccine--per pt declined.

## 2024-05-16 NOTE — Telephone Encounter (Signed)
 During today's awv pt stated that he is planning to get this teeth pulled, would like to know when he should stop taking is plavix ? Pls call pt   Thank you.

## 2024-05-16 NOTE — Telephone Encounter (Signed)
 Pt made aware and understood

## 2024-05-16 NOTE — Patient Instructions (Signed)
 Mr. Timothy Duran , Thank you for taking time out of your busy schedule to complete your Annual Wellness Visit with me. I enjoyed our conversation and look forward to speaking with you again next year. I, as well as your care team,  appreciate your ongoing commitment to your health goals. Please review the following plan we discussed and let me know if I can assist you in the future. Your Game plan/ To Do List    Follow up Visits: Next Medicare AWV with our clinical staff: 05/17/25 at 11:20a.m.    Next Office Visit with your provider: 06/26/24 at at 10:15a.m.  Clinician Recommendations:  Aim for 30 minutes of exercise or brisk walking, 6-8 glasses of water, and 5 servings of fruits and vegetables each day.       This is a list of the screening recommended for you and due dates:  Health Maintenance  Topic Date Due   Zoster (Shingles) Vaccine (1 of 2) Never done   COVID-19 Vaccine (1 - 2024-25 season) Never done   Medicare Annual Wellness Visit  05/18/2024   Pneumococcal Vaccine for age over 70 (1 of 1 - PCV) 06/28/2024*   DTaP/Tdap/Td vaccine (1 - Tdap) 12/26/2024*   Flu Shot  05/19/2024   Hepatitis B Vaccine  Aged Out   HPV Vaccine  Aged Out   Meningitis B Vaccine  Aged Out   Hepatitis C Screening  Discontinued  *Topic was postponed. The date shown is not the original due date.    Advanced directives: (Declined) Advance directive discussed with you today. Even though you declined this today, please call our office should you change your mind, and we can give you the proper paperwork for you to fill out. Advance Care Planning is important because it:  [x]  Makes sure you receive the medical care that is consistent with your values, goals, and preferences  [x]  It provides guidance to your family and loved ones and reduces their decisional burden about whether or not they are making the right decisions based on your wishes.  Follow the link provided in your after visit summary or read over the  paperwork we have mailed to you to help you started getting your Advance Directives in place. If you need assistance in completing these, please reach out to us  so that we can help you!  See attachments for Preventive Care and Fall Prevention Tips.

## 2024-06-18 ENCOUNTER — Other Ambulatory Visit: Payer: Self-pay | Admitting: Nurse Practitioner

## 2024-06-26 ENCOUNTER — Encounter: Payer: Self-pay | Admitting: Nurse Practitioner

## 2024-06-26 ENCOUNTER — Ambulatory Visit: Admitting: Nurse Practitioner

## 2024-06-26 VITALS — BP 120/78 | HR 65 | Temp 97.8°F | Ht 68.0 in | Wt 190.0 lb

## 2024-06-26 DIAGNOSIS — Z0001 Encounter for general adult medical examination with abnormal findings: Secondary | ICD-10-CM | POA: Diagnosis not present

## 2024-06-26 DIAGNOSIS — E782 Mixed hyperlipidemia: Secondary | ICD-10-CM | POA: Diagnosis not present

## 2024-06-26 DIAGNOSIS — G459 Transient cerebral ischemic attack, unspecified: Secondary | ICD-10-CM

## 2024-06-26 DIAGNOSIS — I10 Essential (primary) hypertension: Secondary | ICD-10-CM | POA: Diagnosis not present

## 2024-06-26 DIAGNOSIS — Z0000 Encounter for general adult medical examination without abnormal findings: Secondary | ICD-10-CM

## 2024-06-26 DIAGNOSIS — Z6828 Body mass index (BMI) 28.0-28.9, adult: Secondary | ICD-10-CM

## 2024-06-26 DIAGNOSIS — K219 Gastro-esophageal reflux disease without esophagitis: Secondary | ICD-10-CM

## 2024-06-26 MED ORDER — OLMESARTAN MEDOXOMIL-HCTZ 20-12.5 MG PO TABS: 1.0000 | ORAL_TABLET | Freq: Every day | ORAL | 1 refills | Status: AC

## 2024-06-26 MED ORDER — CLOPIDOGREL BISULFATE 75 MG PO TABS: 75.0000 mg | ORAL_TABLET | Freq: Every day | ORAL | 1 refills | Status: DC

## 2024-06-26 NOTE — Progress Notes (Signed)
 Subjective:    Patient ID: Timothy Duran, male    DOB: 01-21-1946, 78 y.o.   MRN: [id number]   Chief Complaint: annual physical    HPI:  Timothy Duran is a 78 y.o. who identifies as a male who was assigned male at birth.   Social history: Lives with: By himself since his wife died last year Work history: retired   Water engineer in today for follow up of the following chronic medical issues:  1. Primary hypertension No c/o chest pain, sob or headache. Does not check blood pressure at home. BP Readings from Last 3 Encounters:  05/16/24 133/77  12/27/23 133/77  06/29/23 124/75     2. Mixed hyperlipidemia Has had a poor appetite since his wife died. He is currently on no statin Lab Results  Component Value Date   CHOL 184 12/27/2023   HDL 42 12/27/2023   LDLCALC 103 (H) 12/27/2023   TRIG 226 (H) 12/27/2023   CHOLHDL 4.4 12/27/2023     3. TIA (transient ischemic attack) No permanent effects. No recent episodes that he is aware of.  4. Gastroesophageal reflux disease without esophagitis No recent symptoms  5. BMI 28.0-28.9,adult Weight is unchanged  Wt Readings from Last 3 Encounters:  06/26/24 190 lb (86.2 kg)  05/16/24 189 lb (85.7 kg)  12/27/23 189 lb (85.7 kg)   BMI Readings from Last 3 Encounters:  06/26/24 28.89 kg/m  05/16/24 28.74 kg/m  12/27/23 28.74 kg/m       New complaints: None today  Allergies  Allergen Reactions   Oxycodone  Other (See Comments)    Pt gets very confused and anxious with this medication   Other Other (See Comments)    Nasal packing- headaches and facial pain   Penicillins Other (See Comments)    Blisters  Patient states this was a childhood reaction   Outpatient Encounter Medications as of 06/26/2024  Medication Sig   acetaminophen  (TYLENOL ) 325 MG tablet Take 2 tablets (650 mg total) by mouth every 6 (six) hours as needed for mild pain (or Fever >/= 101).   Cholecalciferol (VITAMIN D3 PO) Take 1 tablet by mouth daily.    clopidogrel  (PLAVIX ) 75 MG tablet Take 1 tablet (75 mg total) by mouth daily.   MAGNESIUM PO Take 1 tablet by mouth daily.   meclizine  (ANTIVERT ) 25 MG tablet Take 1 tablet (25 mg total) by mouth daily as needed (dizziness, vertigo).   Multiple Vitamins-Minerals (ZINC  PO) Take 1 tablet by mouth daily.   olmesartan -hydrochlorothiazide  (BENICAR  HCT) 20-12.5 MG tablet Take 1 tablet by mouth once daily   polyethylene glycol (MIRALAX  / GLYCOLAX ) 17 g packet Take 17 g by mouth daily as needed for moderate constipation.   No facility-administered encounter medications on file as of 06/26/2024.    Past Surgical History:  Procedure Laterality Date   BASAL CELL CARCINOMA EXCISION Left 2015   neck   BLADDER STONE REMOVAL  09/2009   CHOLECYSTECTOMY N/A 05/17/2021   Procedure: LAPAROSCOPIC CHOLECYSTECTOMY;  Surgeon: Dasie Leonor CROME, MD;  Location: [facility];  Service: General;  Laterality: N/A;   CYSTOSCOPY W/ STONE MANIPULATION  1994   got one stone; still has one lodge in there (05/20/2015)   INCISION AND DRAINAGE ABSCESS Left 06/20/2015   Procedure: INCISION AND DRAINAGE ABSCESS;  Surgeon: Donaciano Sprang, MD;  Location: [facility];  Service: Orthopedics;  Laterality: Left;   KIDNEY STONE SURGERY  1995   KNEE ARTHROTOMY Left 07/20/2015   Procedure: KNEE ARTHROTOMY Incision and Drainage  left knee;  Surgeon: Donnice Car, MD;  Location: [facility];  Service: Orthopedics;  Laterality: Left;   NASAL ENDOSCOPY WITH EPISTAXIS CONTROL Left 05/21/2015   Procedure: NASAL ENDOSCOPY WITH EPISTAXIS CONTROL;  Surgeon: Norleen Notice, MD;  Location: [facility];  Service: ENT;  Laterality: Left;   PROSTATECTOMY  01/2010   SHOULDER SURGERY  11/2015   torn ligament   VIDEO ASSISTED THORACOSCOPY (VATS)/WEDGE RESECTION Right 10/2009   upper; benign    Family History  Problem Relation Age of Onset   Dementia Mother    Prostate cancer Father        eye, lung, prostate   Prostate cancer Brother    Colon cancer Sister        Controlled substance contract: n/a     Review of Systems  Constitutional:  Negative for diaphoresis.  Eyes:  Negative for pain.  Respiratory:  Negative for shortness of breath.   Cardiovascular:  Negative for chest pain, palpitations and leg swelling.  Gastrointestinal:  Negative for abdominal pain.  Endocrine: Negative for polydipsia.  Skin:  Negative for rash.  Neurological:  Negative for dizziness, weakness and headaches.  Hematological:  Does not bruise/bleed easily.  All other systems reviewed and are negative.      Objective:   Physical Exam Vitals and nursing note reviewed.  Constitutional:      Appearance: Normal appearance. He is well-developed.  HENT:     Head: Normocephalic.     Nose: Nose normal.     Mouth/Throat:     Mouth: Mucous membranes are moist.     Pharynx: Oropharynx is clear.  Eyes:     Pupils: Pupils are equal, round, and reactive to light.  Neck:     Thyroid : No thyroid  mass or thyromegaly.     Vascular: No carotid bruit or JVD.     Trachea: Phonation normal.  Cardiovascular:     Rate and Rhythm: Normal rate and regular rhythm.  Pulmonary:     Effort: Pulmonary effort is normal. No respiratory distress.     Breath sounds: Normal breath sounds.  Abdominal:     General: Bowel sounds are normal.     Palpations: Abdomen is soft.     Tenderness: There is no abdominal tenderness.     Hernia: A hernia (ventral hernia- soft and nontender) is present.  Musculoskeletal:        General: Normal range of motion.     Cervical back: Normal range of motion and neck supple.  Lymphadenopathy:     Cervical: No cervical adenopathy.  Skin:    General: Skin is warm and dry.  Neurological:     Mental Status: He is alert and oriented to person, place, and time.  Psychiatric:        Behavior: Behavior normal.        Thought Content: Thought content normal.        Judgment: Judgment normal.    BP 120/78   Pulse 65   Temp 97.8 F (36.6 C)  (Temporal)   Ht 5' 8 (1.727 m)   Wt 190 lb (86.2 kg)   SpO2 95%   BMI 28.89 kg/m        Assessment & Plan:   [name] comes in today with chief complaint of annual physical   Diagnosis and orders addressed:  1. Primary hypertension Low sodium diet - CBC with Differential/Platelet - CMP14+EGFR  2. Mixed hyperlipidemia Low fat diet - Lipid panel  3. TIA (transient ischemic attack)  -  clopidogrel  (PLAVIX ) 75 MG tablet; Take 1 tablet (75 mg total) by mouth daily.  Dispense: 90 tablet; Refill: 1  4. Gastroesophageal reflux disease without esophagitis Avoid spicy foods Do not eat 2 hours prior to bedtime   5. BMI 28.0-28.9,adult Discussed diet and exercise for person with BMI >25 Will recheck weight in 3-6 months     Labs pending Health Maintenance reviewed Diet and exercise encouraged  Follow up plan: 6 months   Mary-Margaret Gladis, FNP

## 2024-06-26 NOTE — Patient Instructions (Signed)
 Fall Prevention in the Home, Adult Falls can cause injuries and can happen to people of all ages. There are many things you can do to make your home safer and to help prevent falls. What actions can I take to prevent falls? General information Use good lighting in all rooms. Make sure to: Replace any light bulbs that burn out. Turn on the lights in dark areas and use night-lights. Keep items that you use often in easy-to-reach places. Lower the shelves around your home if needed. Move furniture so that there are clear paths around it. Do not use throw rugs or other things on the floor that can make you trip. If any of your floors are uneven, fix them. Add color or contrast paint or tape to clearly mark and help you see: Grab bars or handrails. First and last steps of staircases. Where the edge of each step is. If you use a ladder or stepladder: Make sure that it is fully opened. Do not climb a closed ladder. Make sure the sides of the ladder are locked in place. Have someone hold the ladder while you use it. Know where your pets are as you move through your home. What can I do in the bathroom?     Keep the floor dry. Clean up any water on the floor right away. Remove soap buildup in the bathtub or shower. Buildup makes bathtubs and showers slippery. Use non-skid mats or decals on the floor of the bathtub or shower. Attach bath mats securely with double-sided, non-slip rug tape. If you need to sit down in the shower, use a non-slip stool. Install grab bars by the toilet and in the bathtub and shower. Do not use towel bars as grab bars. What can I do in the bedroom? Make sure that you have a light by your bed that is easy to reach. Do not use any sheets or blankets on your bed that hang to the floor. Have a firm chair or bench with side arms that you can use for support when you get dressed. What can I do in the kitchen? Clean up any spills right away. If you need to reach something  above you, use a step stool with a grab bar. Keep electrical cords out of the way. Do not use floor polish or wax that makes floors slippery. What can I do with my stairs? Do not leave anything on the stairs. Make sure that you have a light switch at the top and the bottom of the stairs. Make sure that there are handrails on both sides of the stairs. Fix handrails that are broken or loose. Install non-slip stair treads on all your stairs if they do not have carpet. Avoid having throw rugs at the top or bottom of the stairs. Choose a carpet that does not hide the edge of the steps on the stairs. Make sure that the carpet is firmly attached to the stairs. Fix carpet that is loose or worn. What can I do on the outside of my home? Use bright outdoor lighting. Fix the edges of walkways and driveways and fix any cracks. Clear paths of anything that can make you trip, such as tools or rocks. Add color or contrast paint or tape to clearly mark and help you see anything that might make you trip as you walk through a door, such as a raised step or threshold. Trim any bushes or trees on paths to your home. Check to see if handrails are loose  or broken and that both sides of all steps have handrails. Install guardrails along the edges of any raised decks and porches. Have leaves, snow, or ice cleared regularly. Use sand, salt, or ice melter on paths if you live where there is ice and snow during the winter. Clean up any spills in your garage right away. This includes grease or oil spills. What other actions can I take? Review your medicines with your doctor. Some medicines can cause dizziness or changes in blood pressure, which increase your risk of falling. Wear shoes that: Have a low heel. Do not wear high heels. Have rubber bottoms and are closed at the toe. Feel good on your feet and fit well. Use tools that help you move around if needed. These include: Canes. Walkers. Scooters. Crutches. Ask  your doctor what else you can do to help prevent falls. This may include seeing a physical therapist to learn to do exercises to move better and get stronger. Where to find more information Centers for Disease Control and Prevention, STEADI: TonerPromos.no [employer] on Aging: BaseRingTones.pl National Institute on Aging: BaseRingTones.pl Contact a doctor if: You are afraid of falling at home. You feel weak, drowsy, or dizzy at home. You fall at home. Get help right away if you: Lose consciousness or have trouble moving after a fall. Have a fall that causes a head injury. These symptoms may be an emergency. Get help right away. Call 911. Do not wait to see if the symptoms will go away. Do not drive yourself to the hospital. This information is not intended to replace advice given to you by your health care provider. Make sure you discuss any questions you have with your health care provider. Document Revised: 06/08/2022 Document Reviewed: 06/08/2022 Elsevier Patient Education  2024 [employer].

## 2024-06-27 ENCOUNTER — Ambulatory Visit: Payer: Self-pay | Admitting: Nurse Practitioner

## 2024-06-27 LAB — CMP14+EGFR
ALT: 15 IU/L (ref 0–44)
AST: 15 IU/L (ref 0–40)
Albumin: 4.2 g/dL (ref 3.8–4.8)
Alkaline Phosphatase: 87 IU/L (ref 44–121)
BUN/Creatinine Ratio: 24 (ref 10–24)
BUN: 19 mg/dL (ref 8–27)
Bilirubin Total: 0.2 mg/dL (ref 0.0–1.2)
CO2: 22 mmol/L (ref 20–29)
Calcium: 10.7 mg/dL — ABNORMAL HIGH (ref 8.6–10.2)
Chloride: 105 mmol/L (ref 96–106)
Creatinine, Ser: 0.78 mg/dL (ref 0.76–1.27)
Globulin, Total: 2 g/dL (ref 1.5–4.5)
Glucose: 98 mg/dL (ref 70–99)
Potassium: 5.2 mmol/L (ref 3.5–5.2)
Sodium: 142 mmol/L (ref 134–144)
Total Protein: 6.2 g/dL (ref 6.0–8.5)
eGFR: 90 mL/min/1.73 (ref >59)

## 2024-06-27 LAB — THYROID PANEL WITH TSH
Free Thyroxine Index: 1.4 (ref 1.2–4.9)
T3 Uptake Ratio: 26 % (ref 24–39)
T4, Total: 5.3 ug/dL (ref 4.5–12.0)
TSH: 2.02 u[IU]/mL (ref 0.450–4.500)

## 2024-06-27 LAB — LIPID PANEL
Chol/HDL Ratio: 4 ratio (ref 0.0–5.0)
Cholesterol, Total: 183 mg/dL (ref 100–199)
HDL: 46 mg/dL (ref >39)
LDL Chol Calc (NIH): 122 mg/dL — ABNORMAL HIGH (ref 0–99)
Triglycerides: 78 mg/dL (ref 0–149)
VLDL Cholesterol Cal: 15 mg/dL (ref 5–40)

## 2024-06-27 LAB — CBC WITH DIFFERENTIAL/PLATELET

## 2024-06-27 LAB — VITAMIN D 25 HYDROXY (VIT D DEFICIENCY, FRACTURES): Vit D, 25-Hydroxy: 42.9 ng/mL (ref 30.0–100.0)

## 2024-11-04 ENCOUNTER — Inpatient Hospital Stay (HOSPITAL_COMMUNITY)
Admission: EM | Admit: 2024-11-04 | Discharge: 2024-11-06 | DRG: 175 | Disposition: A | Discharge status: Home / Self Care | Attending: Internal Medicine | Admitting: Internal Medicine

## 2024-11-04 ENCOUNTER — Emergency Department (HOSPITAL_COMMUNITY)

## 2024-11-04 DIAGNOSIS — Z800 Family history of malignant neoplasm of digestive organs: Secondary | ICD-10-CM

## 2024-11-04 DIAGNOSIS — Z79899 Other long term (current) drug therapy: Secondary | ICD-10-CM

## 2024-11-04 DIAGNOSIS — I10 Essential (primary) hypertension: Secondary | ICD-10-CM | POA: Diagnosis present

## 2024-11-04 DIAGNOSIS — N200 Calculus of kidney: Secondary | ICD-10-CM | POA: Diagnosis present

## 2024-11-04 DIAGNOSIS — I77810 Thoracic aortic ectasia: Secondary | ICD-10-CM | POA: Diagnosis present

## 2024-11-04 DIAGNOSIS — Z8042 Family history of malignant neoplasm of prostate: Secondary | ICD-10-CM

## 2024-11-04 DIAGNOSIS — Z85828 Personal history of other malignant neoplasm of skin: Secondary | ICD-10-CM

## 2024-11-04 DIAGNOSIS — Z91010 Allergy to peanuts: Secondary | ICD-10-CM

## 2024-11-04 DIAGNOSIS — Z8546 Personal history of malignant neoplasm of prostate: Secondary | ICD-10-CM

## 2024-11-04 DIAGNOSIS — Z9049 Acquired absence of other specified parts of digestive tract: Secondary | ICD-10-CM

## 2024-11-04 DIAGNOSIS — I2699 Other pulmonary embolism without acute cor pulmonale: Principal | ICD-10-CM | POA: Diagnosis present

## 2024-11-04 DIAGNOSIS — Z885 Allergy status to narcotic agent status: Secondary | ICD-10-CM

## 2024-11-04 DIAGNOSIS — Z7901 Long term (current) use of anticoagulants: Secondary | ICD-10-CM

## 2024-11-04 DIAGNOSIS — E7800 Pure hypercholesterolemia, unspecified: Secondary | ICD-10-CM | POA: Diagnosis present

## 2024-11-04 DIAGNOSIS — Z8673 Personal history of transient ischemic attack (TIA), and cerebral infarction without residual deficits: Secondary | ICD-10-CM

## 2024-11-04 DIAGNOSIS — Z7902 Long term (current) use of antithrombotics/antiplatelets: Secondary | ICD-10-CM

## 2024-11-04 DIAGNOSIS — K219 Gastro-esophageal reflux disease without esophagitis: Secondary | ICD-10-CM | POA: Diagnosis present

## 2024-11-04 DIAGNOSIS — J189 Pneumonia, unspecified organism: Secondary | ICD-10-CM | POA: Diagnosis present

## 2024-11-04 DIAGNOSIS — Z87891 Personal history of nicotine dependence: Secondary | ICD-10-CM

## 2024-11-04 LAB — COMPREHENSIVE METABOLIC PANEL WITH GFR
ALT: 16 U/L (ref 0–44)
AST: 17 U/L (ref 15–41)
Albumin: 4.1 g/dL (ref 3.5–5.0)
Alkaline Phosphatase: 95 U/L (ref 38–126)
Anion gap: 12 (ref 5–15)
BUN: 17 mg/dL (ref 8–23)
CO2: 20 mmol/L — ABNORMAL LOW (ref 22–32)
Calcium: 9.8 mg/dL (ref 8.9–10.3)
Chloride: 102 mmol/L (ref 98–111)
Creatinine, Ser: 0.75 mg/dL (ref 0.61–1.24)
GFR, Estimated: >60 mL/min
Glucose, Bld: 125 mg/dL — ABNORMAL HIGH (ref 70–99)
Potassium: 4.2 mmol/L (ref 3.5–5.1)
Sodium: 134 mmol/L — ABNORMAL LOW (ref 135–145)
Total Bilirubin: 0.6 mg/dL (ref 0.0–1.2)
Total Protein: 7.1 g/dL (ref 6.5–8.1)

## 2024-11-04 LAB — CBC WITH DIFFERENTIAL/PLATELET
Abs Immature Granulocytes: 0.06 K/uL (ref 0.00–0.07)
Basophils Absolute: 0.1 K/uL (ref 0.0–0.1)
Basophils Relative: 0 %
Eosinophils Absolute: 0 K/uL (ref 0.0–0.5)
Eosinophils Relative: 0 %
HCT: 48.5 % (ref 39.0–52.0)
Hemoglobin: 16.3 g/dL (ref 13.0–17.0)
Immature Granulocytes: 0 %
Lymphocytes Relative: 11 %
Lymphs Abs: 1.9 K/uL (ref 0.7–4.0)
MCH: 31.3 pg (ref 26.0–34.0)
MCHC: 33.6 g/dL (ref 30.0–36.0)
MCV: 93.3 fL (ref 80.0–100.0)
Monocytes Absolute: 1.6 K/uL — ABNORMAL HIGH (ref 0.1–1.0)
Monocytes Relative: 10 %
Neutro Abs: 13 K/uL — ABNORMAL HIGH (ref 1.7–7.7)
Neutrophils Relative %: 79 %
Platelets: 224 K/uL (ref 150–400)
RBC: 5.2 MIL/uL (ref 4.22–5.81)
RDW: 13.5 % (ref 11.5–15.5)
WBC: 16.6 K/uL — ABNORMAL HIGH (ref 4.0–10.5)
nRBC: 0 % (ref 0.0–0.2)

## 2024-11-04 LAB — LIPASE, BLOOD: Lipase: 23 U/L (ref 11–51)

## 2024-11-04 LAB — TROPONIN T, HIGH SENSITIVITY: Troponin T High Sensitivity: <15 ng/L (ref 0–19)

## 2024-11-04 LAB — I-STAT CG4 LACTIC ACID, ED: Lactic Acid, Venous: 1.8 mmol/L (ref 0.5–1.9)

## 2024-11-04 MED ORDER — APIXABAN 5 MG PO TABS
10.0000 mg | ORAL_TABLET | Freq: Two times a day (BID) | ORAL | Status: DC
  Administered 2024-11-05: 10 mg via ORAL
  Filled 2024-11-04: qty 2

## 2024-11-04 MED ORDER — ONDANSETRON HCL 4 MG/2ML IJ SOLN
4.0000 mg | Freq: Once | INTRAMUSCULAR | Status: AC
  Administered 2024-11-04: 4 mg via INTRAVENOUS
  Filled 2024-11-04: qty 2

## 2024-11-04 MED ORDER — VANCOMYCIN HCL IN DEXTROSE 1-5 GM/200ML-% IV SOLN: 1000.0000 mg | Freq: Once | INTRAVENOUS | Status: DC

## 2024-11-04 MED ORDER — VANCOMYCIN HCL 1500 MG/300ML IV SOLN
1500.0000 mg | Freq: Once | INTRAVENOUS | Status: AC
  Administered 2024-11-05: 1500 mg via INTRAVENOUS
  Filled 2024-11-04: qty 300

## 2024-11-04 MED ORDER — SODIUM CHLORIDE 0.9 % IV BOLUS
500.0000 mL | Freq: Once | INTRAVENOUS | Status: AC
  Administered 2024-11-04: 500 mL via INTRAVENOUS

## 2024-11-04 MED ORDER — SODIUM CHLORIDE 0.9 % IV SOLN
2.0000 g | Freq: Once | INTRAVENOUS | Status: AC
  Administered 2024-11-05: 2 g via INTRAVENOUS
  Filled 2024-11-04: qty 12.5

## 2024-11-04 MED ORDER — HYDROMORPHONE HCL 1 MG/ML IJ SOLN
0.5000 mg | Freq: Once | INTRAMUSCULAR | Status: AC
  Administered 2024-11-04: 0.5 mg via INTRAVENOUS
  Filled 2024-11-04: qty 1

## 2024-11-04 MED ORDER — MORPHINE SULFATE (PF) 2 MG/ML IV SOLN
2.0000 mg | Freq: Once | INTRAVENOUS | Status: AC
  Administered 2024-11-04: 2 mg via INTRAVENOUS
  Filled 2024-11-04: qty 1

## 2024-11-04 MED ORDER — IOHEXOL 350 MG/ML SOLN
100.0000 mL | Freq: Once | INTRAVENOUS | Status: AC | PRN
  Administered 2024-11-04: 100 mL via INTRAVENOUS

## 2024-11-04 MED ORDER — APIXABAN 5 MG PO TABS: 5.0000 mg | ORAL_TABLET | Freq: Two times a day (BID) | ORAL | Status: DC

## 2024-11-04 NOTE — ED Provider Notes (Signed)
 " [place] [facility] AT [facility] Provider Note   CSN: [id number] Arrival date & time: 11/04/24  1824     Patient presents with: Abdominal Pain, Flank Pain, and Shoulder Pain   Timothy Duran is a 79 y.o. male.  With history of hypertension, TIA, hyperlipidemia, prior kidney stone, status post cholecystectomy who presents with right side pain. Started 0300 and does not feel like a stone. Also hurts in right shoulder. Cholecystectomy 6 years ago. Felt well yesterday and worked all day. Had crackers and banana 130pm and nothing after that. Woke up with pain but drank coffee and fed horses and pain got better. Also ate big breakfast which did not affect pain. Worse with cough or deep breath. No f/c. No significant cough and no mucus. No SOB. No urinary sxs. No n/v/d.     Prior to Admission medications  Medication Sig Start Date End Date Taking? Authorizing Provider  acetaminophen  (TYLENOL ) 325 MG tablet Take 2 tablets (650 mg total) by mouth every 6 (six) hours as needed for mild pain (or Fever >/= 101). 05/19/21  Yes Arrien, Mauricio Daniel, MD  Cholecalciferol (VITAMIN D3 PO) Take 1 tablet by mouth daily.   Yes [provider]  clopidogrel  (PLAVIX ) 75 MG tablet Take 1 tablet (75 mg total) by mouth daily. 06/26/24  Yes Martin, Mary-Margaret, FNP  MAGNESIUM PO Take 1 tablet by mouth daily.   Yes [provider]  meclizine  (ANTIVERT ) 25 MG tablet Take 1 tablet (25 mg total) by mouth daily as needed (dizziness, vertigo). 12/23/21  Yes Martin, Mary-Margaret, FNP  Multiple Vitamins-Minerals (ZINC  PO) Take 1 tablet by mouth daily.   Yes [provider]  olmesartan -hydrochlorothiazide  (BENICAR  HCT) 20-12.5 MG tablet Take 1 tablet by mouth daily. 06/26/24  Yes Gladis, Mary-Margaret, FNP    Allergies: Oxycodone , Other, and Penicillins    Review of Systems  Gastrointestinal:  Positive for abdominal pain.  Genitourinary:  Positive for flank pain.     Updated Vital Signs BP (!) 142/71 (BP Location: Right Arm)   Pulse 86   Temp 98 F (36.7 C) (Oral)   Resp 16   Wt 78 kg   SpO2 95%   BMI 26.15 kg/m   Physical Exam  Constitutional: Patient appears well-developed and well-nourished. No distress.  HENT:  Head: Normocephalic and atraumatic.  Mouth/Throat: Oropharynx is clear and moist. No oropharyngeal exudate.  Eyes: Conjunctivae are normal. Pupils are equal, round, and reactive to light. Neck: Normal range of motion. Neck supple.  Cardiovascular: Normal rate, regular rhythm, normal heart sounds and intact distal pulses.   Pulmonary/Chest: Effort normal and breath sounds normal. No respiratory distress. No wheezes or rales.  Abdominal: Soft. Bowel sounds are normal.  Right upper quadrant tenderness with guarding, no rebound.  Musculoskeletal: Normal range of motion. No edema or tenderness.  Neurological: Patient is alert and oriented.  No facial droop.  Clear speech.  Normal strength and sensation throughout.  Normal coordination. Skin: Skin is warm and dry. No diaphoresis.  Psychiatric: Normal mood and affect. Normal behavior. Judgment and thought content normal.  Nursing note and vitals reviewed.  (all labs ordered are listed, but only abnormal results are displayed) Labs Reviewed  URINALYSIS, ROUTINE W REFLEX MICROSCOPIC - Abnormal; Notable for the following components:      Result Value   Specific Gravity, Urine >1.046 (*)    Ketones, ur 20 (*)    All other components within normal limits  CBC WITH DIFFERENTIAL/PLATELET - Abnormal; Notable  for the following components:   WBC 16.6 (*)    Neutro Abs 13.0 (*)    Monocytes Absolute 1.6 (*)    All other components within normal limits  COMPREHENSIVE METABOLIC PANEL WITH GFR - Abnormal; Notable for the following components:   Sodium 134 (*)    CO2 20 (*)    Glucose, Bld 125 (*)    All other components within normal limits  HEMOGLOBIN A1C - Abnormal; Notable for the  following components:   Hgb A1c MFr Bld 5.8 (*)    All other components within normal limits  COMPREHENSIVE METABOLIC PANEL WITH GFR - Abnormal; Notable for the following components:   Sodium 133 (*)    Glucose, Bld 121 (*)    All other components within normal limits  CBC - Abnormal; Notable for the following components:   WBC 15.9 (*)    All other components within normal limits  CULTURE, BLOOD (ROUTINE X 2)  CULTURE, BLOOD (ROUTINE X 2)  RESP PANEL BY RT-PCR (RSV, FLU A&B, COVID)  RVPGX2  EXPECTORATED SPUTUM ASSESSMENT W GRAM STAIN, RFLX TO RESP C  EXPECTORATED SPUTUM ASSESSMENT W GRAM STAIN, RFLX TO RESP C  LIPASE, BLOOD  SEDIMENTATION RATE  PROCALCITONIN  STREP PNEUMONIAE URINARY ANTIGEN  CBC WITH DIFFERENTIAL/PLATELET  C-REACTIVE PROTEIN  LEGIONELLA PNEUMOPHILA SEROGP 1 UR AG  I-STAT CG4 LACTIC ACID, ED  TROPONIN T, HIGH SENSITIVITY  TROPONIN T, HIGH SENSITIVITY    EKG: EKG Interpretation Date/Time:  Saturday November 04 2024 19:06:52 EST Ventricular Rate:  105 PR Interval:  175 QRS Duration:  141 QT Interval:  368 QTC Calculation: 487 R Axis:   -25  Text Interpretation: Incomplete analysis due to missing data in precordial lead(s) Sinus tachycardia Nonspecific intraventricular conduction delay Anterior infarct, old Repol abnrm suggests ischemia, lateral leads Missing lead(s): V1 When compared with ECG of 05/15/2021, HEART RATE has increased Confirmed by Raford Lenis ([id number]) on 11/05/2024 1:47:25 AM  Radiology: VAS US  LOWER EXTREMITY VENOUS (DVT) Result Date: 11/05/2024  Lower Venous DVT Study Patient Name:  Timothy Duran  Date of Exam:   11/05/2024 Medical Rec #: [id number]     Accession #:    [id number] Date of Birth: 1946-01-01      Patient Gender: M Patient Age:   58 years Exam Location:  [facility] Procedure:      VAS US  LOWER EXTREMITY VENOUS (DVT) Referring Phys: CAMILA NED --------------------------------------------------------------------------------   Indications: Swelling, Edema, and H/O cancer.  Comparison Study: Previous study of the left lower extremity on 8.30.2016. Performing Technologist: Edilia Elden Appl  Examination Guidelines: A complete evaluation includes B-mode imaging, spectral Doppler, color Doppler, and power Doppler as needed of all accessible portions of each vessel. Bilateral testing is considered an integral part of a complete examination. Limited examinations for reoccurring indications may be performed as noted. The reflux portion of the exam is performed with the patient in reverse Trendelenburg.  +---------+---------------+---------+-----------+----------+--------------+ RIGHT    CompressibilityPhasicitySpontaneityPropertiesThrombus Aging +---------+---------------+---------+-----------+----------+--------------+ CFV      Full           Yes      Yes                                 +---------+---------------+---------+-----------+----------+--------------+ SFJ      Full           Yes      Yes                                 +---------+---------------+---------+-----------+----------+--------------+  FV Prox  Full                                                        +---------+---------------+---------+-----------+----------+--------------+ FV Mid   Full                                                        +---------+---------------+---------+-----------+----------+--------------+ FV DistalFull                                                        +---------+---------------+---------+-----------+----------+--------------+ PFV      Full                                                        +---------+---------------+---------+-----------+----------+--------------+ POP      Full           Yes      Yes                                 +---------+---------------+---------+-----------+----------+--------------+ PTV      Full                                                         +---------+---------------+---------+-----------+----------+--------------+ PERO     Full                                                        +---------+---------------+---------+-----------+----------+--------------+   +---------+---------------+---------+-----------+----------+--------------+ LEFT     CompressibilityPhasicitySpontaneityPropertiesThrombus Aging +---------+---------------+---------+-----------+----------+--------------+ CFV      Full           Yes      Yes                                 +---------+---------------+---------+-----------+----------+--------------+ SFJ      Full           Yes      Yes                                 +---------+---------------+---------+-----------+----------+--------------+ FV Prox  Full                                                        +---------+---------------+---------+-----------+----------+--------------+  FV Mid   Full                                                        +---------+---------------+---------+-----------+----------+--------------+ FV DistalFull                                                        +---------+---------------+---------+-----------+----------+--------------+ PFV      Full                                                        +---------+---------------+---------+-----------+----------+--------------+ POP      Full           Yes      Yes                                 +---------+---------------+---------+-----------+----------+--------------+ PTV      Full                                                        +---------+---------------+---------+-----------+----------+--------------+ PERO     Full                                                        +---------+---------------+---------+-----------+----------+--------------+     Summary: BILATERAL: - No evidence of deep vein thrombosis seen in the lower extremities, bilaterally. -No evidence of  popliteal cyst, bilaterally.   *See table(s) above for measurements and observations.    Preliminary    CT ABDOMEN PELVIS W CONTRAST Result Date: 11/04/2024 EXAM: CT ABDOMEN AND PELVIS WITH CONTRAST 11/04/2024 11:24:57 PM TECHNIQUE: CT of the abdomen and pelvis was performed with the administration of 100 mL iohexol  (OMNIPAQUE ) 350 MG/ML injection. Multiplanar reformatted images are provided for review. Automated exposure control, iterative reconstruction, and/or weight-based adjustment of the mA/kV was utilized to reduce the radiation dose to as low as reasonably achievable. COMPARISON: Comparison study 05/15/2021. CLINICAL HISTORY: Right-sided abdominal pain. FINDINGS: LOWER CHEST: Lung bases show bilateral consolidation similar to that seen on prior CTA of the chest. LIVER: The liver is unremarkable. GALLBLADDER AND BILE DUCTS: The gallbladder has been surgically removed. No biliary ductal dilatation. SPLEEN: No acute abnormality. PANCREAS: Duodenal diverticulum is noted adjacent to the head of the pancreas. The pancreas appears normal. ADRENAL GLANDS: No acute abnormality. KIDNEYS, URETERS AND BLADDER: The kidneys demonstrate a normal enhancement pattern bilaterally. Scattered simple cysts are seen bilaterally. No follow-up is recommended. No obstructive changes are seen. A nonobstructing lower pole stone is noted on the right, measuring 7 mm. This is stable in appearance from the prior exam. The bladder is partially distended.  GI AND BOWEL: Stomach demonstrates no acute abnormality. Scattered diverticular change of the colon is noted. No obstructive or inflammatory changes of the colon are seen. The appendix is air-filled and within normal limits. Small bowel is unremarkable. There is no bowel obstruction. PERITONEUM AND RETROPERITONEUM: No ascites. No free air. VASCULATURE: Aorta is normal in caliber. Aortic calcifications are seen. LYMPH NODES: No lymphadenopathy. REPRODUCTIVE ORGANS: The prostate has been  surgically removed. BONES AND SOFT TISSUES: No acute osseous abnormality. No focal soft tissue abnormality. IMPRESSION: 1. No acute findings in the abdomen or pelvis related to the right-sided abdominal pain. 2. Stable 7 mm nonobstructing lower pole stone in the right kidney. 3. Diverticulosis without diverticulitis. Electronically signed by: Oneil Devonshire MD 11/04/2024 11:43 PM EST RP Workstation: [id number]   CT Angio Chest PE W/Cm &/Or Wo Cm Result Date: 11/04/2024 EXAM: CTA CHEST 11/04/2024 11:24:57 PM TECHNIQUE: CTA of the chest was performed after the administration of intravenous contrast. 100 mL iohexol  (OMNIPAQUE ) 350 MG/ML injection was administered. Multiplanar reformatted images are provided for review. MIP images are provided for review. Automated exposure control, iterative reconstruction, and/or weight based adjustment of the mA/kV was utilized to reduce the radiation dose to as low as reasonably achievable. COMPARISON: 10/14/2009 CLINICAL HISTORY: Chest and right shoulder pain. FINDINGS: PULMONARY ARTERIES: Subsegmental filling defect is noted in the right lower lobe consistent with acute pulmonary embolism. Main pulmonary artery is normal in caliber. The pulmonary artery shows a normal branching pattern bilaterally. MEDIASTINUM: The heart is at the upper limits of normal in size. No right heart strain is noted. Atherosclerotic calcifications in the thoracic aorta are noted. Dilatation of the ascending aorta measuring 4 cm is noted. Normal tapering is noted in the distal aorta. The esophagus, as visualized, is within normal limits. LYMPH NODES: No mediastinal, hilar or axillary lymphadenopathy. LUNGS AND PLEURA: Bilateral lower lobe consolidation is noted. Postsurgical changes are noted in the right upper lobe. Previously seen right upper lobe mass is no longer identified. No parenchymal nodules are seen. No pulmonary edema. No evidence of pleural effusion or pneumothorax. UPPER ABDOMEN: Limited  images of the upper abdomen are unremarkable. SOFT TISSUES AND BONES: No acute bone or soft tissue abnormality. IMPRESSION: 1. Acute pulmonary embolism in the right lower lobe. No right heart strain. 2. Bilateral lower lobe consolidation. 3. Dilatation of the ascending aorta measuring 4 cm.Recommend annual imaging followup by CTA or MRA. This recommendation follows 2010 ACCF/AHA/AATS/ACR/ASA/SCA/SCAI/SIR/STS/SVM Guidelines for the Diagnosis and Management of Patients with Thoracic Aortic Disease. Circulation. 2010; 121: Z733-z630. Aortic aneurysm NOS (ICD10-I71.9) Findings were called to Dr. Fredia at the time of exam interpretation. Electronically signed by: Oneil Devonshire MD 11/04/2024 11:38 PM EST RP Workstation: [id number]      Medications Ordered in the ED  cefTRIAXone  (ROCEPHIN ) 2 g in sodium chloride  0.9 % 100 mL IVPB (0 g Intravenous Stopped 11/05/24 1027)  ondansetron  (ZOFRAN ) tablet 4 mg (has no administration in time range)    Or  ondansetron  (ZOFRAN ) injection 4 mg (has no administration in time range)  albuterol  (PROVENTIL ) (2.5 MG/3ML) 0.083% nebulizer solution 2.5 mg (has no administration in time range)  morphine  (PF) 2 MG/ML injection 2 mg (2 mg Intravenous Given 11/05/24 0942)  azithromycin  (ZITHROMAX ) tablet 500 mg (500 mg Oral Given 11/05/24 0328)  acetaminophen  (TYLENOL ) tablet 650 mg (has no administration in time range)  traMADol  (ULTRAM ) tablet 50 mg (has no administration in time range)  apixaban  (ELIQUIS ) tablet 10 mg (10 mg Oral Given 11/05/24 0941)  Followed by  apixaban  (ELIQUIS ) tablet 5 mg (has no administration in time range)  morphine  (PF) 2 MG/ML injection 2 mg (2 mg Intravenous Given 11/04/24 2012)  sodium chloride  0.9 % bolus 500 mL (0 mLs Intravenous Stopped 11/04/24 2216)  morphine  (PF) 2 MG/ML injection 2 mg (2 mg Intravenous Given 11/04/24 2232)  HYDROmorphone  (DILAUDID ) injection 0.5 mg (0.5 mg Intravenous Given 11/04/24 2255)  ceFEPIme  (MAXIPIME ) 2 g in sodium  chloride 0.9 % 100 mL IVPB (0 g Intravenous Stopped 11/05/24 0109)  ondansetron  (ZOFRAN ) injection 4 mg (4 mg Intravenous Given 11/04/24 2254)  vancomycin  (VANCOREADY) IVPB 1500 mg/300 mL (0 mg Intravenous Stopped 11/05/24 0310)  iohexol  (OMNIPAQUE ) 350 MG/ML injection 100 mL (100 mLs Intravenous Contrast Given 11/04/24 2318)     This patient presents to the ED with chief complaint(s) of right upper quadrant pain with pertinent past medical history of hypertension, hyperlipidemia, prior kidney stone, status post cholecystectomy. The complaint involves an extensive differential diagnosis and also carries with it a high risk of complications and morbidity.    Additional history obtained from family. I have also reviewed primary care records  The differential diagnosis includes renal colic, pulmonary embolism given pleuritic component, pneumonia, retained stone, other hepatic/ductal process  The initial management included labs, urinalysis, EKG, analgesia, CT chest to rule out PE, CT abdomen and pelvis.    Reassessments:  The following labs were independently interpreted: Labs significant for white count of 16.6 with a normal lactate 1.8.  Will check blood cultures and start empiric antibiotics.  I independently visualized the following imaging with scope of interpretation limited to determining acute life threatening conditions related to emergency care: CT scans pending  Treatment and Reassessment: IV morphine  with minimal improvement, trying IV Dilaudid   Consultation: - Consulted or discussed management/test interpretation with external professional: Pending  Consideration for admission or further workup: Likely needs admission given pain and requiring IV opiates, signed out to oncoming attending pending CT reading.  Social Determinants of health: None  Final diagnoses:  Other acute pulmonary embolism without acute cor pulmonale [facility])    ED Discharge Orders     None           Fredia Rosette Kirsch, MD 11/05/24 1615  "

## 2024-11-04 NOTE — Progress Notes (Signed)
 PHARMACY - ANTICOAGULATION CONSULT NOTE  Pharmacy Consult for Apixaban  Indication: pulmonary embolus  Allergies[1]  Patient Measurements:    Vital Signs: Temp: 98 F (36.7 C) (01/17 2145) Temp Source: Oral (01/17 2145) BP: 145/82 (01/17 2145) Pulse Rate: 94 (01/17 2145)  Labs: Recent Labs    11/04/24 2141  HGB 16.3  HCT 48.5  PLT 224  CREATININE 0.75    CrCl cannot be calculated (Unknown ideal weight.).   Medical History: Past Medical History:  Diagnosis Date   Abdominal hernia    in there now (05/20/2015)   Arthritis    terrible; all over (05/20/2015)   Basal cell carcinoma    left neck   Diverticulosis    Epistaxis 05/20/2015   hospitalized   Hypercholesterolemia    Hypertension    Kidney stone    Prostate cancer (HCC) 2010   prostatectomy   TIA (transient ischemic attack) 2005   ?    Medications:  No oral anticoagulation PTA  Assessment: 79 yr male with c/o abdominal, flank and shoulder pain.  CTAngio = + acute PE with no evidence of right heart strain.  Goal of Therapy:  Full anticoagulation Monitor platelets by anticoagulation protocol: Yes   Plan:  Apixaban  10mg  po BID x 7 days, then 5mg  po BID Provide patient education prior to discharge  Arvin Gauss, PharmD 11/04/2024,11:47 PM      [1]  Allergies Allergen Reactions   Oxycodone  Other (See Comments)    Pt gets very confused and anxious with this medication   Other Other (See Comments)    Nasal packing- headaches and facial pain   Penicillins Other (See Comments)    Blisters  Patient states this was a childhood reaction

## 2024-11-04 NOTE — Progress Notes (Signed)
 [facility] urgent CARE eNCOUnter  Educational Psychologist PA-C -------------------------------------------------------------------------------------------------------------------  FINAL IMPRESSIONS    Provider Remarks:  Assessment/Plan: Diagnoses and all orders for this visit:  Mid back pain on right side  Acute pain of right shoulder  History of cholecystectomy  History of lung surgery  Other orders -     cholecalciferol, vitamin D3, (CHOLECALCIFEROL, VIT D3,,BULK,) 100,000 unit/gram Powd; Take 1 tablet by mouth daily. -     sod chloride/pot chloride/mag (MAGNESIUM-POTASS-SODIUM SALT ORAL); Take 1 tablet by mouth daily. -     acetaminophen  (TYLENOL ) 325 MG tablet; Take 2 tablets (650 mg total) by mouth. -     meclizine  (ANTIVERT ) 25 mg tablet; Take 1 tablet (25 mg total) by mouth.    79 year old male in past medical history of TIAs on Plavix , vertigo, hyperlipidemia, hypertension, history of prostate cancer, GERD, history of lung cancer right side with surgery and 2010.  History of cholecystectomy.  History of renal lithiasis currently has a stone on the left side.  Presents with daughter today with mid right back pain increased on deep inspiration and with coughing or sneezing.  He denies any chest pain, he has had some right shoulder pain but he states it seems unrelated to his back pain.  His daughter interjects that he has been getting worse all day.  Vital signs are stable patient's pulse rate is 114 and O2 sat is at 93% room air.  Discussed with the patient and his daughter that he needs a chest x-Stein, and blood work.  He denies any fever or chills but the patient recalls last week he had a cold but that seemed to resolve on its own.  This pain woke him up around 3 AM this morning.  Worse with breathing deep or coughing.  He states he worked yesterday and did not have any pain.  Patient skin shows no rash, diminished lung sounds in bases.  Tachycardia, recommended that he be  seen and evaluated at the emergency department.  Could be pneumonia, new tumor, pneumothorax .  They choose to go to [facility].  Daughter states she feels comfortable taking him there.  They verbalized understanding and had no questions at discharge.  Urgent Care Labs Ordered: No results found for this visit on 11/04/24. _____________________________________________________________________  CHIEF COMPLAINT   Chief Complaint  Patient presents with   Shoulder Pain    Begins in patient's R side and radiates to R shoulder. No injury noted.    Patient information was obtained from: patient. History/Exam limitations: none. Patient arrived by private vehicle and walked into facility.  HISTORY OF PRESENT ILLNESS:  Timothy Duran is a 79 y.o. male who presents today for evaluation of: Pain started this morning around 3 AM woke patient up he states that he has been at right sided back pain that has gotten increasingly worse, worse with breathing deep, or coughing, throughout the day, and he has had some right shoulder pain as well.  The patient does not have a gallbladder.  he has also had tumor removal from the right side lung in 2010, currently on Plavix  for  TIAs.  He denies any chest pain, no pain going down the arm.  Last week he had a cold and had coughing but that seemed to resolve on its own.  He states he worked yesterday at the [employer].    Presents today with his daughter Timothy Duran.  REVIEW OF SYSTEMS Review of  systems negative unless noted in HPI.  Records Review:  1.  Old medical records, current nursing notes, and previous provider notes have been read, reviewed and appreciated if available and when applicable to today's visit. 2.  Chart review included allergies, current medications, past family history, past medical history, past social history, past surgical history and problem list.  PAST MEDICAL HISTORY   Past Medical History[1]  SURGICAL HISTORY   Past  Surgical History[2]  IMMUNIZATION HISTORY Immunization History  Administered Date(s) Administered   Influenza Vaccine Quad(IM)6 MO-Adult(PF) 07/11/2015   Influenza Virus Vaccine, unspecified formulation 07/11/2015     CURRENT MEDICATIONS   Current Medications[3]  ALLERGIES   Allergies[4]  FAMILY HISTORY   Family History[5]     Tobacco Use: Medium Risk (11/04/2024)   Patient History    Smoking Tobacco Use: Former    Smokeless Tobacco Use: Never    Passive Exposure: Never      PHYSICAL EXAM   Vital Signs:  BP 140/84   Pulse 114   Temp 36.8 C (98.3 F) (Oral)   Resp 20   Ht 172.7 cm (5' 8)   Wt 74.8 kg (165 lb)   SpO2 93%   BMI 25.09 kg/m   Body mass index is 25.09 kg/m. No LMP for male patient.  Physical Examination:  General appearance: alert and oriented, no emotional distress, calm and cooperative, and appears stated age. Head: normocephalic and without obvious abnormality. Eyes: lids and lashes normal, conjunctivae and sclerae normal, corneas clear and pupils equal, round, reactive to light and accomodation. Nose: nares normal and no discharge. Neck: supple, symmetrical, and trachea midline. Lungs:diminished BS lower bilateral on auscultation bilaterally with unlabored breathing, no use of accessory muscles for respiration, no tachypnea or dyspnea. Heart: tachycardia,  S1, S2 normal. Abdomen: soft, non-tender, non-distended, and bowel sounds normoactive. Extremities: extremities normal, atraumatic, and no cyanosis observed. Neurologic: mental status appropriate for current medical condition; speech normal and unpressured. Mental Status: alert and oriented with appropriate appearance, behavior, and attitude. Skin: normothermic, no diaphoresis, obvious rash, no pallor, cyanosis, or flushing.   Pain mid right back with deep inspiration or coughing.   URGENT CARE MEDICAL DECISION MAKING & CLINIC COURSE  Urgent Care Imaging: No image results found.     Urgent Care Labs Ordered: No results found for this visit on 11/04/24.  Urgent Care Clinic Treatments: No treatments in office clinically appropriate and/or necessary at this time.  Requested Consultations: None   Urgent Care Clinic Plan: As outlined above in the clinical record.  DISPOSITION  Transferred to ED Patient Disposition  Indication for Disposition: Requires a higher level of care  Chief Complaint: Mid right thoracic back pain increased with deep inspiration and or coughing increasingly worse throughout the day, right shoulder pain, past medical history of right lung tumor with surgery in 2010.  Past medical history of cholecystectomy.   Tashon Schnee given instructions to proceed to ED via personal vehicle; Location: [facility] due to Requires a higher level of care, testing, and/or treatment not able to be provided at Urgent Care   Report called un:WNWZ       Disposition Upon Discharge:  1.  Routine symptom specific, illness specific and/or disease specific instructions were discussed with the patient and/or caregiver at length.  2.  The differential diagnosis for the patient's specific symptoms were reviewed and discussed with the patient/caregiver at length; the patient/caregiver expressed understanding of all discussed and their relevant questions were all satisfactorily answered.  3.  Return to care should the presenting symptoms  recur, persist, or worsen in any way; or in the alternative, if new symptoms or complaints develop.  4.  The patient and any family present were given verbal and/or written discharge instructions as clinically indicated and appropriate.  5.  Continue OTC medications as needed and tolerated for control of the currently reported symptoms, if no conflict with the currently prescribed medications  -------------------------------------------------------------------------------------------------------------------------------------------- Note  - This record has been created using [employer]. Chart creation errors have been sought but may not always have been located. Such creation errors do not reflect on the standard of medical care.       [1] Past Medical History: Diagnosis Date   High cholesterol    HTN (hypertension)    Vertigo   [2] Past Surgical History: Procedure Laterality Date   BLADDER STONE REMOVAL     KNEE SURGERY     SHOULDER SURGERY     TUMOR LUNG     [3]  Current Outpatient Medications:    acetaminophen  (TYLENOL ) 325 MG tablet, Take 2 tablets (650 mg total) by mouth., Disp: , Rfl:    meclizine  (ANTIVERT ) 25 mg tablet, Take 1 tablet (25 mg total) by mouth., Disp: , Rfl:    cholecalciferol, vitamin D3, (CHOLECALCIFEROL, VIT D3,,BULK,) 100,000 unit/gram Powd, Take 1 tablet by mouth daily., Disp: , Rfl:    clopidogreL  (PLAVIX ) 75 mg tablet, Take 1 tablet by mouth in the morning., Disp: , Rfl:    olmesartan -hydrochlorothiazide  (BENICAR  HCT) 20-12.5 mg per tablet, Take 1 tablet by mouth in the morning., Disp: , Rfl:    sod chloride/pot chloride/mag (MAGNESIUM-POTASS-SODIUM SALT ORAL), Take 1 tablet by mouth daily., Disp: , Rfl:  [4] Allergies Allergen Reactions   Oxycodone  Other (See Comments)    Pt gets very confused and anxious with this medication   Other Other (See Comments)    Nasal packing- headaches and facial pain   Penicillins Other (See Comments)    Blisters   Has patient had a PCN reaction causing immediate rash, facial/tongue/throat swelling, SOB or lightheadedness with hypotension: (Unknown}  Has patient had a PCN reaction causing severe rash involving mucus membranes or skin necrosis: Unknown  Has patient had a PCN reaction that required hospitalization No  Has patient had a PCN reaction occurring within the last 10 years: No  If all of the above answers are NO, then may proceed with Cephalosporin use.  Blisters   Patient states this was a childhood reaction   Blisters   Has patient had a PCN reaction causing immediate rash, facial/tongue/throat swelling, SOB or lightheadedness with hypotension: (Unknown}  Has patient had a PCN reaction causing severe rash involving mucus membranes or skin necrosis: Unknown  Has patient had a PCN reaction that required hospitalization No  Has patient had a PCN reaction occurring within the last 10 years: No  If all of the above answers are NO, then may proceed with Cephalosporin use.  Blisters   Patient states this was a childhood reaction  [5] Family History Problem Relation Age of Onset   Cancer Mother    Cancer Father    Cancer Sister    Cancer Brother

## 2024-11-04 NOTE — ED Triage Notes (Signed)
 Pt reports at 0300 he woke up with pain in right upper abd/flank and right shoulder pain that has gradually got worse today. Pt denies n/v/d, pt denies any urinary complaints ,pt denies trauma/injury to right shoulder. Pt does reports hx of kidney stone on left side

## 2024-11-04 NOTE — ED Provider Notes (Signed)
 Care assumed from Dr. Fredia, patient with right upper quadrant pain which is pleuritic in nature pending results of CT angiogram of chest and CT of abdomen and pelvis.  CT scans show pulmonary embolism involving subsegmental arteries on the right and also bilateral lower lobe consolidation.  No evidence of right heart strain.  Also bilateral lower lobe consolidation.  Incidental finding of dilatation of the ascending aorta at 4 cm which will need annual follow-up.  CT of abdomen and pelvis shows no acute findings.  I have independently viewed all the images, and agree with the radiologist's interpretation.  He will need treatment for both pulmonary embolism and pneumonia.  Initial dose of apixaban  had been ordered by Dr. Fredia. I have discussed the case with Dr. Debby of [facility], who agrees to admit the patient.  Results for orders placed or performed during the hospital encounter of 11/04/24  Urinalysis, Routine w reflex microscopic -Urine, Clean Catch   Collection Time: 11/04/24  1:56 AM  Result Value Ref Range   Color, Urine YELLOW YELLOW   APPearance CLEAR CLEAR   Specific Gravity, Urine >1.046 (H) 1.005 - 1.030   pH 5.0 5.0 - 8.0   Glucose, UA NEGATIVE NEGATIVE mg/dL   Hgb urine dipstick NEGATIVE NEGATIVE   Bilirubin Urine NEGATIVE NEGATIVE   Ketones, ur 20 (A) NEGATIVE mg/dL   Protein, ur NEGATIVE NEGATIVE mg/dL   Nitrite NEGATIVE NEGATIVE   Leukocytes,Ua NEGATIVE NEGATIVE  I-Stat Lactic Acid   Collection Time: 11/04/24  8:47 PM  Result Value Ref Range   Lactic Acid, Venous 1.8 0.5 - 1.9 mmol/L  CBC with Differential/Platelet   Collection Time: 11/04/24  9:41 PM  Result Value Ref Range   WBC 16.6 (H) 4.0 - 10.5 K/uL   RBC 5.20 4.22 - 5.81 MIL/uL   Hemoglobin 16.3 13.0 - 17.0 g/dL   HCT 51.4 60.9 - 47.9 %   MCV 93.3 80.0 - 100.0 fL   MCH 31.3 26.0 - 34.0 pg   MCHC 33.6 30.0 - 36.0 g/dL   RDW 86.4 88.4 - 84.4 %   Platelets 224 150 - 400 K/uL   nRBC 0.0 0.0 - 0.2 %    Neutrophils Relative % 79 %   Neutro Abs 13.0 (H) 1.7 - 7.7 K/uL   Lymphocytes Relative 11 %   Lymphs Abs 1.9 0.7 - 4.0 K/uL   Monocytes Relative 10 %   Monocytes Absolute 1.6 (H) 0.1 - 1.0 K/uL   Eosinophils Relative 0 %   Eosinophils Absolute 0.0 0.0 - 0.5 K/uL   Basophils Relative 0 %   Basophils Absolute 0.1 0.0 - 0.1 K/uL   Immature Granulocytes 0 %   Abs Immature Granulocytes 0.06 0.00 - 0.07 K/uL  Comprehensive metabolic panel with GFR   Collection Time: 11/04/24  9:41 PM  Result Value Ref Range   Sodium 134 (L) 135 - 145 mmol/L   Potassium 4.2 3.5 - 5.1 mmol/L   Chloride 102 98 - 111 mmol/L   CO2 20 (L) 22 - 32 mmol/L   Glucose, Bld 125 (H) 70 - 99 mg/dL   BUN 17 8 - 23 mg/dL   Creatinine, Ser 9.24 0.61 - 1.24 mg/dL   Calcium  9.8 8.9 - 10.3 mg/dL   Total Protein 7.1 6.5 - 8.1 g/dL   Albumin 4.1 3.5 - 5.0 g/dL   AST 17 15 - 41 U/L   ALT 16 0 - 44 U/L   Alkaline Phosphatase 95 38 - 126 U/L  Total Bilirubin 0.6 0.0 - 1.2 mg/dL   GFR, Estimated >39 >39 mL/min   Anion gap 12 5 - 15  Lipase, blood   Collection Time: 11/04/24  9:41 PM  Result Value Ref Range   Lipase 23 11 - 51 U/L  Troponin T, High Sensitivity   Collection Time: 11/04/24  9:41 PM  Result Value Ref Range   Troponin T High Sensitivity <15 0 - 19 ng/L   CT ABDOMEN PELVIS W CONTRAST Result Date: 11/04/2024 EXAM: CT ABDOMEN AND PELVIS WITH CONTRAST 11/04/2024 11:24:57 PM TECHNIQUE: CT of the abdomen and pelvis was performed with the administration of 100 mL iohexol  (OMNIPAQUE ) 350 MG/ML injection. Multiplanar reformatted images are provided for review. Automated exposure control, iterative reconstruction, and/or weight-based adjustment of the mA/kV was utilized to reduce the radiation dose to as low as reasonably achievable. COMPARISON: Comparison study 05/15/2021. CLINICAL HISTORY: Right-sided abdominal pain. FINDINGS: LOWER CHEST: Lung bases show bilateral consolidation similar to that seen on prior CTA of  the chest. LIVER: The liver is unremarkable. GALLBLADDER AND BILE DUCTS: The gallbladder has been surgically removed. No biliary ductal dilatation. SPLEEN: No acute abnormality. PANCREAS: Duodenal diverticulum is noted adjacent to the head of the pancreas. The pancreas appears normal. ADRENAL GLANDS: No acute abnormality. KIDNEYS, URETERS AND BLADDER: The kidneys demonstrate a normal enhancement pattern bilaterally. Scattered simple cysts are seen bilaterally. No follow-up is recommended. No obstructive changes are seen. A nonobstructing lower pole stone is noted on the right, measuring 7 mm. This is stable in appearance from the prior exam. The bladder is partially distended. GI AND BOWEL: Stomach demonstrates no acute abnormality. Scattered diverticular change of the colon is noted. No obstructive or inflammatory changes of the colon are seen. The appendix is air-filled and within normal limits. Small bowel is unremarkable. There is no bowel obstruction. PERITONEUM AND RETROPERITONEUM: No ascites. No free air. VASCULATURE: Aorta is normal in caliber. Aortic calcifications are seen. LYMPH NODES: No lymphadenopathy. REPRODUCTIVE ORGANS: The prostate has been surgically removed. BONES AND SOFT TISSUES: No acute osseous abnormality. No focal soft tissue abnormality. IMPRESSION: 1. No acute findings in the abdomen or pelvis related to the right-sided abdominal pain. 2. Stable 7 mm nonobstructing lower pole stone in the right kidney. 3. Diverticulosis without diverticulitis. Electronically signed by: Oneil Devonshire MD 11/04/2024 11:43 PM EST RP Workstation: [id number]   CT Angio Chest PE W/Cm &/Or Wo Cm Result Date: 11/04/2024 EXAM: CTA CHEST 11/04/2024 11:24:57 PM TECHNIQUE: CTA of the chest was performed after the administration of intravenous contrast. 100 mL iohexol  (OMNIPAQUE ) 350 MG/ML injection was administered. Multiplanar reformatted images are provided for review. MIP images are provided for review. Automated  exposure control, iterative reconstruction, and/or weight based adjustment of the mA/kV was utilized to reduce the radiation dose to as low as reasonably achievable. COMPARISON: 10/14/2009 CLINICAL HISTORY: Chest and right shoulder pain. FINDINGS: PULMONARY ARTERIES: Subsegmental filling defect is noted in the right lower lobe consistent with acute pulmonary embolism. Main pulmonary artery is normal in caliber. The pulmonary artery shows a normal branching pattern bilaterally. MEDIASTINUM: The heart is at the upper limits of normal in size. No right heart strain is noted. Atherosclerotic calcifications in the thoracic aorta are noted. Dilatation of the ascending aorta measuring 4 cm is noted. Normal tapering is noted in the distal aorta. The esophagus, as visualized, is within normal limits. LYMPH NODES: No mediastinal, hilar or axillary lymphadenopathy. LUNGS AND PLEURA: Bilateral lower lobe consolidation is noted. Postsurgical changes are noted in  the right upper lobe. Previously seen right upper lobe mass is no longer identified. No parenchymal nodules are seen. No pulmonary edema. No evidence of pleural effusion or pneumothorax. UPPER ABDOMEN: Limited images of the upper abdomen are unremarkable. SOFT TISSUES AND BONES: No acute bone or soft tissue abnormality. IMPRESSION: 1. Acute pulmonary embolism in the right lower lobe. No right heart strain. 2. Bilateral lower lobe consolidation. 3. Dilatation of the ascending aorta measuring 4 cm.Recommend annual imaging followup by CTA or MRA. This recommendation follows 2010 ACCF/AHA/AATS/ACR/ASA/SCA/SCAI/SIR/STS/SVM Guidelines for the Diagnosis and Management of Patients with Thoracic Aortic Disease. Circulation. 2010; 121: [id number]. Aortic aneurysm NOS (ICD10-I71.9) Findings were called to Dr. Fredia at the time of exam interpretation. Electronically signed by: Oneil Devonshire MD 11/04/2024 11:38 PM EST RP Workstation: [id number] Raford Alm, MD 98/81/73  [id number]

## 2024-11-05 ENCOUNTER — Inpatient Hospital Stay (HOSPITAL_COMMUNITY)

## 2024-11-05 DIAGNOSIS — K219 Gastro-esophageal reflux disease without esophagitis: Secondary | ICD-10-CM | POA: Diagnosis present

## 2024-11-05 DIAGNOSIS — M7989 Other specified soft tissue disorders: Secondary | ICD-10-CM | POA: Diagnosis not present

## 2024-11-05 DIAGNOSIS — Z9049 Acquired absence of other specified parts of digestive tract: Secondary | ICD-10-CM | POA: Diagnosis not present

## 2024-11-05 DIAGNOSIS — I77810 Thoracic aortic ectasia: Secondary | ICD-10-CM | POA: Diagnosis present

## 2024-11-05 DIAGNOSIS — Z79899 Other long term (current) drug therapy: Secondary | ICD-10-CM | POA: Diagnosis not present

## 2024-11-05 DIAGNOSIS — Z85828 Personal history of other malignant neoplasm of skin: Secondary | ICD-10-CM | POA: Diagnosis not present

## 2024-11-05 DIAGNOSIS — E7800 Pure hypercholesterolemia, unspecified: Secondary | ICD-10-CM | POA: Diagnosis present

## 2024-11-05 DIAGNOSIS — Z8673 Personal history of transient ischemic attack (TIA), and cerebral infarction without residual deficits: Secondary | ICD-10-CM | POA: Diagnosis not present

## 2024-11-05 DIAGNOSIS — I2699 Other pulmonary embolism without acute cor pulmonale: Secondary | ICD-10-CM | POA: Diagnosis present

## 2024-11-05 DIAGNOSIS — I2609 Other pulmonary embolism with acute cor pulmonale: Secondary | ICD-10-CM

## 2024-11-05 DIAGNOSIS — Z885 Allergy status to narcotic agent status: Secondary | ICD-10-CM | POA: Diagnosis not present

## 2024-11-05 DIAGNOSIS — J189 Pneumonia, unspecified organism: Secondary | ICD-10-CM

## 2024-11-05 DIAGNOSIS — Z91010 Allergy to peanuts: Secondary | ICD-10-CM | POA: Diagnosis not present

## 2024-11-05 DIAGNOSIS — N200 Calculus of kidney: Secondary | ICD-10-CM | POA: Diagnosis present

## 2024-11-05 DIAGNOSIS — I10 Essential (primary) hypertension: Secondary | ICD-10-CM | POA: Diagnosis present

## 2024-11-05 DIAGNOSIS — Z8546 Personal history of malignant neoplasm of prostate: Secondary | ICD-10-CM | POA: Diagnosis not present

## 2024-11-05 DIAGNOSIS — Z800 Family history of malignant neoplasm of digestive organs: Secondary | ICD-10-CM | POA: Diagnosis not present

## 2024-11-05 DIAGNOSIS — Z8042 Family history of malignant neoplasm of prostate: Secondary | ICD-10-CM | POA: Diagnosis not present

## 2024-11-05 DIAGNOSIS — Z7902 Long term (current) use of antithrombotics/antiplatelets: Secondary | ICD-10-CM | POA: Diagnosis not present

## 2024-11-05 DIAGNOSIS — Z7901 Long term (current) use of anticoagulants: Secondary | ICD-10-CM | POA: Diagnosis not present

## 2024-11-05 DIAGNOSIS — Z87891 Personal history of nicotine dependence: Secondary | ICD-10-CM | POA: Diagnosis not present

## 2024-11-05 LAB — RESP PANEL BY RT-PCR (RSV, FLU A&B, COVID)  RVPGX2
Influenza A by PCR: NEGATIVE
Influenza B by PCR: NEGATIVE
Resp Syncytial Virus by PCR: NEGATIVE
SARS Coronavirus 2 by RT PCR: NEGATIVE

## 2024-11-05 LAB — ECHOCARDIOGRAM COMPLETE
AR max vel: 2.65 cm2
AV Area VTI: 2.75 cm2
AV Area mean vel: 2.44 cm2
AV Mean grad: 7 mmHg
AV Peak grad: 13.2 mmHg
Ao pk vel: 1.82 m/s
Area-P 1/2: 3.28 cm2
Calc EF: 66.2 %
S' Lateral: 2.7 cm
Single Plane A2C EF: 72.6 %
Single Plane A4C EF: 63.8 %
Weight: 2752 [oz_av]

## 2024-11-05 LAB — URINALYSIS, ROUTINE W REFLEX MICROSCOPIC
Bilirubin Urine: NEGATIVE
Glucose, UA: NEGATIVE mg/dL
Hgb urine dipstick: NEGATIVE
Ketones, ur: 20 mg/dL — AB
Leukocytes,Ua: NEGATIVE
Nitrite: NEGATIVE
Protein, ur: NEGATIVE mg/dL
Specific Gravity, Urine: >1.046 — ABNORMAL HIGH (ref 1.005–1.030)
pH: 5 (ref 5.0–8.0)

## 2024-11-05 LAB — CBC
HCT: 46.3 % (ref 39.0–52.0)
Hemoglobin: 15.3 g/dL (ref 13.0–17.0)
MCH: 31.4 pg (ref 26.0–34.0)
MCHC: 33 g/dL (ref 30.0–36.0)
MCV: 95.1 fL (ref 80.0–100.0)
Platelets: 205 K/uL (ref 150–400)
RBC: 4.87 MIL/uL (ref 4.22–5.81)
RDW: 13.6 % (ref 11.5–15.5)
WBC: 15.9 K/uL — ABNORMAL HIGH (ref 4.0–10.5)
nRBC: 0 % (ref 0.0–0.2)

## 2024-11-05 LAB — COMPREHENSIVE METABOLIC PANEL WITH GFR
ALT: 13 U/L (ref 0–44)
AST: 17 U/L (ref 15–41)
Albumin: 3.9 g/dL (ref 3.5–5.0)
Alkaline Phosphatase: 87 U/L (ref 38–126)
Anion gap: 9 (ref 5–15)
BUN: 16 mg/dL (ref 8–23)
CO2: 23 mmol/L (ref 22–32)
Calcium: 9.7 mg/dL (ref 8.9–10.3)
Chloride: 101 mmol/L (ref 98–111)
Creatinine, Ser: 0.8 mg/dL (ref 0.61–1.24)
GFR, Estimated: >60 mL/min
Glucose, Bld: 121 mg/dL — ABNORMAL HIGH (ref 70–99)
Potassium: 4.7 mmol/L (ref 3.5–5.1)
Sodium: 133 mmol/L — ABNORMAL LOW (ref 135–145)
Total Bilirubin: 0.8 mg/dL (ref 0.0–1.2)
Total Protein: 6.9 g/dL (ref 6.5–8.1)

## 2024-11-05 LAB — HEMOGLOBIN A1C
Hgb A1c MFr Bld: 5.8 % — ABNORMAL HIGH (ref 4.8–5.6)
Mean Plasma Glucose: 119.76 mg/dL

## 2024-11-05 LAB — PROCALCITONIN: Procalcitonin: 0.13 ng/mL

## 2024-11-05 LAB — STREP PNEUMONIAE URINARY ANTIGEN: Strep Pneumo Urinary Antigen: NEGATIVE

## 2024-11-05 LAB — SEDIMENTATION RATE: Sed Rate: 5 mm/h (ref 0–16)

## 2024-11-05 MED ORDER — ALBUTEROL SULFATE (2.5 MG/3ML) 0.083% IN NEBU: 2.5000 mg | INHALATION_SOLUTION | RESPIRATORY_TRACT | Status: DC | PRN

## 2024-11-05 MED ORDER — APIXABAN 5 MG PO TABS
10.0000 mg | ORAL_TABLET | Freq: Two times a day (BID) | ORAL | Status: DC
  Administered 2024-11-05 – 2024-11-06 (×3): 10 mg via ORAL
  Filled 2024-11-05 (×3): qty 2

## 2024-11-05 MED ORDER — ONDANSETRON HCL 4 MG PO TABS: 4.0000 mg | ORAL_TABLET | Freq: Four times a day (QID) | ORAL | Status: DC | PRN

## 2024-11-05 MED ORDER — AZITHROMYCIN 250 MG PO TABS
500.0000 mg | ORAL_TABLET | Freq: Every day | ORAL | Status: DC
  Administered 2024-11-05 – 2024-11-06 (×2): 500 mg via ORAL
  Filled 2024-11-05 (×2): qty 2

## 2024-11-05 MED ORDER — ACETAMINOPHEN 325 MG PO TABS: 650.0000 mg | ORAL_TABLET | Freq: Four times a day (QID) | ORAL | Status: DC | PRN

## 2024-11-05 MED ORDER — HYDROMORPHONE HCL 1 MG/ML IJ SOLN
0.5000 mg | INTRAMUSCULAR | Status: DC | PRN
  Administered 2024-11-05: 0.5 mg via INTRAVENOUS
  Filled 2024-11-05: qty 1

## 2024-11-05 MED ORDER — TRAMADOL HCL 50 MG PO TABS: 50.0000 mg | ORAL_TABLET | Freq: Four times a day (QID) | ORAL | Status: DC | PRN

## 2024-11-05 MED ORDER — LIDOCAINE 5 % EX PTCH
1.0000 | MEDICATED_PATCH | CUTANEOUS | Status: DC
  Administered 2024-11-05: 1 via TRANSDERMAL
  Filled 2024-11-05: qty 1

## 2024-11-05 MED ORDER — SODIUM CHLORIDE 0.9 % IV SOLN: 500.0000 mg | INTRAVENOUS | Status: DC

## 2024-11-05 MED ORDER — SODIUM CHLORIDE 0.9 % IV SOLN
2.0000 g | INTRAVENOUS | Status: DC
  Administered 2024-11-05 – 2024-11-06 (×2): 2 g via INTRAVENOUS
  Filled 2024-11-05 (×2): qty 20

## 2024-11-05 MED ORDER — MORPHINE SULFATE (PF) 2 MG/ML IV SOLN
2.0000 mg | INTRAVENOUS | Status: DC | PRN
  Administered 2024-11-05 (×2): 2 mg via INTRAVENOUS
  Filled 2024-11-05 (×2): qty 1

## 2024-11-05 MED ORDER — ONDANSETRON HCL 4 MG/2ML IJ SOLN: 4.0000 mg | Freq: Four times a day (QID) | INTRAMUSCULAR | Status: DC | PRN

## 2024-11-05 MED ORDER — APIXABAN 5 MG PO TABS: 5.0000 mg | ORAL_TABLET | Freq: Two times a day (BID) | ORAL | Status: DC

## 2024-11-05 NOTE — Discharge Instructions (Signed)
 Information on my medicine - ELIQUIS  (apixaban )  This medication education was reviewed with me or my healthcare representative as part of my discharge preparation.    Why was Eliquis  prescribed for you? Eliquis  was prescribed to treat blood clots that may have been found in the veins of your legs (deep vein thrombosis) or in your lungs (pulmonary embolism) and to reduce the risk of them occurring again.  What do You need to know about Eliquis  ? The starting dose is 10 mg (two 5 mg tablets) taken TWICE daily for the FIRST SEVEN (7) DAYS, then on 11/12/24 the dose is reduced to ONE 5 mg tablet taken TWICE daily.  Eliquis  may be taken with or without food.   Try to take the dose about the same time in the morning and in the evening. If you have difficulty swallowing the tablet whole please discuss with your pharmacist how to take the medication safely.  Take Eliquis  exactly as prescribed and DO NOT stop taking Eliquis  without talking to the doctor who prescribed the medication.  Stopping may increase your risk of developing a new blood clot.  Refill your prescription before you run out.  After discharge, you should have regular check-up appointments with your healthcare provider that is prescribing your Eliquis .    What do you do if you miss a dose? If a dose of ELIQUIS  is not taken at the scheduled time, take it as soon as possible on the same day and twice-daily administration should be resumed. The dose should not be doubled to make up for a missed dose.  Important Safety Information A possible side effect of Eliquis  is bleeding. You should call your healthcare provider right away if you experience any of the following: Bleeding from an injury or your nose that does not stop. Unusual colored urine (red or dark brown) or unusual colored stools (red or black). Unusual bruising for unknown reasons. A serious fall or if you hit your head (even if there is no bleeding).  Some medicines  may interact with Eliquis  and might increase your risk of bleeding or clotting while on Eliquis . To help avoid this, consult your healthcare provider or pharmacist prior to using any new prescription or non-prescription medications, including herbals, vitamins, non-steroidal anti-inflammatory drugs (NSAIDs) and supplements.  This website has more information on Eliquis  (apixaban ): [email or web address]   Follow with Timothy Mary-Margaret, FNP in 5-7 days  Please hold your olmesartan  / hydrochlorothiazide  until you see your PCP since your blood pressure here has has been normal  Please get a complete blood count and chemistry panel checked by your Primary MD at your next visit, and again as instructed by your Primary MD. Please get your medications reviewed and adjusted by your Primary MD.  Please request your Primary MD to go over all Hospital Tests and Procedure/Radiological results at the follow up, please get all Hospital records sent to your Prim MD by signing hospital release before you go home.  In some cases, there will be blood work, cultures and biopsy results pending at the time of your discharge. Please request that your primary care M.D. goes through all the records of your hospital data and follows up on these results.  If you had Pneumonia of Lung problems at the Hospital: Please get a 2 view Chest X Timothy Duran done in 6-8 weeks after hospital discharge or sooner if instructed by your Primary MD.  If you have Congestive Heart Failure: Please call your Cardiologist or Primary MD anytime you  have any of the following symptoms:  1) 3 pound weight gain in 24 hours or 5 pounds in 1 week  2) shortness of breath, with or without a dry hacking cough  3) swelling in the hands, feet or stomach  4) if you have to sleep on extra pillows at night in order to breathe  Follow cardiac low salt diet and 1.5 lit/day fluid restriction.  If you have diabetes Accuchecks 4 times/day,  Once in AM empty stomach and then before each meal. Log in all results and show them to your primary doctor at your next visit. If any glucose reading is under 80 or above 300 call your primary MD immediately.  If you have Seizure/Convulsions/Epilepsy: Please do not drive, operate heavy machinery, participate in activities at heights or participate in high speed sports until you have seen by Primary MD or a Neurologist and advised to do so again. Per [place]  DMV statutes, patients with seizures are not allowed to drive until they have been seizure-free for six months.  Use caution when using heavy equipment or power tools. Avoid working on ladders or at heights. Take showers instead of baths. Ensure the water temperature is not too high on the home water heater. Do not go swimming alone. Do not lock yourself in a room alone (i.e. bathroom). When caring for infants or small children, sit down when holding, feeding, or changing them to minimize risk of injury to the child in the event you have a seizure. Maintain good sleep hygiene. Avoid alcohol.   If you had Gastrointestinal Bleeding: Please ask your Primary MD to check a complete blood count within one week of discharge or at your next visit. Your endoscopic/colonoscopic biopsies that are pending at the time of discharge, will also need to followed by your Primary MD.  Get Medicines reviewed and adjusted. Please take all your medications with you for your next visit with your Primary MD  Please request your Primary MD to go over all hospital tests and procedure/radiological results at the follow up, please ask your Primary MD to get all Hospital records sent to his/her office.  If you experience worsening of your admission symptoms, develop shortness of breath, life threatening emergency, suicidal or homicidal thoughts you must seek medical attention immediately by calling 911 or calling your MD immediately  if symptoms less severe.  You  must read complete instructions/literature along with all the possible adverse reactions/side effects for all the Medicines you take and that have been prescribed to you. Take any new Medicines after you have completely understood and accpet all the possible adverse reactions/side effects.   Do not drive or operate heavy machinery when taking Pain medications.   Do not take more than prescribed Pain, Sleep and Anxiety Medications  Special Instructions: If you have smoked or chewed Tobacco  in the last 2 yrs please stop smoking, stop any regular Alcohol  and or any Recreational drug use.  Wear Seat belts while driving.  Please note You were cared for by a hospitalist during your hospital stay. If you have any questions about your discharge medications or the care you received while you were in the hospital after you are discharged, you can call the unit and asked to speak with the hospitalist on call if the hospitalist that took care of you is not available. Once you are discharged, your primary care physician will handle any further medical issues. Please note that NO REFILLS for any discharge medications will be  authorized once you are discharged, as it is imperative that you return to your primary care physician (or establish a relationship with a primary care physician if you do not have one) for your aftercare needs so that they can reassess your need for medications and monitor your lab values.  You can reach the hospitalist office at phone [phone] or fax [phone]   If you do not have a primary care physician, you can call [phone] for a physician referral.  Activity: As tolerated with Full fall precautions use walker/cane & assistance as needed    Diet: regular  Disposition Home

## 2024-11-05 NOTE — H&P (Signed)
 " History and Physical    CASMERE HOLLENBECK FMW:[id number] DOB: January 14, 1946 DOA: 11/04/2024  PCP: Gladis Mustard, FNP  Patient coming from: home  I have personally briefly reviewed patient's old medical records in [facility]  Chief Complaint: RUQ /flank pain   HPI: ROE WILNER is a 79 y.o. male with medical history significant of TIA, HTN, HLD,vertigo, history of prostate cancer, renal stone,GERD, history of lung cancer right side s/p resection 2010  who presents initially to Urgent care with RUQ pain as well as mid back pain that is pleuritic that is associated with coughing and upper uri symptoms. Patient notes interim history of cold 1-2 weeks ago which resolved on its own.   Patient at [facility] was noted to be tachycardic and due to concern for pneumonia was  referred to ER for further evaluation. Patient notes still has significant pain with movement and deep inspiration.   ED Course:  Vitals: afeb, bp 153/94, hr 103, rr 19, sat 93%  EKG 12 lead: poor tracing: sinus tach ( repeat pending ) Labs UA:neg  Lactic acid 1.8  Wbc 16.6, hgb 16.3,  plt 224 Na 134, K 4.2 , Cl 102, bicarb 20, gly 125 , cr 0.75  Lipase 23 Trop<15 CTPE IMPRESSION: 1. Acute pulmonary embolism in the right lower lobe. No right heart strain. 2. Bilateral lower lobe consolidation. 3. Dilatation of the ascending aorta measuring 4 cm.Recommend annual imaging followup by CTA or MRA. This recommendation follows 2010 ACCF/AHA/AATS/ACR/ASA/SCA/SCAI/SIR/STS/SVM Guidelines for the Diagnosis and Management of Patients with Thoracic Aortic Disease. Circulation. 2010; 121: Z733-z630. Aortic aneurysm NOS (ICD10-I71.9) Findings were called to Dr. Fredia at the time of exam interpretation.  CTAB IMPRESSION: 1. No acute findings in the abdomen or pelvis related to the right-sided abdominal pain. 2. Stable 7 mm nonobstructing lower pole stone in the right kidney. 3. Diverticulosis without diverticulitis.   Tx: morphine   2mg  , zofran  , dilaudid    Review of Systems: As per HPI otherwise 10 point review of systems negative.   Past Medical History:  Diagnosis Date   Abdominal hernia    in there now (05/20/2015)   Arthritis    terrible; all over (05/20/2015)   Basal cell carcinoma    left neck   Diverticulosis    Epistaxis 05/20/2015   hospitalized   Hypercholesterolemia    Hypertension    Kidney stone    Prostate cancer (HCC) 2010   prostatectomy   TIA (transient ischemic attack) 2005   ?    Past Surgical History:  Procedure Laterality Date   BASAL CELL CARCINOMA EXCISION Left 2015   neck   BLADDER STONE REMOVAL  09/2009   CHOLECYSTECTOMY N/A 05/17/2021   Procedure: LAPAROSCOPIC CHOLECYSTECTOMY;  Surgeon: Dasie Leonor CROME, MD;  Location: [facility];  Service: General;  Laterality: N/A;   CYSTOSCOPY W/ STONE MANIPULATION  1994   got one stone; still has one lodge in there (05/20/2015)   INCISION AND DRAINAGE ABSCESS Left 06/20/2015   Procedure: INCISION AND DRAINAGE ABSCESS;  Surgeon: Donaciano Sprang, MD;  Location: [facility];  Service: Orthopedics;  Laterality: Left;   KIDNEY STONE SURGERY  1995   KNEE ARTHROTOMY Left 07/20/2015   Procedure: KNEE ARTHROTOMY Incision and Drainage left knee;  Surgeon: Donnice Car, MD;  Location: [facility];  Service: Orthopedics;  Laterality: Left;   NASAL ENDOSCOPY WITH EPISTAXIS CONTROL Left 05/21/2015   Procedure: NASAL ENDOSCOPY WITH EPISTAXIS CONTROL;  Surgeon: Norleen Notice, MD;  Location: [facility];  Service: ENT;  Laterality: Left;   PROSTATECTOMY  01/2010   SHOULDER SURGERY  11/2015   torn ligament   VIDEO ASSISTED THORACOSCOPY (VATS)/WEDGE RESECTION Right 10/2009   upper; benign     reports that he quit smoking about 16 years ago. His smoking use included cigarettes. He started smoking about 20 years ago. He has a 4 pack-year smoking history. He has never used smokeless tobacco. He reports that he does not drink alcohol and does not use drugs.  Allergies[1]  Family  History  Problem Relation Age of Onset   Dementia Mother    Prostate cancer Father        eye, lung, prostate   Prostate cancer Brother    Colon cancer Sister     Prior to Admission medications  Medication Sig Start Date End Date Taking? Authorizing Provider  acetaminophen  (TYLENOL ) 325 MG tablet Take 2 tablets (650 mg total) by mouth every 6 (six) hours as needed for mild pain (or Fever >/= 101). 05/19/21   Arrien, Mauricio Daniel, MD  Cholecalciferol (VITAMIN D3 PO) Take 1 tablet by mouth daily.    [provider]  clopidogrel  (PLAVIX ) 75 MG tablet Take 1 tablet (75 mg total) by mouth daily. 06/26/24   Gladis, Mary-Margaret, FNP  MAGNESIUM PO Take 1 tablet by mouth daily.    [provider]  meclizine  (ANTIVERT ) 25 MG tablet Take 1 tablet (25 mg total) by mouth daily as needed (dizziness, vertigo). 12/23/21   [name], FNP  Multiple Vitamins-Minerals (ZINC  PO) Take 1 tablet by mouth daily.    [provider]  olmesartan -hydrochlorothiazide  (BENICAR  HCT) 20-12.5 MG tablet Take 1 tablet by mouth daily. 06/26/24   Gladis Mustard, FNP    Physical Exam: Vitals:   11/04/24 1840 11/04/24 2145 11/05/24 0027 11/05/24 0105  BP: (!) 153/94 (!) 145/82  133/81  Pulse: (!) 103 94  80  Resp: 19 18  17   Temp: 98.1 F (36.7 C) 98 F (36.7 C)  98.3 F (36.8 C)  TempSrc: Oral Oral  Oral  SpO2: 93% 92%  96%  Weight:   78 kg     Constitutional: NAD, calm, comfortable Vitals:   11/04/24 1840 11/04/24 2145 11/05/24 0027 11/05/24 0105  BP: (!) 153/94 (!) 145/82  133/81  Pulse: (!) 103 94  80  Resp: 19 18  17   Temp: 98.1 F (36.7 C) 98 F (36.7 C)  98.3 F (36.8 C)  TempSrc: Oral Oral  Oral  SpO2: 93% 92%  96%  Weight:   78 kg    Eyes: PERRL, lids and conjunctivae normal ENMT: Mucous membranes are moist. Posterior pharynx clear of any exudate or lesions.Normal dentition.  Neck: normal, supple, no masses, no thyromegaly Respiratory: dimished, no  wheezing, no crackles. Normal respiratory effort. No accessory muscle use.  Cardiovascular: Regular rate and rhythm, no murmurs / rubs / gallops. No extremity edema. 2+ pedal pulses.  Abdomen: no tenderness, no masses palpated. No hepatosplenomegaly. Bowel sounds positive.  Musculoskeletal: no clubbing / cyanosis. No joint deformity upper and lower extremities. Good ROM, no contractures. Normal muscle tone.  Skin: no rashes, lesions, ulcers. No induration Neurologic: CN 2-12 grossly intact. Sensation intact, Strength 5/5 in all 4.  Psychiatric: Normal judgment and insight. Alert and oriented x 3. Normal mood.    Labs on Admission: I have personally reviewed following labs and imaging studies  CBC: Recent Labs  Lab 11/04/24 2141  WBC 16.6*  NEUTROABS 13.0*  HGB 16.3  HCT 48.5  MCV 93.3  PLT 224   Basic Metabolic Panel: Recent Labs  Lab 11/04/24 2141  NA 134*  K 4.2  CL 102  CO2 20*  GLUCOSE 125*  BUN 17  CREATININE 0.75  CALCIUM  9.8   GFR: Estimated Creatinine Clearance: 71.3 mL/min (by C-G formula based on SCr of 0.75 mg/dL). Liver Function Tests: Recent Labs  Lab 11/04/24 2141  AST 17  ALT 16  ALKPHOS 95  BILITOT 0.6  PROT 7.1  ALBUMIN 4.1   Recent Labs  Lab 11/04/24 2141  LIPASE 23   No results for input(s): AMMONIA in the last 168 hours. Coagulation Profile: No results for input(s): INR, PROTIME in the last 168 hours. Cardiac Enzymes: No results for input(s): CKTOTAL, CKMB, CKMBINDEX, TROPONINI in the last 168 hours. BNP (last 3 results) No results for input(s): PROBNP in the last 8760 hours. HbA1C: No results for input(s): HGBA1C in the last 72 hours. CBG: No results for input(s): GLUCAP in the last 168 hours. Lipid Profile: No results for input(s): CHOL, HDL, LDLCALC, TRIG, CHOLHDL, LDLDIRECT in the last 72 hours. Thyroid  Function Tests: No results for input(s): TSH, T4TOTAL, FREET4, T3FREE, THYROIDAB in  the last 72 hours. Anemia Panel: No results for input(s): VITAMINB12, FOLATE, FERRITIN, TIBC, IRON, RETICCTPCT in the last 72 hours. Urine analysis:    Component Value Date/Time   COLORURINE YELLOW 11/04/2024 0156   APPEARANCEUR CLEAR 11/04/2024 0156   LABSPEC >1.046 (H) 11/04/2024 0156   PHURINE 5.0 11/04/2024 0156   GLUCOSEU NEGATIVE 11/04/2024 0156   HGBUR NEGATIVE 11/04/2024 0156   BILIRUBINUR NEGATIVE 11/04/2024 0156   KETONESUR 20 (A) 11/04/2024 0156   PROTEINUR NEGATIVE 11/04/2024 0156   UROBILINOGEN 0.2 10/30/2009 1055   NITRITE NEGATIVE 11/04/2024 0156   LEUKOCYTESUR NEGATIVE 11/04/2024 0156    Radiological Exams on Admission: CT ABDOMEN PELVIS W CONTRAST Result Date: 11/04/2024 EXAM: CT ABDOMEN AND PELVIS WITH CONTRAST 11/04/2024 11:24:57 PM TECHNIQUE: CT of the abdomen and pelvis was performed with the administration of 100 mL iohexol  (OMNIPAQUE ) 350 MG/ML injection. Multiplanar reformatted images are provided for review. Automated exposure control, iterative reconstruction, and/or weight-based adjustment of the mA/kV was utilized to reduce the radiation dose to as low as reasonably achievable. COMPARISON: Comparison study 05/15/2021. CLINICAL HISTORY: Right-sided abdominal pain. FINDINGS: LOWER CHEST: Lung bases show bilateral consolidation similar to that seen on prior CTA of the chest. LIVER: The liver is unremarkable. GALLBLADDER AND BILE DUCTS: The gallbladder has been surgically removed. No biliary ductal dilatation. SPLEEN: No acute abnormality. PANCREAS: Duodenal diverticulum is noted adjacent to the head of the pancreas. The pancreas appears normal. ADRENAL GLANDS: No acute abnormality. KIDNEYS, URETERS AND BLADDER: The kidneys demonstrate a normal enhancement pattern bilaterally. Scattered simple cysts are seen bilaterally. No follow-up is recommended. No obstructive changes are seen. A nonobstructing lower pole stone is noted on the right, measuring 7 mm. This  is stable in appearance from the prior exam. The bladder is partially distended. GI AND BOWEL: Stomach demonstrates no acute abnormality. Scattered diverticular change of the colon is noted. No obstructive or inflammatory changes of the colon are seen. The appendix is air-filled and within normal limits. Small bowel is unremarkable. There is no bowel obstruction. PERITONEUM AND RETROPERITONEUM: No ascites. No free air. VASCULATURE: Aorta is normal in caliber. Aortic calcifications are seen. LYMPH NODES: No lymphadenopathy. REPRODUCTIVE ORGANS: The prostate has been surgically removed. BONES AND SOFT TISSUES: No acute osseous abnormality. No focal soft tissue abnormality. IMPRESSION: 1. No acute findings in the abdomen or pelvis  related to the right-sided abdominal pain. 2. Stable 7 mm nonobstructing lower pole stone in the right kidney. 3. Diverticulosis without diverticulitis. Electronically signed by: Oneil Devonshire MD 11/04/2024 11:43 PM EST RP Workstation: [id number]   CT Angio Chest PE W/Cm &/Or Wo Cm Result Date: 11/04/2024 EXAM: CTA CHEST 11/04/2024 11:24:57 PM TECHNIQUE: CTA of the chest was performed after the administration of intravenous contrast. 100 mL iohexol  (OMNIPAQUE ) 350 MG/ML injection was administered. Multiplanar reformatted images are provided for review. MIP images are provided for review. Automated exposure control, iterative reconstruction, and/or weight based adjustment of the mA/kV was utilized to reduce the radiation dose to as low as reasonably achievable. COMPARISON: 10/14/2009 CLINICAL HISTORY: Chest and right shoulder pain. FINDINGS: PULMONARY ARTERIES: Subsegmental filling defect is noted in the right lower lobe consistent with acute pulmonary embolism. Main pulmonary artery is normal in caliber. The pulmonary artery shows a normal branching pattern bilaterally. MEDIASTINUM: The heart is at the upper limits of normal in size. No right heart strain is noted. Atherosclerotic  calcifications in the thoracic aorta are noted. Dilatation of the ascending aorta measuring 4 cm is noted. Normal tapering is noted in the distal aorta. The esophagus, as visualized, is within normal limits. LYMPH NODES: No mediastinal, hilar or axillary lymphadenopathy. LUNGS AND PLEURA: Bilateral lower lobe consolidation is noted. Postsurgical changes are noted in the right upper lobe. Previously seen right upper lobe mass is no longer identified. No parenchymal nodules are seen. No pulmonary edema. No evidence of pleural effusion or pneumothorax. UPPER ABDOMEN: Limited images of the upper abdomen are unremarkable. SOFT TISSUES AND BONES: No acute bone or soft tissue abnormality. IMPRESSION: 1. Acute pulmonary embolism in the right lower lobe. No right heart strain. 2. Bilateral lower lobe consolidation. 3. Dilatation of the ascending aorta measuring 4 cm.Recommend annual imaging followup by CTA or MRA. This recommendation follows 2010 ACCF/AHA/AATS/ACR/ASA/SCA/SCAI/SIR/STS/SVM Guidelines for the Diagnosis and Management of Patients with Thoracic Aortic Disease. Circulation. 2010; 121: Z733-z630. Aortic aneurysm NOS (ICD10-I71.9) Findings were called to Dr. Fredia at the time of exam interpretation. Electronically signed by: Oneil Devonshire MD 11/04/2024 11:38 PM EST RP Workstation: [id number]    EKG: Independently reviewed.  Assessment/Plan  Pulmonary Embolism  -admit to progressive care  - continue on apixaban   -f/u lower extremity dopplers - f/u with echo in am   CAP -ctx/ azithromycin , de-escalate as able  -pulmonary toilet  -urine ag, sputum, f/u on culture data   AAA,4cm -will need out patient follow up    TIA -hold asa while apixaban     HTN -resume home regimen once med rec completed    HLD -continue statin   History of prostate cancer -no active issue    Renal stone  -on left ,no active issue   GERD -ppi   History of lung mass - excision note mass was benign   DVT  prophylaxis: Apixaban  Code Status: full/ as discussed per patient wishes in event of cardiac arrest  Family Communication:   Trudy Plana Daughter, Emergency Contact [phone] (Mobile)   Disposition Plan: full/ as discussed per patient wishes in event of cardiac arrest  Consults called: n/a Admission status: progressive   Camila DELENA Ned MD [facility]   If 7PM-7AM, please contact night-coverage [email or web address] Password [email or web address]  11/05/2024, 2:17 AM        [1]  Allergies Allergen Reactions   Oxycodone  Other (See Comments)    Pt gets very confused and anxious with this medication   Other Other (See Comments)  Nasal packing- headaches and facial pain   Penicillins Other (See Comments)    Blisters  Patient states this was a childhood reaction   "

## 2024-11-05 NOTE — Progress Notes (Signed)
 No charge note  Patient seen and examined, admitted overnight, H&P reviewed and agree with the assessment and plan.  79 year old male with history of TIA, HTN, HLD, vertigo, prior prostate cancer, lung cancer with resection 2010 who comes into the hospital with right upper quadrant and back pain, pleuritic in nature.  Patient reports recent URI about a week ago which lasted about 2 days and resolved on its own.  He also reported some occasional lower extremity swelling, but not currently.  In the ER he was found to have acute PE in the right lower lobe without right heart strain as well as concern for bilateral lower lobe consolidation/infiltrates.  He was placed on anticoagulation, antibiotics and admitted to the hospital  Principal problem Acute pulmonary embolism-has been started on Eliquis , continue.  There is no right heart strain on CT scan but he is on supplemental oxygen this morning.  Try to wean off.  Obtain a 2D echocardiogram as well as lower extremity Dopplers  Active problems Community-acquired pneumonia-continue with antibiotics for now.  Wean off to room air.  Viral workup negative  AAA-outpatient follow-up  TIA-now on apixaban   Essential hypertension -normotensive now, hold home medications  History of prostate cancer-outpatient follow-up  Renal stone-left, no active issues  History of lung mass-status post excision, mass was benign  BP (!) 134/59   Pulse 80   Temp (!) 97.4 F (36.3 C) (Oral)   Resp (!) 24   Wt 78 kg   SpO2 95%   BMI 26.15 kg/m   Scheduled Meds:  apixaban   10 mg Oral BID   Followed by   NOREEN ON 11/12/2024] apixaban   5 mg Oral BID   azithromycin   500 mg Oral Daily   Continuous Infusions:  cefTRIAXone  (ROCEPHIN )  IV     PRN Meds:.acetaminophen , albuterol , morphine  injection, ondansetron  **OR** ondansetron  (ZOFRAN ) IV, traMADol   Prima Rayner M. Trixie, MD, PhD [facility]  Between 7 am - 7 pm you can contact me via Amion (for  emergencies) or Securechat (non urgent matters).  I am not available 7 pm - 7 am, please contact night coverage MD/APP via Amion

## 2024-11-05 NOTE — ED Notes (Signed)
 Placed new IV and attempted to draw cultures. Line gave 7ccs before signs of clotting were noted by this nurse. Ceased further attempts to draw blood off the line to preserve IV for future antibiotic infusions. All 7ccs were placed in blue culture bottle and sent to lab.

## 2024-11-05 NOTE — Progress Notes (Signed)
 Bilateral lower extremity venous duplex has been completed.  Results can be found in chart review under CV Proc.  11/05/2024 11:54 AM  Zebulan Hinshaw Elden Appl, RVT.

## 2024-11-06 ENCOUNTER — Encounter (HOSPITAL_COMMUNITY): Payer: Self-pay | Admitting: Internal Medicine

## 2024-11-06 ENCOUNTER — Other Ambulatory Visit: Payer: Self-pay

## 2024-11-06 ENCOUNTER — Other Ambulatory Visit (HOSPITAL_COMMUNITY): Payer: Self-pay

## 2024-11-06 LAB — BLOOD CULTURE ID PANEL (REFLEXED) - BCID2

## 2024-11-06 LAB — CBC
HCT: 42.7 % (ref 39.0–52.0)
Hemoglobin: 14.6 g/dL (ref 13.0–17.0)
MCH: 32.1 pg (ref 26.0–34.0)
MCHC: 34.2 g/dL (ref 30.0–36.0)
MCV: 93.8 fL (ref 80.0–100.0)
Platelets: 209 K/uL (ref 150–400)
RBC: 4.55 MIL/uL (ref 4.22–5.81)
RDW: 13.8 % (ref 11.5–15.5)
WBC: 13.3 K/uL — ABNORMAL HIGH (ref 4.0–10.5)
nRBC: 0 % (ref 0.0–0.2)

## 2024-11-06 LAB — C-REACTIVE PROTEIN: CRP: 24.3 mg/dL — ABNORMAL HIGH

## 2024-11-06 MED ORDER — CEFDINIR 300 MG PO CAPS
300.0000 mg | ORAL_CAPSULE | Freq: Two times a day (BID) | ORAL | 0 refills | Status: AC
  Filled 2024-11-06: qty 8, 4d supply, fill #0

## 2024-11-06 MED ORDER — APIXABAN (ELIQUIS) VTE STARTER PACK (10MG AND 5MG)
ORAL_TABLET | ORAL | 0 refills | Status: AC
  Filled 2024-11-06: qty 74, 28d supply, fill #0

## 2024-11-06 NOTE — Progress Notes (Signed)
 PHARMACY - PHYSICIAN COMMUNICATION CRITICAL VALUE ALERT - BLOOD CULTURE IDENTIFICATION (BCID)  Timothy Duran is an 79 y.o. male who presented to [facility] on 11/04/2024 with PE and CAP.  Assessment:  BCID + Staph epidermidis (no methicillin resistance) in 1 out of 4 bottles. Afebrile.  WBC elevated but trending down.   Name of physician (or Provider) Contacted: Isaiah Lever, NP  Current antibiotics: Ceftriaxone , Azithromycin   Changes to prescribed antibiotics recommended:  Patient is on recommended antibiotics - No changes needed - organism likely a contaminant  Results for orders placed or performed during the hospital encounter of 11/04/24  Blood Culture ID Panel (Reflexed) (Collected: 11/04/2024 11:50 PM)  Result Value Ref Range   Enterococcus faecalis NOT DETECTED NOT DETECTED   Enterococcus Faecium NOT DETECTED NOT DETECTED   Listeria monocytogenes NOT DETECTED NOT DETECTED   Staphylococcus species DETECTED (A) NOT DETECTED   Staphylococcus aureus (BCID) NOT DETECTED NOT DETECTED   Staphylococcus epidermidis DETECTED (A) NOT DETECTED   Staphylococcus lugdunensis NOT DETECTED NOT DETECTED   Streptococcus species NOT DETECTED NOT DETECTED   Streptococcus agalactiae NOT DETECTED NOT DETECTED   Streptococcus pneumoniae NOT DETECTED NOT DETECTED   Streptococcus pyogenes NOT DETECTED NOT DETECTED   A.calcoaceticus-baumannii NOT DETECTED NOT DETECTED   Bacteroides fragilis NOT DETECTED NOT DETECTED   Enterobacterales NOT DETECTED NOT DETECTED   Enterobacter cloacae complex NOT DETECTED NOT DETECTED   Escherichia coli NOT DETECTED NOT DETECTED   Klebsiella aerogenes NOT DETECTED NOT DETECTED   Klebsiella oxytoca NOT DETECTED NOT DETECTED   Klebsiella pneumoniae NOT DETECTED NOT DETECTED   Proteus species NOT DETECTED NOT DETECTED   Salmonella species NOT DETECTED NOT DETECTED   Serratia marcescens NOT DETECTED NOT DETECTED   Haemophilus influenzae NOT DETECTED NOT DETECTED    Neisseria meningitidis NOT DETECTED NOT DETECTED   Pseudomonas aeruginosa NOT DETECTED NOT DETECTED   Stenotrophomonas maltophilia NOT DETECTED NOT DETECTED   Candida albicans NOT DETECTED NOT DETECTED   Candida auris NOT DETECTED NOT DETECTED   Candida glabrata NOT DETECTED NOT DETECTED   Candida krusei NOT DETECTED NOT DETECTED   Candida parapsilosis NOT DETECTED NOT DETECTED   Candida tropicalis NOT DETECTED NOT DETECTED   Cryptococcus neoformans/gattii NOT DETECTED NOT DETECTED   Methicillin resistance mecA/C NOT DETECTED NOT DETECTED    Arvin Gauss, PharmD 11/06/2024  3:06 AM

## 2024-11-06 NOTE — Discharge Summary (Signed)
 "  Physician Discharge Summary  Timothy Duran FMW:[id number] DOB: [birth year] DOA: 11/04/2024  PCP: Gladis Mustard, FNP  Admit date: 11/04/2024 Discharge date: 11/06/2024  Admitted From: Home Disposition: Home  Recommendations for Outpatient Follow-up:  Follow up with PCP in 1-2 weeks Please repeat a chest x-Cardell in 3 to 4 weeks to ensure pneumonia resolution  Home Health: None Equipment/Devices: None  Discharge Condition: Stable CODE STATUS: Full code Diet Orders (From admission, onward)     Start     Ordered   11/05/24 0259  Diet heart healthy/carb modified Room service appropriate? Yes; Fluid consistency: Thin  Diet effective now       Question Answer Comment  Diet-HS Snack? Nothing   Room service appropriate? Yes   Fluid consistency: Thin      11/05/24 0259            HPI: 79 year old male with history of TIA, HTN, HLD, vertigo, prior prostate cancer, lung cancer with resection 2010 who comes into the hospital with right upper quadrant and back pain, pleuritic in nature. Patient reports recent URI about a week ago which lasted about 2 days and resolved on its own. He also reported some occasional lower extremity swelling, but not currently. In the ER he was found to have acute PE in the right lower lobe without right heart strain as well as concern for bilateral lower lobe consolidation/infiltrates. He was placed on anticoagulation, antibiotics and admitted to the hospital   Hospital Course / Discharge diagnoses: Principal problem Acute pulmonary embolism -patient was admitted to the hospital with shortness of breath, found to have an acute PE as well as pneumonia.  There was no evidence of right heart strain on the CT scan but initially did require 2 L of oxygen.  This was weaned off easily.  He underwent lower extremity Dopplers which were negative for DVT.  Underwent a 2D echocardiogram which showed normal LVEF 60 to 65%, no WMA, grade 1 diastolic dysfunction.  RV  systolic function and size were normal.  He was placed on Eliquis , he is feeling better, able to ambulate in the hallway without significant difficulties and will be discharged home in stable condition.   Active problems Community-acquired pneumonia-has been placed initially on ceftriaxone  and azithromycin , improving, he is afebrile and white count is improving.  He will be transition to oral agents and will finish course as an outpatient.  AAA-outpatient follow-up TIA-history of remote TIA for which he was on Plavix  for a number of years, hold DAPT since now he is on apixaban .  Recommend outpatient follow-up with PCP for further restratification and whether he would benefit from baby aspirin  Essential hypertension - History of prostate cancer-outpatient follow-up   Renal stone-left, no active issues   History of lung mass-status post excision, mass was benign  Sepsis ruled out   Discharge Instructions   Allergies as of 11/06/2024       Reactions   Oxycodone  Other (See Comments)   Pt gets very confused and anxious with this medication   Other Other (See Comments)   Nasal packing- headaches and facial pain   Penicillins Other (See Comments)   Blisters  Patient states this was a childhood reaction        Medication List     PAUSE taking these medications    olmesartan -hydrochlorothiazide  20-12.5 MG tablet Wait to take this until your doctor or other care provider tells you to start again. Commonly known as: BENICAR  HCT Take 1 tablet by  mouth daily.       STOP taking these medications    clopidogrel  75 MG tablet Commonly known as: PLAVIX        TAKE these medications    acetaminophen  325 MG tablet Commonly known as: TYLENOL  Take 2 tablets (650 mg total) by mouth every 6 (six) hours as needed for mild pain (or Fever >/= 101).   Apixaban  Starter Pack (10mg  and 5mg ) Commonly known as: ELIQUIS  STARTER PACK Take as directed on package: start with two-5mg  tablets  twice daily for 7 days. On day 8, switch to one-5mg  tablet twice daily.   cefdinir  300 MG capsule Commonly known as: OMNICEF  Take 1 capsule (300 mg total) by mouth 2 (two) times daily for 4 days.   MAGNESIUM PO Take 1 tablet by mouth daily.   meclizine  25 MG tablet Commonly known as: ANTIVERT  Take 1 tablet (25 mg total) by mouth daily as needed (dizziness, vertigo).   VITAMIN D3 PO Take 1 tablet by mouth daily.   ZINC  PO Take 1 tablet by mouth daily.        Follow-up Information     Gladis, Mary-Margaret, FNP Follow up in 1 week(s).   Specialty: Family Medicine Contact information: [address] [place] [address] [phone]                 Consultations: none  Procedures/Studies:  VAS US  LOWER EXTREMITY VENOUS (DVT) Result Date: 11/05/2024  Lower Venous DVT Study Patient Name:  Timothy Duran  Date of Exam:   11/05/2024 Medical Rec #: [id number]     Accession #:    [id number] Date of Birth: [birth year]      Patient Gender: M Patient Age:   60 years Exam Location:  [facility] Procedure:      VAS US  LOWER EXTREMITY VENOUS (DVT) Referring Phys: CAMILA NED --------------------------------------------------------------------------------  Indications: Swelling, Edema, and H/O cancer.  Comparison Study: Previous study of the left lower extremity on 8.30.2016. Performing Technologist: Edilia Elden Appl  Examination Guidelines: A complete evaluation includes B-mode imaging, spectral Doppler, color Doppler, and power Doppler as needed of all accessible portions of each vessel. Bilateral testing is considered an integral part of a complete examination. Limited examinations for reoccurring indications may be performed as noted. The reflux portion of the exam is performed with the patient in reverse Trendelenburg.  +---------+---------------+---------+-----------+----------+--------------+ RIGHT    CompressibilityPhasicitySpontaneityPropertiesThrombus  Aging +---------+---------------+---------+-----------+----------+--------------+ CFV      Full           Yes      Yes                                 +---------+---------------+---------+-----------+----------+--------------+ SFJ      Full           Yes      Yes                                 +---------+---------------+---------+-----------+----------+--------------+ FV Prox  Full                                                        +---------+---------------+---------+-----------+----------+--------------+ FV Mid   Full                                                        +---------+---------------+---------+-----------+----------+--------------+  FV DistalFull                                                        +---------+---------------+---------+-----------+----------+--------------+ PFV      Full                                                        +---------+---------------+---------+-----------+----------+--------------+ POP      Full           Yes      Yes                                 +---------+---------------+---------+-----------+----------+--------------+ PTV      Full                                                        +---------+---------------+---------+-----------+----------+--------------+ PERO     Full                                                        +---------+---------------+---------+-----------+----------+--------------+   +---------+---------------+---------+-----------+----------+--------------+ LEFT     CompressibilityPhasicitySpontaneityPropertiesThrombus Aging +---------+---------------+---------+-----------+----------+--------------+ CFV      Full           Yes      Yes                                 +---------+---------------+---------+-----------+----------+--------------+ SFJ      Full           Yes      Yes                                  +---------+---------------+---------+-----------+----------+--------------+ FV Prox  Full                                                        +---------+---------------+---------+-----------+----------+--------------+ FV Mid   Full                                                        +---------+---------------+---------+-----------+----------+--------------+ FV DistalFull                                                        +---------+---------------+---------+-----------+----------+--------------+  PFV      Full                                                        +---------+---------------+---------+-----------+----------+--------------+ POP      Full           Yes      Yes                                 +---------+---------------+---------+-----------+----------+--------------+ PTV      Full                                                        +---------+---------------+---------+-----------+----------+--------------+ PERO     Full                                                        +---------+---------------+---------+-----------+----------+--------------+     Summary: BILATERAL: - No evidence of deep vein thrombosis seen in the lower extremities, bilaterally. -No evidence of popliteal cyst, bilaterally.   *See table(s) above for measurements and observations. Electronically signed by Gaile New MD on 11/05/2024 at 4:59:55 PM.    Final    ECHOCARDIOGRAM COMPLETE Result Date: 11/05/2024    ECHOCARDIOGRAM REPORT   Patient Name:   Timothy Duran Date of Exam: 11/05/2024 Medical Rec #:  [id number]    Height:       68.0 in Accession #:    [id number]   Weight:       172.0 lb Date of Birth:  1946-04-14     BSA:          1.917 m Patient Age:    78 years     BP:           143/78 mmHg Patient Gender: M            HR:           86 bpm. Exam Location:  Inpatient Procedure: 2D Echo, Cardiac Doppler and Color Doppler (Both Spectral and Color            Flow  Doppler were utilized during procedure). Indications:    Pulmonary Embolus I26.09  History:        Patient has no prior history of Echocardiogram examinations.                 Risk Factors:Hypertension.  Sonographer:    Nathanel Devonshire Referring Phys: [id number] SARA-MAIZ A THOMAS IMPRESSIONS  1. Left ventricular ejection fraction, by estimation, is 60 to 65%. The left ventricle has normal function. The left ventricle has no regional wall motion abnormalities. Left ventricular diastolic parameters are consistent with Grade I diastolic dysfunction (impaired relaxation).  2. Right ventricular systolic function is normal. The right ventricular size is normal. Tricuspid regurgitation signal is inadequate for assessing PA pressure.  3. The mitral valve is grossly normal. Trivial mitral valve regurgitation. No evidence of mitral stenosis.  4. The aortic  valve is tricuspid. Aortic valve regurgitation is not visualized. No aortic stenosis is present. FINDINGS  Left Ventricle: Left ventricular ejection fraction, by estimation, is 60 to 65%. The left ventricle has normal function. The left ventricle has no regional wall motion abnormalities. The left ventricular internal cavity size was normal in size. There is  no left ventricular hypertrophy. Left ventricular diastolic parameters are consistent with Grade I diastolic dysfunction (impaired relaxation). Right Ventricle: The right ventricular size is normal. No increase in right ventricular wall thickness. Right ventricular systolic function is normal. Tricuspid regurgitation signal is inadequate for assessing PA pressure. Left Atrium: Left atrial size was normal in size. Right Atrium: Right atrial size was normal in size. Pericardium: There is no evidence of pericardial effusion. Mitral Valve: The mitral valve is grossly normal. Trivial mitral valve regurgitation. No evidence of mitral valve stenosis. Tricuspid Valve: The tricuspid valve is grossly normal. Tricuspid valve  regurgitation is trivial. No evidence of tricuspid stenosis. Aortic Valve: The aortic valve is tricuspid. Aortic valve regurgitation is not visualized. No aortic stenosis is present. Aortic valve mean gradient measures 7.0 mmHg. Aortic valve peak gradient measures 13.2 mmHg. Aortic valve area, by VTI measures 2.75  cm. Pulmonic Valve: The pulmonic valve was grossly normal. Pulmonic valve regurgitation is not visualized. No evidence of pulmonic stenosis. Aorta: The aortic root and ascending aorta are structurally normal, with no evidence of dilitation. Venous: The inferior vena cava was not well visualized. IAS/Shunts: The atrial septum is grossly normal.  LEFT VENTRICLE PLAX 2D LVIDd:         4.10 cm     Diastology LVIDs:         2.70 cm     LV e' medial:    5.87 cm/s LV PW:         1.00 cm     LV E/e' medial:  13.9 LV IVS:        0.90 cm     LV e' lateral:   6.09 cm/s LVOT diam:     2.20 cm     LV E/e' lateral: 13.4 LV SV:         85 LV SV Index:   44 LVOT Area:     3.80 cm LV IVRT:       106 msec  LV Volumes (MOD) LV vol d, MOD A2C: 66.8 ml LV vol d, MOD A4C: 59.4 ml LV vol s, MOD A2C: 18.3 ml LV vol s, MOD A4C: 21.5 ml LV SV MOD A2C:     48.5 ml LV SV MOD A4C:     59.4 ml LV SV MOD BP:      41.4 ml RIGHT VENTRICLE RV Basal diam:  3.20 cm TAPSE (M-mode): 2.0 cm LEFT ATRIUM           Index LA diam:      3.30 cm 1.72 cm/m LA Vol (A2C): 38.5 ml 20.09 ml/m LA Vol (A4C): 15.9 ml 8.29 ml/m  AORTIC VALVE                     PULMONIC VALVE AV Area (Vmax):    2.65 cm      PV Vmax:       1.56 m/s AV Area (Vmean):   2.44 cm      PV Peak grad:  9.7 mmHg AV Area (VTI):     2.75 cm AV Vmax:           182.00 cm/s AV Vmean:  124.000 cm/s AV VTI:            0.310 m AV Peak Grad:      13.2 mmHg AV Mean Grad:      7.0 mmHg LVOT Vmax:         127.00 cm/s LVOT Vmean:        79.700 cm/s LVOT VTI:          0.224 m LVOT/AV VTI ratio: 0.72  AORTA Ao Root diam: 3.40 cm Ao Asc diam:  2.90 cm MITRAL VALVE MV Area (PHT): 3.28  cm     SHUNTS MV E velocity: 81.40 cm/s   Systemic VTI:  0.22 m MV A velocity: 113.00 cm/s  Systemic Diam: 2.20 cm MV E/A ratio:  0.72 Darryle Decent MD Electronically signed by Darryle Decent MD Signature Date/Time: 11/05/2024/4:31:09 PM    Final    CT ABDOMEN PELVIS W CONTRAST Result Date: 11/04/2024 EXAM: CT ABDOMEN AND PELVIS WITH CONTRAST 11/04/2024 11:24:57 PM TECHNIQUE: CT of the abdomen and pelvis was performed with the administration of 100 mL iohexol  (OMNIPAQUE ) 350 MG/ML injection. Multiplanar reformatted images are provided for review. Automated exposure control, iterative reconstruction, and/or weight-based adjustment of the mA/kV was utilized to reduce the radiation dose to as low as reasonably achievable. COMPARISON: Comparison study 05/15/2021. CLINICAL HISTORY: Right-sided abdominal pain. FINDINGS: LOWER CHEST: Lung bases show bilateral consolidation similar to that seen on prior CTA of the chest. LIVER: The liver is unremarkable. GALLBLADDER AND BILE DUCTS: The gallbladder has been surgically removed. No biliary ductal dilatation. SPLEEN: No acute abnormality. PANCREAS: Duodenal diverticulum is noted adjacent to the head of the pancreas. The pancreas appears normal. ADRENAL GLANDS: No acute abnormality. KIDNEYS, URETERS AND BLADDER: The kidneys demonstrate a normal enhancement pattern bilaterally. Scattered simple cysts are seen bilaterally. No follow-up is recommended. No obstructive changes are seen. A nonobstructing lower pole stone is noted on the right, measuring 7 mm. This is stable in appearance from the prior exam. The bladder is partially distended. GI AND BOWEL: Stomach demonstrates no acute abnormality. Scattered diverticular change of the colon is noted. No obstructive or inflammatory changes of the colon are seen. The appendix is air-filled and within normal limits. Small bowel is unremarkable. There is no bowel obstruction. PERITONEUM AND RETROPERITONEUM: No ascites. No free air.  VASCULATURE: Aorta is normal in caliber. Aortic calcifications are seen. LYMPH NODES: No lymphadenopathy. REPRODUCTIVE ORGANS: The prostate has been surgically removed. BONES AND SOFT TISSUES: No acute osseous abnormality. No focal soft tissue abnormality. IMPRESSION: 1. No acute findings in the abdomen or pelvis related to the right-sided abdominal pain. 2. Stable 7 mm nonobstructing lower pole stone in the right kidney. 3. Diverticulosis without diverticulitis. Electronically signed by: Oneil Devonshire MD 11/04/2024 11:43 PM EST RP Workstation: [id number]   CT Angio Chest PE W/Cm &/Or Wo Cm Result Date: 11/04/2024 EXAM: CTA CHEST 11/04/2024 11:24:57 PM TECHNIQUE: CTA of the chest was performed after the administration of intravenous contrast. 100 mL iohexol  (OMNIPAQUE ) 350 MG/ML injection was administered. Multiplanar reformatted images are provided for review. MIP images are provided for review. Automated exposure control, iterative reconstruction, and/or weight based adjustment of the mA/kV was utilized to reduce the radiation dose to as low as reasonably achievable. COMPARISON: 10/14/2009 CLINICAL HISTORY: Chest and right shoulder pain. FINDINGS: PULMONARY ARTERIES: Subsegmental filling defect is noted in the right lower lobe consistent with acute pulmonary embolism. Main pulmonary artery is normal in caliber. The pulmonary artery shows a normal branching pattern bilaterally. MEDIASTINUM: The  heart is at the upper limits of normal in size. No right heart strain is noted. Atherosclerotic calcifications in the thoracic aorta are noted. Dilatation of the ascending aorta measuring 4 cm is noted. Normal tapering is noted in the distal aorta. The esophagus, as visualized, is within normal limits. LYMPH NODES: No mediastinal, hilar or axillary lymphadenopathy. LUNGS AND PLEURA: Bilateral lower lobe consolidation is noted. Postsurgical changes are noted in the right upper lobe. Previously seen right upper lobe mass is  no longer identified. No parenchymal nodules are seen. No pulmonary edema. No evidence of pleural effusion or pneumothorax. UPPER ABDOMEN: Limited images of the upper abdomen are unremarkable. SOFT TISSUES AND BONES: No acute bone or soft tissue abnormality. IMPRESSION: 1. Acute pulmonary embolism in the right lower lobe. No right heart strain. 2. Bilateral lower lobe consolidation. 3. Dilatation of the ascending aorta measuring 4 cm.Recommend annual imaging followup by CTA or MRA. This recommendation follows 2010 ACCF/AHA/AATS/ACR/ASA/SCA/SCAI/SIR/STS/SVM Guidelines for the Diagnosis and Management of Patients with Thoracic Aortic Disease. Circulation. 2010; 121: Z733-z630. Aortic aneurysm NOS (ICD10-I71.9) Findings were called to Dr. Fredia at the time of exam interpretation. Electronically signed by: Oneil Devonshire MD 11/04/2024 11:38 PM EST RP Workstation: [id number]     Subjective: - no chest pain, shortness of breath, no abdominal pain, nausea or vomiting.   Discharge Exam: BP 108/64 (BP Location: Right Arm)   Pulse 100   Temp 98.1 F (36.7 C) (Oral)   Resp 18   Ht 5' 8 (1.727 m)   Wt 83.6 kg   SpO2 93%   BMI 28.02 kg/m   General: Pt is alert, awake, not in acute distress Cardiovascular: RRR, S1/S2 +, no rubs, no gallops Respiratory: CTA bilaterally, no wheezing, no rhonchi Abdominal: Soft, NT, ND, bowel sounds + Extremities: no edema, no cyanosis    The results of significant diagnostics from this hospitalization (including imaging, microbiology, ancillary and laboratory) are listed below for reference.     Microbiology: Recent Results (from the past 240 hours)  Culture, blood (routine x 2)     Status: None (Preliminary result)   Collection Time: 11/04/24 11:50 PM   Specimen: BLOOD LEFT FOREARM  Result Value Ref Range Status   Specimen Description   Final    BLOOD LEFT FOREARM Performed at [facility], [address], [place], [place] [address]    Special Requests    Final    BOTTLES DRAWN AEROBIC ONLY Blood Culture results may not be optimal due to an inadequate volume of blood received in culture bottles Performed at [facility], [address], [place], [place] [address]    Culture  Setup Time   Final    AEROBIC BOTTLE ONLY CRITICAL RESULT CALLED TO, READ BACK BY AND VERIFIED WITH:  L POINTDEXTER PHARMD 11/06/2024 BY DD @ 0300 Performed at [facility], [address] [place], [place] [address]    Culture GRAM POSITIVE COCCI  Final   Report Status PENDING  Incomplete  Blood Culture ID Panel (Reflexed)     Status: Abnormal   Collection Time: 11/04/24 11:50 PM  Result Value Ref Range Status   Enterococcus faecalis NOT DETECTED NOT DETECTED Final   Enterococcus Faecium NOT DETECTED NOT DETECTED Final   Listeria monocytogenes NOT DETECTED NOT DETECTED Final   Staphylococcus species DETECTED (A) NOT DETECTED Final    Comment: CRITICAL RESULT CALLED TO, READ BACK BY AND VERIFIED WITH:  L POINTDEXTER PHARMD 11/06/2024 BY DD @ 0300  Staphylococcus aureus (BCID) NOT DETECTED NOT DETECTED Final   Staphylococcus epidermidis DETECTED (A) NOT DETECTED Final    Comment: CRITICAL RESULT CALLED TO, READ BACK BY AND VERIFIED WITH:  L POINTDEXTER PHARMD 11/06/2024 BY DD @ 0300    Staphylococcus lugdunensis NOT DETECTED NOT DETECTED Final   Streptococcus species NOT DETECTED NOT DETECTED Final   Streptococcus agalactiae NOT DETECTED NOT DETECTED Final   Streptococcus pneumoniae NOT DETECTED NOT DETECTED Final   Streptococcus pyogenes NOT DETECTED NOT DETECTED Final   A.calcoaceticus-baumannii NOT DETECTED NOT DETECTED Final   Bacteroides fragilis NOT DETECTED NOT DETECTED Final   Enterobacterales NOT DETECTED NOT DETECTED Final   Enterobacter cloacae complex NOT DETECTED NOT DETECTED Final   Escherichia coli NOT DETECTED NOT DETECTED Final   Klebsiella aerogenes NOT DETECTED NOT DETECTED Final   Klebsiella oxytoca NOT DETECTED NOT  DETECTED Final   Klebsiella pneumoniae NOT DETECTED NOT DETECTED Final   Proteus species NOT DETECTED NOT DETECTED Final   Salmonella species NOT DETECTED NOT DETECTED Final   Serratia marcescens NOT DETECTED NOT DETECTED Final   Haemophilus influenzae NOT DETECTED NOT DETECTED Final   Neisseria meningitidis NOT DETECTED NOT DETECTED Final   Pseudomonas aeruginosa NOT DETECTED NOT DETECTED Final   Stenotrophomonas maltophilia NOT DETECTED NOT DETECTED Final   Candida albicans NOT DETECTED NOT DETECTED Final   Candida auris NOT DETECTED NOT DETECTED Final   Candida glabrata NOT DETECTED NOT DETECTED Final   Candida krusei NOT DETECTED NOT DETECTED Final   Candida parapsilosis NOT DETECTED NOT DETECTED Final   Candida tropicalis NOT DETECTED NOT DETECTED Final   Cryptococcus neoformans/gattii NOT DETECTED NOT DETECTED Final   Methicillin resistance mecA/C NOT DETECTED NOT DETECTED Final    Comment: Performed at [facility], [address] [place], [place] [address]  Culture, blood (routine x 2)     Status: None (Preliminary result)   Collection Time: 11/04/24 11:58 PM   Specimen: BLOOD RIGHT HAND  Result Value Ref Range Status   Specimen Description   Final    BLOOD RIGHT HAND Performed at [facility], [address] [place], [place] [address]    Special Requests   Final    BOTTLES DRAWN AEROBIC AND ANAEROBIC Blood Culture adequate volume Performed at [facility], [address] [place], [place] [address]    Culture   Final    NO GROWTH 1 DAY Performed at [facility], [address] [place], [place] [address]    Report Status PENDING  Incomplete  Resp panel by RT-PCR (RSV, Flu A&B, Covid) Anterior Nasal Swab     Status: None   Collection Time: 11/05/24  5:35 AM   Specimen: Anterior Nasal Swab  Result Value Ref Range Status   SARS Coronavirus 2 by RT PCR NEGATIVE NEGATIVE Final    Comment: (NOTE) SARS-CoV-2 target nucleic acids are  NOT DETECTED.  The SARS-CoV-2 RNA is generally detectable in upper respiratory specimens during the acute phase of infection. The lowest concentration of SARS-CoV-2 viral copies this assay can detect is 138 copies/mL. A negative result does not preclude SARS-Cov-2 infection and should not be used as the sole basis for treatment or other patient management decisions. A negative result may occur with  improper specimen collection/handling, submission of specimen other than nasopharyngeal swab, presence of viral mutation(s) within the areas targeted by this assay, and inadequate number of viral copies(<138 copies/mL). A negative result must be combined with clinical observations, patient  history, and epidemiological information. The expected result is Negative.  Fact Sheet for Patients:  bloggercourse.com  Fact Sheet for Healthcare Providers:  seriousbroker.it  This test is no t yet approved or cleared by the [place]  FDA and  has been authorized for detection and/or diagnosis of SARS-CoV-2 by FDA under an Emergency Use Authorization (EUA). This EUA will remain  in effect (meaning this test can be used) for the duration of the COVID-19 declaration under Section 564(b)(1) of the Act, 21 U.S.C.section 360bbb-3(b)(1), unless the authorization is terminated  or revoked sooner.       Influenza A by PCR NEGATIVE NEGATIVE Final   Influenza B by PCR NEGATIVE NEGATIVE Final    Comment: (NOTE) The Xpert Xpress SARS-CoV-2/FLU/RSV plus assay is intended as an aid in the diagnosis of influenza from Nasopharyngeal swab specimens and should not be used as a sole basis for treatment. Nasal washings and aspirates are unacceptable for Xpert Xpress SARS-CoV-2/FLU/RSV testing.  Fact Sheet for Patients: bloggercourse.com  Fact Sheet for Healthcare Providers: seriousbroker.it  This test is not  yet approved or cleared by the [place]  FDA and has been authorized for detection and/or diagnosis of SARS-CoV-2 by FDA under an Emergency Use Authorization (EUA). This EUA will remain in effect (meaning this test can be used) for the duration of the COVID-19 declaration under Section 564(b)(1) of the Act, 21 U.S.C. section 360bbb-3(b)(1), unless the authorization is terminated or revoked.     Resp Syncytial Virus by PCR NEGATIVE NEGATIVE Final    Comment: (NOTE) Fact Sheet for Patients: bloggercourse.com  Fact Sheet for Healthcare Providers: seriousbroker.it  This test is not yet approved or cleared by the [place]  FDA and has been authorized for detection and/or diagnosis of SARS-CoV-2 by FDA under an Emergency Use Authorization (EUA). This EUA will remain in effect (meaning this test can be used) for the duration of the COVID-19 declaration under Section 564(b)(1) of the Act, 21 U.S.C. section 360bbb-3(b)(1), unless the authorization is terminated or revoked.  Performed at [facility], [address] [place], [place] [address]      Labs: Basic Metabolic Panel: Recent Labs  Lab 11/04/24 2141 11/05/24 0528  NA 134* 133*  K 4.2 4.7  CL 102 101  CO2 20* 23  GLUCOSE 125* 121*  BUN 17 16  CREATININE 0.75 0.80  CALCIUM  9.8 9.7   Liver Function Tests: Recent Labs  Lab 11/04/24 2141 11/05/24 0528  AST 17 17  ALT 16 13  ALKPHOS 95 87  BILITOT 0.6 0.8  PROT 7.1 6.9  ALBUMIN 4.1 3.9   CBC: Recent Labs  Lab 11/04/24 2141 11/05/24 0528 11/06/24 0343  WBC 16.6* 15.9* 13.3*  NEUTROABS 13.0*  --   --   HGB 16.3 15.3 14.6  HCT 48.5 46.3 42.7  MCV 93.3 95.1 93.8  PLT 224 205 209   CBG: No results for input(s): GLUCAP in the last 168 hours. Hgb A1c Recent Labs    11/05/24 0528  HGBA1C 5.8*   Lipid Profile No results for input(s): CHOL, HDL, LDLCALC, TRIG, CHOLHDL,  LDLDIRECT in the last 72 hours. Thyroid  function studies No results for input(s): TSH, T4TOTAL, T3FREE, THYROIDAB in the last 72 hours.  Invalid input(s): FREET3 Urinalysis    Component Value Date/Time   COLORURINE YELLOW 11/04/2024 0156   APPEARANCEUR CLEAR 11/04/2024 0156   LABSPEC >1.046 (H) 11/04/2024 0156   PHURINE 5.0 11/04/2024 0156   GLUCOSEU NEGATIVE 11/04/2024 0156   HGBUR NEGATIVE 11/04/2024 0156  BILIRUBINUR NEGATIVE 11/04/2024 0156   KETONESUR 20 (A) 11/04/2024 0156   PROTEINUR NEGATIVE 11/04/2024 0156   UROBILINOGEN 0.2 10/30/2009 1055   NITRITE NEGATIVE 11/04/2024 0156   LEUKOCYTESUR NEGATIVE 11/04/2024 0156    FURTHER DISCHARGE INSTRUCTIONS:   Get Medicines reviewed and adjusted: Please take all your medications with you for your next visit with your Primary MD   Laboratory/radiological data: Please request your Primary MD to go over all hospital tests and procedure/radiological results at the follow up, please ask your Primary MD to get all Hospital records sent to his/her office.   In some cases, they will be blood work, cultures and biopsy results pending at the time of your discharge. Please request that your primary care M.D. goes through all the records of your hospital data and follows up on these results.   Also Note the following: If you experience worsening of your admission symptoms, develop shortness of breath, life threatening emergency, suicidal or homicidal thoughts you must seek medical attention immediately by calling 911 or calling your MD immediately  if symptoms less severe.   You must read complete instructions/literature along with all the possible adverse reactions/side effects for all the Medicines you take and that have been prescribed to you. Take any new Medicines after you have completely understood and accpet all the possible adverse reactions/side effects.    Do not drive when taking Pain medications or sleeping  medications (Benzodaizepines)   Do not take more than prescribed Pain, Sleep and Anxiety Medications. It is not advisable to combine anxiety,sleep and pain medications without talking with your primary care practitioner   Special Instructions: If you have smoked or chewed Tobacco  in the last 2 yrs please stop smoking, stop any regular Alcohol  and or any Recreational drug use.   Wear Seat belts while driving.   Please note: You were cared for by a hospitalist during your hospital stay. Once you are discharged, your primary care physician will handle any further medical issues. Please note that NO REFILLS for any discharge medications will be authorized once you are discharged, as it is imperative that you return to your primary care physician (or establish a relationship with a primary care physician if you do not have one) for your post hospital discharge needs so that they can reassess your need for medications and monitor your lab values.  Time coordinating discharge: 35 minutes  SIGNED:  Nilda Fendt, MD, PhD 11/06/2024, 11:57 AM   "

## 2024-11-06 NOTE — Evaluation (Signed)
 Physical Therapy Evaluation Patient Details Name: Timothy Duran MRN: [id number] DOB: 13-Feb-1946 Today's Date: 11/06/2024  History of Present Illness  79 y.o. male  who presented initially to Urgent care with RUQ pain as well as mid back pain that is pleuritic that is associated with coughing and upper uri symptoms. Pt referred to ED, admitted with acute PE   PMH:  TIA, HTN, HLD,vertigo, history of prostate cancer, renal stone,GERD, history of lung cancer right side s/p resection 2010 , gout, septic arthritis  Clinical Impression  Pt admitted with above diagnosis. Pt doing well, motivated to work with PT. Pt is independent at baseline.  Today pt amb 360' without device, no LOB. slight DOE with SpO2= >92% on RA. During activity No further PT, DME or f/u needs at this time.      If plan is discharge home, recommend the following:     Can travel by private vehicle        Equipment Recommendations None recommended by PT  Recommendations for Other Services       Functional Status Assessment Patient has not had a recent decline in their functional status     Precautions / Restrictions        Mobility  Bed Mobility Overal bed mobility: Modified Independent                  Transfers Overall transfer level: Independent                 General transfer comment: no physical assist    Ambulation/Gait Ambulation/Gait assistance: Modified independent (Device/Increase time), Supervision Gait Distance (Feet): 360 Feet Assistive device: None, IV Pole Gait Pattern/deviations: Step-through pattern, Decreased stance time - right, Wide base of support       General Gait Details: initially pushing IV pole, transitioned to no device. steady without UE support, no LOB,. R ankle eversion during stance (pt reports baseline ankle pain/prior injuries) pt donned cowboy boots for ambulation and reports decr pain with incr distance. SpO2= >92% on RA  Stairs             Wheelchair Mobility     Tilt Bed    Modified Rankin (Stroke Patients Only)       Balance Overall balance assessment: Needs assistance Sitting-balance support: Feet supported, No upper extremity supported Sitting balance-Leahy Scale: Normal Sitting balance - Comments: able to reach laterally and fwd. dons one boot in in stting witthout assist   Standing balance support: During functional activity, No upper extremity supported Standing balance-Leahy Scale: Good Standing balance comment: dons one boot in standing                             Pertinent Vitals/Pain Pain Assessment Pain Assessment: Faces Faces Pain Scale: Hurts little more Pain Location: R ankle and shoulder, improved with activity Pain Descriptors / Indicators: Grimacing Pain Intervention(s): Limited activity within patient's tolerance, Monitored during session, Repositioned    Home Living Family/patient expects to be discharged to:: Private residence Living Arrangements: Alone Available Help at Discharge:  (--) Type of Home: House Home Access: Stairs to enter   Secretary/administrator of Steps: 2   Home Layout: One level Home Equipment: None Additional Comments: enjoys working with his horses    Prior Function Prior Level of Function : Independent/Modified Independent             Mobility Comments: ind ADLs Comments: ind     Extremity/Trunk  Assessment   Upper Extremity Assessment Upper Extremity Assessment: Overall WFL for tasks assessed    Lower Extremity Assessment Lower Extremity Assessment: Overall WFL for tasks assessed       Communication   Communication Communication: No apparent difficulties    Cognition Arousal: Alert Behavior During Therapy: WFL for tasks assessed/performed   PT - Cognitive impairments: No apparent impairments                         Following commands: Intact       Cueing Cueing Techniques: Verbal cues     General  Comments      Exercises     Assessment/Plan    PT Assessment Patient does not need any further PT services  PT Problem List         PT Treatment Interventions      PT Goals (Current goals can be found in the Care Plan section)  Acute Rehab PT Goals PT Goal Formulation: All assessment and education complete, DC therapy    Frequency       Co-evaluation               AM-PAC PT 6 Clicks Mobility  Outcome Measure Help needed turning from your back to your side while in a flat bed without using bedrails?: None Help needed moving from lying on your back to sitting on the side of a flat bed without using bedrails?: None Help needed moving to and from a bed to a chair (including a wheelchair)?: None Help needed standing up from a chair using your arms (e.g., wheelchair or bedside chair)?: None Help needed to walk in hospital room?: None Help needed climbing 3-5 steps with a railing? : None 6 Click Score: 24    End of Session   Activity Tolerance: Patient tolerated treatment well Patient left: with call bell/phone within reach;with family/visitor present;in bed Nurse Communication: Mobility status PT Visit Diagnosis: Difficulty in walking, not elsewhere classified (R26.2)    Time: 1030-1046 PT Time Calculation (min) (ACUTE ONLY): 16 min   Charges:   PT Evaluation $PT Eval Low Complexity: 1 Low   PT General Charges $$ ACUTE PT VISIT: 1 Visit         Aryn Kops, PT  Acute Rehab Dept [facility]) [phone]  11/06/2024   [facility] 11/06/2024, 11:24 AM

## 2024-11-06 NOTE — Progress Notes (Signed)
 Discharge meds in a secure bag delivered to patient by this RN

## 2024-11-07 ENCOUNTER — Telehealth: Payer: Self-pay

## 2024-11-07 LAB — CULTURE, BLOOD (ROUTINE X 2): Culture  Setup Time: NO GROWTH

## 2024-11-07 LAB — LEGIONELLA PNEUMOPHILA SEROGP 1 UR AG: L. pneumophila Serogp 1 Ur Ag: NEGATIVE

## 2024-11-07 NOTE — Transitions of Care (Post Inpatient/ED Visit) (Signed)
" ° °  11/07/2024  Name: Timothy Duran MRN: [id number] DOB: [birth year]  Today's TOC FU Call Status: Today's TOC FU Call Status:: Successful TOC FU Call Completed TOC FU Call Complete Date: 11/07/24  Patient's Name and Date of Birth confirmed. Name, DOB  Transition Care Management Follow-up Telephone Call Date of Discharge: 11/06/24 Discharge Facility: [facility]) Type of Discharge: Inpatient Admission Primary Inpatient Discharge Diagnosis:: PE How have you been since you were released from the hospital?: Better Any questions or concerns?: No  Items Reviewed: Did you receive and understand the discharge instructions provided?: Yes Medications obtained,verified, and reconciled?: Yes (Medications Reviewed) Any new allergies since your discharge?: No Dietary orders reviewed?: Yes Do you have support at home?: Yes People in Home [RPT]: grandchild(ren), child(ren), adult  Medications Reviewed Today: Medications Reviewed Today     Reviewed by Emmitt Pan, LPN (Licensed Practical Nurse) on 11/07/24 at 1014  Med List Status: <None>   Medication Order Taking? Sig Documenting Provider Last Dose Status Informant  acetaminophen  (TYLENOL ) 325 MG tablet [id number] Yes Take 2 tablets (650 mg total) by mouth every 6 (six) hours as needed for mild pain (or Fever >/= 101). Arrien, Elidia Sieving, MD  Active Self, Pharmacy Records  APIXABAN  (ELIQUIS ) VTE STARTER PACK (10MG  AND 5MG ) [id number] Yes Take as directed on package: start with two-5mg  tablets twice daily for 7 days. On day 8, switch to one-5mg  tablet twice daily. Gherghe, Costin M, MD  Active   cefdinir  (OMNICEF ) 300 MG capsule [id number] Yes Take 1 capsule (300 mg total) by mouth 2 (two) times daily for 4 days. Gherghe, Costin M, MD  Active   Cholecalciferol (VITAMIN D3 PO) [id number] Yes Take 1 tablet by mouth daily. [provider]  Active Self, Pharmacy Records  MAGNESIUM PO [id number] Yes Take 1 tablet by mouth daily. [provider]  Active Self, Pharmacy Records  meclizine  (ANTIVERT ) 25 MG tablet [id number] Yes Take 1 tablet (25 mg total) by mouth daily as needed (dizziness, vertigo). Gladis Mustard, FNP  Active Self, Pharmacy Records  Multiple Vitamins-Minerals (ZINC  PO) [id number] Yes Take 1 tablet by mouth daily. [provider]  Active Self, Pharmacy Records  olmesartan -hydrochlorothiazide  (BENICAR  HCT) 20-12.5 MG tablet [id number]  Take 1 tablet by mouth daily.  Patient not taking: Reported on 11/07/2024   Gladis Mustard, FNP  Active Self, Pharmacy Records            Home Care and Equipment/Supplies: Were Home Health Services Ordered?: NA Any new equipment or medical supplies ordered?: NA  Functional Questionnaire: Do you need assistance with bathing/showering or dressing?: No Do you need assistance with meal preparation?: No Do you need assistance with eating?: No Do you have difficulty maintaining continence: No Do you need assistance with getting out of bed/getting out of a chair/moving?: No Do you have difficulty managing or taking your medications?: No  Follow up appointments reviewed: PCP Follow-up appointment confirmed?: Yes Date of PCP follow-up appointment?: 11/16/24 Follow-up Provider: [facility] Follow-up appointment confirmed?: NA Do you need transportation to your follow-up appointment?: No Do you understand care options if your condition(s) worsen?: Yes-patient verbalized understanding    SIGNATURE Pan Emmitt, LPN [facility] Nurse Health Advisor Direct Dial [phone]  "

## 2024-11-10 LAB — CULTURE, BLOOD (ROUTINE X 2)
Culture: NO GROWTH
Special Requests: ADEQUATE

## 2024-11-16 ENCOUNTER — Ambulatory Visit: Admitting: Nurse Practitioner

## 2024-11-16 ENCOUNTER — Encounter: Payer: Self-pay | Admitting: Nurse Practitioner

## 2024-11-16 VITALS — BP 132/82 | HR 87 | Temp 97.2°F | Ht 68.0 in | Wt 189.0 lb

## 2024-11-16 DIAGNOSIS — J189 Pneumonia, unspecified organism: Secondary | ICD-10-CM

## 2024-11-16 DIAGNOSIS — Z789 Other specified health status: Secondary | ICD-10-CM

## 2024-11-16 DIAGNOSIS — I2699 Other pulmonary embolism without acute cor pulmonale: Secondary | ICD-10-CM

## 2024-11-16 DIAGNOSIS — I10 Essential (primary) hypertension: Secondary | ICD-10-CM

## 2024-11-16 NOTE — Patient Instructions (Signed)
 Blood Clot in the Lung (Pulmonary Embolism): What to Know  A pulmonary embolism (PE) is when you get a sudden blockage or less blood flow in one or both of your lungs. This typically happens when a blood clot moves into the arteries of your lung. A blood clot is blood that has turned into a gel or solid. Most blockages come from deep vein thrombosis (DVT). This is when a blood clot forms in the vein of a leg or arm and moves to your lungs. PE is dangerous. It must be treated right away. What are the causes? In most cases, PE is caused by a blood clot that forms in a vein and moves to your lungs.  In rare cases, it may be caused by: Air. Fat. Part of a tumor. A tumor is a growth of cells or tissue that isn't normal. Other tissue that moves through your veins and into your lungs. What increases the risk? You may be more likely to get PE if: You get really hurt, such as from: Breaking a hip or leg. You're also more at risk if your leg is in a cast or splint. An injury to your spinal cord. You have a big surgery, such as: A hip or knee replacement. Surgery on parts of your nervous system. Surgery on your belly. You have certain conditions, such as: A stroke. A problem with how your blood clots. A long-term lung or heart disease. You have a soft tube called a central venous catheter. You're being treated for cancer. You take medicines that have estrogen in them. You smoke. You spend a lot of time sitting, such as if you travel over 6 hours. You may also be more likely to get PE if you're: Pregnant or just had a baby. Older than 70 years of age. Overweight. Not very active. Not able to move at all. What are the signs or symptoms? Symptoms may start all of a sudden. They may include: Feeling short of breath while moving or resting. Coughing, coughing up blood, or coughing up bloody mucus. Pain that gets worse with deep breaths in your: Chest. Back. Shoulder blade. A heartbeat  that's fast or not normal. Feeling light-headed or dizzy. Fainting. Feeling worried or nervous. Pain and swelling in a leg. This is a symptom of DVT. DVT can lead to PE. How is this diagnosed? PE may be diagnosed based on your medical history, an exam, and tests. Tests may include: Blood tests. An electrocardiogram (ECG). This checks how well your heart is working. A CT pulmonary angiogram. This checks blood flow in and around your lungs. A ventilation-perfusion scan. This measures air flow and blood flow to your lungs. An ultrasound to check for a DVT. How is this treated? Treatment may depend on what caused the PE, how likely you are to have bleeding, and other conditions you have. Treatment may be done to: Stop blood clots from forming. Stop the clot from getting bigger. Break apart the clot. Remove the clot. Treatment may include: Medicines to: Stop clots from forming and growing. You may be given blood thinners. Break apart clots. Procedures, such as: An embolectomy. This uses a tube to remove a clot. Catheter-directed thrombolysis. This uses medicine to get rid of a clot. Surgical embolectomy. This is a surgery to get rid of the clot. It's used in rare cases. You may need more than one treatment. Work with your provider to choose the best treatment program for you. Follow these instructions at home: Medicines  Take your medicines only as told. If you're taking blood thinners: Take your medicines as told. Take them at the same time each day. Talk with your provider before taking any products you can buy at the store. Do not do things that could hurt or bruise you. Be careful to avoid falls. Wear an alert bracelet or carry a card that says you take blood thinners. Know what foods and other medicines you can have with your medicines. General instructions Stay at a healthy weight. Ask your provider what weight is healthy for you. Do not smoke, vape, or use nicotine or  tobacco. Do not sit or lie down for a long time without moving. Tell your provider if you plan to travel. Make sure you can still take your medicine while you travel. Ask what things are safe for you to do at home. Ask when you can go back to work or school. Keep all follow-up visits. Your provider will closely watch how you're doing. Where to find more information To learn more, go to these websites: Centers for Disease Control and Prevention at TonerPromos.no. Then: Click Health Topics A-Z. Type "blood clot" into the search box. American Lung Association (ALA): lung.org Contact a health care provider if: You miss a dose of your blood thinner. You have a fever. Get help right away if: You have: New or more pain, swelling, warmth, or redness in an arm or leg. Worse chest pain. A heartbeat that's fast or not normal. A very bad headache. Changes to your eyesight. A very bad fall or accident, or you hit your head. Blood in your pee or poop. A cut that won't stop bleeding. You feel short of breath, and it gets worse when you're moving or resting. You throw up or cough up blood. You feel light-headed or dizzy, and the feeling doesn't go away. You can't move your arms or legs. You're confused or have memory loss. These symptoms may be an emergency. Call 911 right away. Do not wait to see if the symptoms will go away. Do not drive yourself to the hospital. This information is not intended to replace advice given to you by your health care provider. Make sure you discuss any questions you have with your health care provider. Document Revised: 04/27/2023 Document Reviewed: 04/27/2023 Elsevier Patient Education  2024 [employer].

## 2024-11-16 NOTE — Progress Notes (Signed)
 "  Subjective:    Patient ID: Timothy Duran, male    DOB: 1946-07-03, 79 y.o.   MRN: [id number]   Chief Complaint: hospital follow up   HPI Today's visit was for Transitional Care Management.  The patient was discharged from [facility] on 11/06/24 with a primary diagnosis of Pulmonary emboli and pneumonia.   Contact with the patient and/or caregiver, by a clinical staff member, was made on 11/07/24 and was documented as a telephone encounter within the EMR.  Through chart review and discussion with the patient I have determined that management of their condition is of high complexity.   Patient was placed on eliquis  and discharged home. He says he is better but is still weak. He is still finishing up antibiotics from pneumonia. Still has slight cough but is better.  The hospital told him to hold is benicar  for now.  Patient Active Problem List   Diagnosis Date Noted   PE (pulmonary thromboembolism) (HCC) 11/05/2024   Acute pulmonary embolism (HCC) 11/05/2024   History of prostate cancer 06/29/2022   Mixed hyperlipidemia 02/23/2019   Vertigo 05/27/2017   BMI 28.0-28.9,adult 08/13/2015   Gastroesophageal reflux disease without esophagitis 05/13/2015   Hypertension 08/31/2013   TIA (transient ischemic attack) 08/31/2013       Review of Systems  Constitutional:  Negative for diaphoresis.  Eyes:  Negative for pain.  Respiratory:  Negative for shortness of breath.   Cardiovascular:  Negative for chest pain, palpitations and leg swelling.  Gastrointestinal:  Negative for abdominal pain.  Endocrine: Negative for polydipsia.  Skin:  Negative for rash.  Neurological:  Negative for dizziness, weakness and headaches.  Hematological:  Does not bruise/bleed easily.  All other systems reviewed and are negative.      Objective:   Physical Exam Constitutional:      Appearance: Normal appearance.  Cardiovascular:     Rate and Rhythm: Normal rate and regular rhythm.     Heart  sounds: Normal heart sounds.  Pulmonary:     Effort: Pulmonary effort is normal.     Breath sounds: Normal breath sounds.  Skin:    General: Skin is warm.  Neurological:     General: No focal deficit present.     Mental Status: He is alert and oriented to person, place, and time.  Psychiatric:        Mood and Affect: Mood normal.        Behavior: Behavior normal.     BP 132/82   Pulse 87   Temp (!) 97.2 F (36.2 C) (Temporal)   Ht 5' 8 (1.727 m)   Wt 189 lb (85.7 kg)   SpO2 93%   BMI 28.74 kg/m        Assessment & Plan:   Timothy Duran in today with chief complaint of Transitions Of Care   1. Transition of care (Primary) Hospital records reviewed  2. Pneumonia of both lower lobes due to infectious organism Will repeat chest xray in 5 weeks  3. Other acute pulmonary embolism, unspecified whether acute cor pulmonale present (HCC) Continue elquis- report any bleeding issues  4. Primary hypertension Keep diary of blood pressure at home If blood pressure starts staying above 140 systolic- can start back on benicar     The above assessment and management plan was discussed with the patient. The patient verbalized understanding of and has agreed to the management plan. Patient is aware to call the clinic if symptoms persist or worsen. Patient is aware  when to return to the clinic for a follow-up visit. Patient educated on when it is appropriate to go to the emergency department.   Mary-Margaret Gladis, FNP   "

## 2024-12-15 ENCOUNTER — Ambulatory Visit: Admitting: Nurse Practitioner

## 2025-05-17 ENCOUNTER — Ambulatory Visit: Payer: Self-pay
